# Patient Record
Sex: Female | Born: 1935 | Race: Black or African American | Hispanic: No | Smoking: Current every day smoker
Health system: Southern US, Community
[De-identification: ages and names within clinical notes are randomized; demographics above are authoritative.]

## PROBLEM LIST (undated history)

## (undated) DIAGNOSIS — N390 Urinary tract infection, site not specified: Secondary | ICD-10-CM

## (undated) DIAGNOSIS — E538 Deficiency of other specified B group vitamins: Secondary | ICD-10-CM

## (undated) DIAGNOSIS — F0150 Vascular dementia without behavioral disturbance: Secondary | ICD-10-CM

## (undated) DIAGNOSIS — D510 Vitamin B12 deficiency anemia due to intrinsic factor deficiency: Secondary | ICD-10-CM

## (undated) DIAGNOSIS — M169 Osteoarthritis of hip, unspecified: Secondary | ICD-10-CM

## (undated) DIAGNOSIS — M25569 Pain in unspecified knee: Secondary | ICD-10-CM

## (undated) DIAGNOSIS — F0280 Dementia in other diseases classified elsewhere without behavioral disturbance: Secondary | ICD-10-CM

## (undated) DIAGNOSIS — E559 Vitamin D deficiency, unspecified: Secondary | ICD-10-CM

## (undated) DIAGNOSIS — M899 Disorder of bone, unspecified: Secondary | ICD-10-CM

## (undated) DIAGNOSIS — G609 Hereditary and idiopathic neuropathy, unspecified: Secondary | ICD-10-CM

## (undated) DIAGNOSIS — E7800 Pure hypercholesterolemia, unspecified: Secondary | ICD-10-CM

## (undated) DIAGNOSIS — D72819 Decreased white blood cell count, unspecified: Secondary | ICD-10-CM

## (undated) DIAGNOSIS — E785 Hyperlipidemia, unspecified: Secondary | ICD-10-CM

## (undated) DIAGNOSIS — R42 Dizziness and giddiness: Secondary | ICD-10-CM

## (undated) DIAGNOSIS — G309 Alzheimer's disease, unspecified: Secondary | ICD-10-CM

## (undated) DIAGNOSIS — I6381 Other cerebral infarction due to occlusion or stenosis of small artery: Secondary | ICD-10-CM

## (undated) DIAGNOSIS — Z862 Personal history of diseases of the blood and blood-forming organs and certain disorders involving the immune mechanism: Secondary | ICD-10-CM

## (undated) DIAGNOSIS — M949 Disorder of cartilage, unspecified: Secondary | ICD-10-CM

## (undated) DIAGNOSIS — Z22322 Carrier or suspected carrier of Methicillin resistant Staphylococcus aureus: Secondary | ICD-10-CM

## (undated) HISTORY — DX: Dementia in other diseases classified elsewhere without behavioral disturbance: F02.80

## (undated) HISTORY — DX: Vitamin B12 deficiency anemia due to intrinsic factor deficiency: D51.0

## (undated) HISTORY — DX: Disorder of cartilage, unspecified: M94.9

## (undated) HISTORY — DX: Alzheimer's disease, unspecified: G30.9

## (undated) HISTORY — DX: Pain in unspecified knee: M25.569

## (undated) HISTORY — DX: Osteoarthritis of hip, unspecified: M16.9

## (undated) HISTORY — DX: Carrier or suspected carrier of methicillin resistant Staphylococcus aureus: Z22.322

## (undated) HISTORY — DX: Disorder of bone, unspecified: M89.9

## (undated) HISTORY — DX: Other cerebral infarction due to occlusion or stenosis of small artery: I63.81

## (undated) HISTORY — DX: Vascular dementia without behavioral disturbance: F01.50

## (undated) HISTORY — DX: Decreased white blood cell count, unspecified: D72.819

## (undated) HISTORY — DX: Urinary tract infection, site not specified: N39.0

## (undated) HISTORY — DX: Hereditary and idiopathic neuropathy, unspecified: G60.9

## (undated) HISTORY — PX: ABDOMINAL HYSTERECTOMY: SHX81

## (undated) HISTORY — DX: Hyperlipidemia, unspecified: E78.5

## (undated) HISTORY — DX: Dizziness and giddiness: R42

## (undated) HISTORY — DX: Personal history of diseases of the blood and blood-forming organs and certain disorders involving the immune mechanism: Z86.2

## (undated) HISTORY — DX: Vitamin D deficiency, unspecified: E55.9

## (undated) HISTORY — DX: Deficiency of other specified B group vitamins: E53.8

---

## 1998-11-28 ENCOUNTER — Encounter: Payer: Self-pay | Admitting: Family Medicine

## 1998-11-28 ENCOUNTER — Ambulatory Visit (HOSPITAL_COMMUNITY): Admission: RE | Admit: 1998-11-28 | Discharge: 1998-11-28 | Payer: Self-pay | Admitting: Family Medicine

## 2000-05-13 ENCOUNTER — Emergency Department (HOSPITAL_COMMUNITY): Admission: EM | Admit: 2000-05-13 | Discharge: 2000-05-13 | Payer: Self-pay | Admitting: Emergency Medicine

## 2000-05-23 ENCOUNTER — Encounter: Admission: RE | Admit: 2000-05-23 | Discharge: 2000-05-23 | Payer: Self-pay | Admitting: Family Medicine

## 2000-05-23 ENCOUNTER — Ambulatory Visit (HOSPITAL_COMMUNITY): Admission: RE | Admit: 2000-05-23 | Discharge: 2000-05-23 | Payer: Self-pay | Admitting: Family Medicine

## 2000-06-09 ENCOUNTER — Encounter: Admission: RE | Admit: 2000-06-09 | Discharge: 2000-06-09 | Payer: Self-pay | Admitting: Family Medicine

## 2000-06-12 ENCOUNTER — Encounter: Admission: RE | Admit: 2000-06-12 | Discharge: 2000-06-12 | Payer: Self-pay | Admitting: Family Medicine

## 2000-06-30 ENCOUNTER — Encounter: Admission: RE | Admit: 2000-06-30 | Discharge: 2000-06-30 | Payer: Self-pay | Admitting: Family Medicine

## 2000-09-09 ENCOUNTER — Emergency Department (HOSPITAL_COMMUNITY): Admission: EM | Admit: 2000-09-09 | Discharge: 2000-09-09 | Payer: Self-pay | Admitting: Emergency Medicine

## 2000-09-27 ENCOUNTER — Emergency Department (HOSPITAL_COMMUNITY): Admission: EM | Admit: 2000-09-27 | Discharge: 2000-09-27 | Payer: Self-pay | Admitting: Emergency Medicine

## 2000-10-06 ENCOUNTER — Encounter: Admission: RE | Admit: 2000-10-06 | Discharge: 2000-10-06 | Payer: Self-pay | Admitting: Family Medicine

## 2000-12-15 ENCOUNTER — Encounter: Admission: RE | Admit: 2000-12-15 | Discharge: 2000-12-15 | Payer: Self-pay | Admitting: Family Medicine

## 2000-12-22 ENCOUNTER — Encounter: Admission: RE | Admit: 2000-12-22 | Discharge: 2000-12-22 | Payer: Self-pay | Admitting: Family Medicine

## 2001-03-30 ENCOUNTER — Encounter: Admission: RE | Admit: 2001-03-30 | Discharge: 2001-03-30 | Payer: Self-pay | Admitting: Family Medicine

## 2001-07-30 ENCOUNTER — Encounter: Admission: RE | Admit: 2001-07-30 | Discharge: 2001-07-30 | Payer: Self-pay | Admitting: Family Medicine

## 2002-03-01 ENCOUNTER — Encounter: Admission: RE | Admit: 2002-03-01 | Discharge: 2002-03-01 | Payer: Self-pay | Admitting: Family Medicine

## 2002-03-04 ENCOUNTER — Encounter: Admission: RE | Admit: 2002-03-04 | Discharge: 2002-03-04 | Payer: Self-pay | Admitting: Family Medicine

## 2002-07-06 ENCOUNTER — Encounter: Admission: RE | Admit: 2002-07-06 | Discharge: 2002-07-06 | Payer: Self-pay | Admitting: Sports Medicine

## 2002-11-29 ENCOUNTER — Encounter: Admission: RE | Admit: 2002-11-29 | Discharge: 2002-11-29 | Payer: Self-pay | Admitting: Family Medicine

## 2002-12-06 ENCOUNTER — Encounter: Admission: RE | Admit: 2002-12-06 | Discharge: 2002-12-06 | Payer: Self-pay | Admitting: Family Medicine

## 2003-08-29 ENCOUNTER — Encounter (INDEPENDENT_AMBULATORY_CARE_PROVIDER_SITE_OTHER): Payer: Self-pay | Admitting: *Deleted

## 2003-08-29 LAB — CONVERTED CEMR LAB

## 2003-09-12 ENCOUNTER — Encounter: Admission: RE | Admit: 2003-09-12 | Discharge: 2003-09-12 | Payer: Self-pay | Admitting: Sports Medicine

## 2003-10-19 ENCOUNTER — Ambulatory Visit: Payer: Self-pay | Admitting: Family Medicine

## 2003-11-02 ENCOUNTER — Ambulatory Visit: Payer: Self-pay | Admitting: Family Medicine

## 2003-12-20 ENCOUNTER — Emergency Department (HOSPITAL_COMMUNITY): Admission: EM | Admit: 2003-12-20 | Discharge: 2003-12-20 | Payer: Self-pay | Admitting: Emergency Medicine

## 2004-02-21 ENCOUNTER — Ambulatory Visit: Payer: Self-pay | Admitting: Family Medicine

## 2004-03-20 ENCOUNTER — Ambulatory Visit: Payer: Self-pay | Admitting: Family Medicine

## 2004-03-26 ENCOUNTER — Encounter: Admission: RE | Admit: 2004-03-26 | Discharge: 2004-03-26 | Payer: Self-pay | Admitting: Sports Medicine

## 2004-04-20 ENCOUNTER — Ambulatory Visit: Payer: Self-pay | Admitting: Family Medicine

## 2004-11-24 ENCOUNTER — Encounter: Payer: Self-pay | Admitting: Emergency Medicine

## 2004-11-24 ENCOUNTER — Inpatient Hospital Stay (HOSPITAL_COMMUNITY): Admission: AD | Admit: 2004-11-24 | Discharge: 2004-11-27 | Payer: Self-pay | Admitting: Family Medicine

## 2004-11-24 ENCOUNTER — Ambulatory Visit: Payer: Self-pay | Admitting: Family Medicine

## 2004-12-06 ENCOUNTER — Ambulatory Visit: Payer: Self-pay | Admitting: Family Medicine

## 2005-03-15 ENCOUNTER — Ambulatory Visit: Payer: Self-pay | Admitting: Sports Medicine

## 2005-04-02 ENCOUNTER — Ambulatory Visit: Payer: Self-pay | Admitting: Family Medicine

## 2005-04-08 ENCOUNTER — Emergency Department (HOSPITAL_COMMUNITY): Admission: EM | Admit: 2005-04-08 | Discharge: 2005-04-08 | Payer: Self-pay | Admitting: Pediatrics

## 2005-04-09 ENCOUNTER — Ambulatory Visit: Payer: Self-pay | Admitting: Family Medicine

## 2005-04-10 ENCOUNTER — Encounter: Admission: RE | Admit: 2005-04-10 | Discharge: 2005-04-10 | Payer: Self-pay | Admitting: Family Medicine

## 2005-04-17 ENCOUNTER — Ambulatory Visit: Payer: Self-pay | Admitting: Sports Medicine

## 2006-02-05 ENCOUNTER — Encounter (INDEPENDENT_AMBULATORY_CARE_PROVIDER_SITE_OTHER): Payer: Self-pay | Admitting: *Deleted

## 2006-02-05 ENCOUNTER — Ambulatory Visit: Payer: Self-pay | Admitting: Family Medicine

## 2006-02-21 ENCOUNTER — Ambulatory Visit: Payer: Self-pay | Admitting: Family Medicine

## 2006-03-14 ENCOUNTER — Ambulatory Visit: Payer: Self-pay | Admitting: Family Medicine

## 2006-03-14 ENCOUNTER — Encounter: Payer: Self-pay | Admitting: Family Medicine

## 2006-03-14 LAB — CONVERTED CEMR LAB
ALT: 8 units/L (ref 0–35)
AST: 17 units/L (ref 0–37)
Albumin: 4.7 g/dL (ref 3.5–5.2)
Alkaline Phosphatase: 62 units/L (ref 39–117)
BUN: 13 mg/dL (ref 6–23)
CO2: 27 meq/L (ref 19–32)
Calcium: 9.5 mg/dL (ref 8.4–10.5)
Chloride: 104 meq/L (ref 96–112)
Cholesterol: 303 mg/dL — ABNORMAL HIGH (ref 0–200)
Creatinine, Ser: 0.91 mg/dL (ref 0.40–1.20)
Glucose, Bld: 80 mg/dL (ref 70–99)
HDL: 68 mg/dL (ref >39)
LDL Cholesterol: 184 mg/dL — ABNORMAL HIGH (ref 0–99)
Potassium: 4.6 meq/L (ref 3.5–5.3)
Sodium: 141 meq/L (ref 135–145)
Total Bilirubin: 0.5 mg/dL (ref 0.3–1.2)
Total CHOL/HDL Ratio: 4.5
Total Protein: 7.1 g/dL (ref 6.0–8.3)
Triglycerides: 256 mg/dL — ABNORMAL HIGH (ref <150)
VLDL: 51 mg/dL — ABNORMAL HIGH (ref 0–40)

## 2006-03-27 DIAGNOSIS — M810 Age-related osteoporosis without current pathological fracture: Secondary | ICD-10-CM

## 2006-03-27 DIAGNOSIS — M899 Disorder of bone, unspecified: Secondary | ICD-10-CM

## 2006-03-27 DIAGNOSIS — E785 Hyperlipidemia, unspecified: Secondary | ICD-10-CM | POA: Insufficient documentation

## 2006-03-27 DIAGNOSIS — G609 Hereditary and idiopathic neuropathy, unspecified: Secondary | ICD-10-CM | POA: Insufficient documentation

## 2006-03-27 DIAGNOSIS — M949 Disorder of cartilage, unspecified: Secondary | ICD-10-CM

## 2006-03-27 DIAGNOSIS — F17200 Nicotine dependence, unspecified, uncomplicated: Secondary | ICD-10-CM | POA: Insufficient documentation

## 2006-03-27 HISTORY — DX: Age-related osteoporosis without current pathological fracture: M81.0

## 2006-03-27 HISTORY — DX: Hereditary and idiopathic neuropathy, unspecified: G60.9

## 2006-03-27 HISTORY — DX: Hyperlipidemia, unspecified: E78.5

## 2006-03-27 HISTORY — DX: Disorder of bone, unspecified: M89.9

## 2006-03-28 ENCOUNTER — Encounter (INDEPENDENT_AMBULATORY_CARE_PROVIDER_SITE_OTHER): Payer: Self-pay | Admitting: *Deleted

## 2006-04-10 ENCOUNTER — Encounter: Payer: Self-pay | Admitting: Family Medicine

## 2006-04-16 ENCOUNTER — Telehealth: Payer: Self-pay | Admitting: Family Medicine

## 2006-09-18 ENCOUNTER — Telehealth (INDEPENDENT_AMBULATORY_CARE_PROVIDER_SITE_OTHER): Payer: Self-pay | Admitting: *Deleted

## 2006-09-25 ENCOUNTER — Ambulatory Visit: Payer: Self-pay | Admitting: Family Medicine

## 2007-03-11 ENCOUNTER — Ambulatory Visit: Payer: Self-pay | Admitting: Family Medicine

## 2007-05-27 ENCOUNTER — Ambulatory Visit: Payer: Self-pay | Admitting: Family Medicine

## 2007-05-27 LAB — CONVERTED CEMR LAB
Bilirubin Urine: NEGATIVE
Blood in Urine, dipstick: NEGATIVE
Glucose, Urine, Semiquant: NEGATIVE
Ketones, urine, test strip: NEGATIVE
Nitrite: POSITIVE
Protein, U semiquant: NEGATIVE
Specific Gravity, Urine: <1.005
Urobilinogen, UA: 0.2
WBC Urine, dipstick: NEGATIVE
pH: 5.5

## 2007-05-28 ENCOUNTER — Encounter: Payer: Self-pay | Admitting: Family Medicine

## 2007-08-25 ENCOUNTER — Telehealth: Payer: Self-pay | Admitting: *Deleted

## 2007-08-25 ENCOUNTER — Ambulatory Visit: Payer: Self-pay | Admitting: Family Medicine

## 2007-09-01 ENCOUNTER — Telehealth: Payer: Self-pay | Admitting: Family Medicine

## 2007-09-14 ENCOUNTER — Encounter: Payer: Self-pay | Admitting: Family Medicine

## 2007-09-14 ENCOUNTER — Ambulatory Visit: Payer: Self-pay | Admitting: Family Medicine

## 2007-09-14 LAB — CONVERTED CEMR LAB
Bilirubin Urine: NEGATIVE
Blood in Urine, dipstick: NEGATIVE
Glucose, Urine, Semiquant: NEGATIVE
Ketones, urine, test strip: NEGATIVE
Nitrite: POSITIVE
Protein, U semiquant: NEGATIVE
Specific Gravity, Urine: 1.025
Urobilinogen, UA: 0.2
pH: 5.5

## 2007-09-17 ENCOUNTER — Ambulatory Visit: Payer: Self-pay | Admitting: Family Medicine

## 2007-09-17 ENCOUNTER — Encounter: Payer: Self-pay | Admitting: Family Medicine

## 2007-09-18 ENCOUNTER — Encounter: Payer: Self-pay | Admitting: Family Medicine

## 2007-09-18 DIAGNOSIS — D539 Nutritional anemia, unspecified: Secondary | ICD-10-CM | POA: Insufficient documentation

## 2007-09-18 LAB — CONVERTED CEMR LAB
ALT: 9 units/L (ref 0–35)
AST: 20 units/L (ref 0–37)
Albumin: 4.4 g/dL (ref 3.5–5.2)
Alkaline Phosphatase: 52 units/L (ref 39–117)
BUN: 13 mg/dL (ref 6–23)
CO2: 24 meq/L (ref 19–32)
Calcium: 9.4 mg/dL (ref 8.4–10.5)
Chloride: 104 meq/L (ref 96–112)
Cholesterol: 260 mg/dL — ABNORMAL HIGH (ref 0–200)
Creatinine, Ser: 0.81 mg/dL (ref 0.40–1.20)
Glucose, Bld: 88 mg/dL (ref 70–99)
HCT: 40.8 % (ref 36.0–46.0)
HDL: 60 mg/dL (ref >39)
Hemoglobin: 13.1 g/dL (ref 12.0–15.0)
LDL Cholesterol: 166 mg/dL — ABNORMAL HIGH (ref 0–99)
MCHC: 32.1 g/dL (ref 30.0–36.0)
MCV: 103.6 fL — ABNORMAL HIGH (ref 78.0–100.0)
Platelets: 201 10*3/uL (ref 150–400)
Potassium: 4.8 meq/L (ref 3.5–5.3)
RBC: 3.94 M/uL (ref 3.87–5.11)
RDW: 13 % (ref 11.5–15.5)
Sodium: 139 meq/L (ref 135–145)
TSH: 1.437 microintl units/mL (ref 0.350–4.50)
Total Bilirubin: 0.6 mg/dL (ref 0.3–1.2)
Total CHOL/HDL Ratio: 4.3
Total Protein: 7 g/dL (ref 6.0–8.3)
Triglycerides: 170 mg/dL — ABNORMAL HIGH (ref <150)
VLDL: 34 mg/dL (ref 0–40)
WBC: 3.3 10*3/uL — ABNORMAL LOW (ref 4.0–10.5)

## 2007-09-21 ENCOUNTER — Telehealth: Payer: Self-pay | Admitting: Family Medicine

## 2007-09-21 ENCOUNTER — Encounter: Payer: Self-pay | Admitting: Family Medicine

## 2007-09-21 DIAGNOSIS — D510 Vitamin B12 deficiency anemia due to intrinsic factor deficiency: Secondary | ICD-10-CM

## 2007-09-21 HISTORY — DX: Vitamin B12 deficiency anemia due to intrinsic factor deficiency: D51.0

## 2007-09-21 LAB — CONVERTED CEMR LAB
Folate: >20 ng/mL
Vitamin B-12: 118 pg/mL — ABNORMAL LOW (ref 211–911)

## 2007-09-22 ENCOUNTER — Encounter: Payer: Self-pay | Admitting: Family Medicine

## 2007-09-23 ENCOUNTER — Ambulatory Visit: Payer: Self-pay | Admitting: Family Medicine

## 2007-09-23 ENCOUNTER — Encounter: Payer: Self-pay | Admitting: Family Medicine

## 2007-09-28 LAB — CONVERTED CEMR LAB: Vitamin B-12: 73 pg/mL — ABNORMAL LOW (ref 211–911)

## 2007-10-07 ENCOUNTER — Ambulatory Visit: Payer: Self-pay | Admitting: Family Medicine

## 2007-10-09 ENCOUNTER — Ambulatory Visit: Payer: Self-pay | Admitting: Family Medicine

## 2007-10-12 ENCOUNTER — Encounter: Payer: Self-pay | Admitting: Family Medicine

## 2007-10-12 ENCOUNTER — Ambulatory Visit: Payer: Self-pay | Admitting: Family Medicine

## 2007-11-03 ENCOUNTER — Ambulatory Visit: Payer: Self-pay | Admitting: Family Medicine

## 2007-12-04 ENCOUNTER — Ambulatory Visit: Payer: Self-pay | Admitting: Family Medicine

## 2008-01-04 ENCOUNTER — Ambulatory Visit: Payer: Self-pay | Admitting: Family Medicine

## 2008-03-22 ENCOUNTER — Ambulatory Visit: Payer: Self-pay | Admitting: Family Medicine

## 2008-04-28 ENCOUNTER — Ambulatory Visit: Payer: Self-pay | Admitting: Family Medicine

## 2008-05-30 ENCOUNTER — Ambulatory Visit: Payer: Self-pay | Admitting: Family Medicine

## 2008-06-15 ENCOUNTER — Telehealth: Payer: Self-pay | Admitting: Family Medicine

## 2008-07-01 ENCOUNTER — Ambulatory Visit: Payer: Self-pay | Admitting: Family Medicine

## 2008-08-02 ENCOUNTER — Ambulatory Visit: Payer: Self-pay | Admitting: Family Medicine

## 2008-08-31 ENCOUNTER — Ambulatory Visit: Payer: Self-pay | Admitting: Family Medicine

## 2008-10-05 ENCOUNTER — Ambulatory Visit: Payer: Self-pay | Admitting: Family Medicine

## 2008-10-26 ENCOUNTER — Ambulatory Visit: Payer: Self-pay | Admitting: Family Medicine

## 2008-11-01 ENCOUNTER — Ambulatory Visit: Payer: Self-pay | Admitting: Family Medicine

## 2008-11-16 ENCOUNTER — Ambulatory Visit: Payer: Self-pay | Admitting: Family Medicine

## 2008-12-01 ENCOUNTER — Ambulatory Visit: Payer: Self-pay | Admitting: Family Medicine

## 2008-12-06 ENCOUNTER — Encounter: Admission: RE | Admit: 2008-12-06 | Discharge: 2008-12-06 | Payer: Self-pay | Admitting: *Deleted

## 2008-12-14 ENCOUNTER — Encounter: Payer: Self-pay | Admitting: Family Medicine

## 2008-12-14 ENCOUNTER — Ambulatory Visit: Payer: Self-pay | Admitting: Family Medicine

## 2008-12-14 LAB — CONVERTED CEMR LAB
ALT: 10 units/L (ref 0–35)
AST: 17 units/L (ref 0–37)
Albumin: 4.3 g/dL (ref 3.5–5.2)
Alkaline Phosphatase: 52 units/L (ref 39–117)
BUN: 12 mg/dL (ref 6–23)
CO2: 23 meq/L (ref 19–32)
Calcium: 9.2 mg/dL (ref 8.4–10.5)
Chloride: 105 meq/L (ref 96–112)
Cholesterol: 215 mg/dL — ABNORMAL HIGH (ref 0–200)
Creatinine, Ser: 0.82 mg/dL (ref 0.40–1.20)
Glucose, Bld: 87 mg/dL (ref 70–99)
HCT: 42.7 % (ref 36.0–46.0)
HDL: 60 mg/dL (ref >39)
Hemoglobin: 13.6 g/dL (ref 12.0–15.0)
LDL Cholesterol: 121 mg/dL — ABNORMAL HIGH (ref 0–99)
MCHC: 31.9 g/dL (ref 30.0–36.0)
MCV: 88.4 fL (ref 78.0–100.0)
Platelets: 192 10*3/uL (ref 150–400)
Potassium: 4.5 meq/L (ref 3.5–5.3)
RBC: 4.83 M/uL (ref 3.87–5.11)
RDW: 12.6 % (ref 11.5–15.5)
Sodium: 141 meq/L (ref 135–145)
Total Bilirubin: 0.5 mg/dL (ref 0.3–1.2)
Total CHOL/HDL Ratio: 3.6
Total Protein: 6.7 g/dL (ref 6.0–8.3)
Triglycerides: 171 mg/dL — ABNORMAL HIGH (ref <150)
VLDL: 34 mg/dL (ref 0–40)
WBC: 3.2 10*3/uL — ABNORMAL LOW (ref 4.0–10.5)

## 2009-01-02 ENCOUNTER — Ambulatory Visit: Payer: Self-pay | Admitting: Family Medicine

## 2009-01-24 ENCOUNTER — Emergency Department (HOSPITAL_COMMUNITY): Admission: EM | Admit: 2009-01-24 | Discharge: 2009-01-24 | Payer: Self-pay | Admitting: Emergency Medicine

## 2009-02-02 ENCOUNTER — Ambulatory Visit: Payer: Self-pay | Admitting: Family Medicine

## 2009-02-02 DIAGNOSIS — M25569 Pain in unspecified knee: Secondary | ICD-10-CM | POA: Insufficient documentation

## 2009-02-02 HISTORY — DX: Pain in unspecified knee: M25.569

## 2009-03-13 ENCOUNTER — Ambulatory Visit: Payer: Self-pay

## 2009-03-22 ENCOUNTER — Ambulatory Visit: Payer: Self-pay | Admitting: Family Medicine

## 2009-03-22 DIAGNOSIS — M25519 Pain in unspecified shoulder: Secondary | ICD-10-CM | POA: Insufficient documentation

## 2009-04-05 ENCOUNTER — Ambulatory Visit: Payer: Self-pay | Admitting: Family Medicine

## 2009-10-24 ENCOUNTER — Ambulatory Visit: Payer: Self-pay | Admitting: Family Medicine

## 2009-12-06 ENCOUNTER — Encounter: Payer: Self-pay | Admitting: Family Medicine

## 2010-02-18 ENCOUNTER — Encounter: Payer: Self-pay | Admitting: Family Medicine

## 2010-03-01 NOTE — Progress Notes (Signed)
Summary: Request to speak with MD  Phone Note Call from Patient Call back at [phone]   Caller: shelby /daughter Reason for Call: Talk to Doctor Summary of Call: requesting to speak with MD, sts pt was scheduled today @ 10:30 but showed up early and pt suffers from short term memory loss, sts pt forgot to call daughter and let her know the time. Daughter was suppose to come with pt so she could discuss some things with MD and now wants MD to call her. Initial call taken by: Knox Royalty,  August 25, 2007 3:44 PM  Follow-up for Phone Call        Please notify pt's daughter that I'm out of the office until next week.  I will be glad to call her at that time.  She is also able to schedule appt to come back with her mom if she would rather talk face to face Follow-up by: Neena Rhymes  MD,  August 27, 2007 8:30 AM  Additional Follow-up for Phone Call Additional follow up Details #1::        CALLED AND LMAM TO SCHED. APPT WITH DR.TABORI Additional Follow-up by: Arlyss Repress CMA,,  August 27, 2007 5:01 PM

## 2010-03-01 NOTE — Assessment & Plan Note (Signed)
Summary: vit b12 inj/AC  Nurse Visit     Allergies: 1)  * Sulfa (Sulfonamides) Group     Medication Administration  Injection # 1:    Medication: Vit B12 1000 mcg    Diagnosis: ANEMIA, PERNICIOUS (ICD-281.0)    Route: IM    Site: L deltoid    Exp Date: 12/2009    Lot #: [id number]    Mfr: American Regent    Patient tolerated injection without complications    Given by: Jacki Cones RN (July 01, 2008 9:06 AM)  Orders Added: 1)  Vit B12 1000 mcg [J3420] 2)  Est Level 1- [facility] [[id number]]      Medication Administration  Injection # 1:    Medication: Vit B12 1000 mcg    Diagnosis: ANEMIA, PERNICIOUS (ICD-281.0)    Route: IM    Site: L deltoid    Exp Date: 12/2009    Lot #: [id number]    Mfr: American Regent    Patient tolerated injection without complications    Given by: Jacki Cones RN (July 01, 2008 9:06 AM)  Orders Added: 1)  Vit B12 1000 mcg [J3420] 2)  Est Level 1- [facility] [[id number]]

## 2010-03-01 NOTE — Assessment & Plan Note (Signed)
Summary: F/U DIZZINESS/PT OF DR. MAYANS/[facility]   Vital Signs:  Patient profile:   77 year old female Weight:      138.7 pounds Temp:     97.6 degrees F oral Pulse rate:   80 / minute BP sitting:   120 / 80  (right arm)  Vitals Entered By: Arlyss Repress CMA, (March 22, 2009 3:01 PM) CC: f/up dizziness. some better. c/o left shoulder pain radiating into left elbow x 1 week. Is Patient Diabetic? No Pain Assessment Patient in pain? yes     Location: left shoulder Intensity: 10 Onset of pain  x 1 week   Primary Care Provider:  Ardeen Garland  MD  CC:  f/up dizziness. some better. c/o left shoulder pain radiating into left elbow x 1 week.Marland Kitchen  History of Present Illness: cc: dizziness/vertigo  77 y/o F here for   -Vertigo symptoms x 2 wks.  Last time she sufferred from this was int the 1990s.  Taking Meclizine with total relief of dizziness.    -Left shoulder and arm pain x 2 wks.  Denies trauma.  Larey Seat on her bottoms in Dec 2010.  Shoulder did not hurt then.  Most painful in joint.  Dull pain.  No chest pain.  no shortness of breath.  No neck or jaw pain. Took Advil liquid gel which helped.    Habits & Providers  Alcohol-Tobacco-Diet     Tobacco Status: current     Tobacco Counseling: to quit use of tobacco products  Allergies: 1)  * Sulfa (Sulfonamides) Group  Past History:  Past Medical History: Last updated: 03/27/2006 cataract removal 6/03, hysterectomy 1971, started fish oil capusles 4-06  Past Surgical History: Last updated: 03/27/2006 lipid panel 2/08: TC=303, TG=256, HDL=68, LDL=184 - 03/17/2006, MRI-->nl 1/03 - 07/30/2001  Review of Systems       per hpi  Physical Exam  General:  Well-developed,well-nourished,in no acute distress; alert,appropriate and cooperative throughout examination Head:  normocephalic, atraumatic, no abnormalities observed, and no abnormalities palpated.   Neck:  No deformities, masses, or tenderness noted.   Shoulder/Elbow  Exam  Skin:    Intact, no scars, lesions, rashes, cafe au lait spots or bruising.    Inspection:    Inspection is normal.    Palpation:    tenderness L-AC:   Shoulder Exam:    Left:    Inspection:  Normal    Palpation:  Normal    Stability:  stable    Tenderness:  left AC joint    Swelling:  no    Erythema:  no    Full ROM of left shoulder/arm.  Tenderness from internal and external rotation.     Impression & Recommendations:  Problem # 1:  PAIN IN JOINT, SHOULDER REGION (ICD-719.41) Assessment New Left shoulder pain is most likely from bursitis vs frozen shoulder vs rotator cuff tear.  We discussed treatment options which include antiinflammatory, steroid injection, and surgery.  I do not feel that surgery is a good option and pt agreed.  Pt also agreed to try antiinflammatory.  Pt has not history of renal insuff so will try naproxen since it is longer acting than advil.  Will rtc in 2 wks if not better and we can try steroid injection. Pt amendable to this plan.  Her updated medication list for this problem includes:    Naproxen 250 Mg Tabs (Naproxen) .Marland Kitchen... 1 tab by mouth two times a day for pain  Orders: [facility]- Est Level  3 (91478)  Complete  Medication List: 1)  Hydrocortisone 2.5 % Oint (Hydrocortisone) .... Apply to affected area twice daily as needed for itching.  disp 1 large container 2)  Simvastatin 40 Mg Tabs (Simvastatin) .... One qhs 3)  Cobal-1000 1000 Mcg/ml Soln (Cyanocobalamin) .... Daily x5 then weekely x4, then monthly 4)  Doxycycline Hyclate 100 Mg Caps (Doxycycline hyclate) .Marland Kitchen.. 1 tab by mouth two times a day for 14  day. 5)  Naproxen 250 Mg Tabs (Naproxen) .Marland Kitchen.. 1 tab by mouth two times a day for pain  Patient Instructions: 1)  Please schedule a follow-up appointment in 2-3 weeks with Dr Janalyn Harder if your shoulder is still hurting.  2)  I think that your shoulder pain is due to frozen shoulder or rotator cuff strain.  I have prescribed Naproxen for you for this  pain.  You can take it twice a day. 3)  If after two weeks you still have pain we can discuss a steroid injection.  Prescriptions: NAPROXEN 250 MG TABS (NAPROXEN) 1 tab by mouth two times a day for pain  #60 x 2   Entered and Authorized by:   Angeline Slim MD   Signed by:   Angeline Slim MD on 03/22/2009   Method used:   Print then Give to Patient   RxID:   [id number] NAPROXEN 250 MG TABS (NAPROXEN) 1 tab by mouth two times a day for pain  #60 x 2   Entered and Authorized by:   Angeline Slim MD   Signed by:   Angeline Slim MD on 03/22/2009   Method used:   [facility]        [facility]  [address] [phone][facility] (retail)       [address]       [place], [place]  [id number]       Ph: [phone] or [phone]       Fax: [phone]   RxID:   [id number]

## 2010-03-01 NOTE — Progress Notes (Signed)
Summary: DISCUSSION RE MS Fritsche  Joann [name]'s daughter Joann [name] called to discuss some issues with you regarding [name]'s memory condition.  Joann [name] was  suppose to have her [name] call her when she arrive at last appt so that she could come and be in the session with her.  Daughter is very concerned about [name] developing early onset of Alzheimer's. Would like for you to call her to make arrangements to see both parties together with you to discuss her concerns.  Her number is [phone].  Home after 2:00 pm [facility]  September 01, 2007 9:21 AM  Spoke w/ Joann [name] (daughter).  She has concerns about [name]'s memory and feels like [name] isn't taking her concerns seriously.  Agree with daughter's concerns- pt seeming to have difficulty providing history and recent info where as before this was not a problem.  Notified her of geriatric appt on Thurs 8/20 at 9:00am.  Daughter appreciative and says she will be present

## 2010-03-01 NOTE — Assessment & Plan Note (Signed)
Summary: routine visit/eo   Vital Signs:  Patient Profile:   77 Years Old Female Weight:      138.2 pounds Temp:     98.3 degrees F Pulse rate:   78 / minute BP supine:   117 / 74  (right arm)  Pt. in pain?   no  Vitals Entered By: Rachael Fee RN (May 27, 2007 3:46 PM)                  Chief Complaint:  C/O OF FOUL DARK URINE X 1 WEEK and DENIES BURNING OR FREQUENCY.  History of Present Illness: 77 yo woman who c/o 1 week of dark urine which is foul smelling.  No dysuria, blood, frequency.  + urgency.  Thinks she has a 'uti'.  No suprapubic pain, CVA tenderness, fever or chills  Also c/o 'itchy spots' in the R axillary line.  No change in laundry detergent, soaps, no bug bites or rashes.  No burning or pain.    Current Allergies: * SULFA (SULFONAMIDES) GROUP   Social History:    Reviewed history from 03/27/2006 and no changes required:       Lives with a friend in [place], 2 children.  Smoked since 51 yo. Walks at least a mile every day. No etoh. Active in church.    Review of Systems      See HPI   Physical Exam  General:     Well-developed,well-nourished,in no acute distress; alert,appropriate and cooperative throughout examination Head:     Normocephalic and atraumatic without obvious abnormalities. No apparent alopecia or balding. Chest Wall:     1 sm scabbed excoriated area and 3 small hyperpigmented SKs Lungs:     Normal respiratory effort, chest expands symmetrically. Lungs slightly decreased breath sounds, no crackles or wheezes. Heart:     Normal rate and regular rhythm. S1 and S2 normal without gallop, murmur, click, rub or other extra sounds. Abdomen:     Bowel sounds positive,abdomen soft and non-tender without masses, organomegaly or hernias noted.  No CVA or suprapubic tenderness    Impression & Recommendations:  Problem # 1:  URINARY URGENCY (JXB-147.82) Assessment: New Pt's UA (+) for nitrite only.  Lots of bacteria on micro.  Will treat  as UTI w/ Keflex and send urine for cx to determine in abx need to be changed.  Advised increased fluid intake.  Pt in agreement w/ plan. Orders: Urinalysis-[facility] (00000) Urine Culture-[facility] ([id number]) [facility]- Est Level  3 (78469)   Problem # 2:  SEBORRHEIC KERATOSIS (ICD-702.19) Assessment: New Pt w/ multiple small SKs in R midaxillary line.  Not large enough to shave.  Suggested hydrocortisone cream to affected area to help w/ itching.  Pt expresses understanding and is in agreement w/ this plan. Orders: [facility]- Est Level  3 (99213)   Complete Medication List: 1)  Lidoderm 5 % Ptch (Lidocaine) .... Apply 1 patch to skin every four hours 2)  Ultram 50 Mg Tabs (Tramadol hcl) .Marland Kitchen.. 1 tab q6 as needed for pain 3)  Lidoderm 5 % Ptch (Lidocaine) .... Apply 1 patch for up to 12 hours daily, cut to appropriate size- dispense 1 box 4)  Doxycycline Hyclate 100 Mg Tabs (Doxycycline hyclate) .Marland Kitchen.. 1 by mouth two times a day for 10 days 5)  Prednisone 20 Mg Tabs (Prednisone) .... 2 by mouth daily for 10 days 6)  Keflex 250 Mg Caps (Cephalexin) .Marland Kitchen.. 1 tab by mouth three times a day x 7 days   Patient  Instructions: 1)  Follow up in 3 months 2)  Take your antibiotic 3 times a day for 7 days 3)  Drink plenty of fluids 4)  Apply Hydrocortisone cream to your itchy spots twice a day    Prescriptions: KEFLEX 250 MG  CAPS (CEPHALEXIN) 1 tab by mouth three times a day x 7 days  #21 x 0   Entered and Authorized by:   Neena Rhymes  MD   Signed by:   Neena Rhymes  MD on 05/27/2007   Method used:   Electronically sent to ...       [facility]  [address] [address] [phone]*       [address]       [place], [place]  [address]       Ph: [phone] or [phone]       Fax: [phone]   RxID:   [id number]  ] Laboratory Results   Urine Tests  Date/Time Received: May 27, 2007 4:19  PM  Date/Time Reported: May 27, 2007 4:47 PM   Routine Urinalysis     Color: yellow Appearance: Clear Glucose: negative   (Normal Range: Negative) Bilirubin: negative   (Normal Range: Negative) Ketone: negative   (Normal Range: Negative) Spec. Gravity: <1.005   (Normal Range: 1.003-1.035) Blood: negative   (Normal Range: Negative) pH: 5.5   (Normal Range: 5.0-8.0) Protein: negative   (Normal Range: Negative) Urobilinogen: 0.2   (Normal Range: 0-1) Nitrite: positive   (Normal Range: Negative) Leukocyte Esterace: negative   (Normal Range: Negative)  Urine Microscopic Bacteria/HPF: 3+ Epithelial/HPF: rare    Comments: cultured ..............test performed by......Marland KitchenBonnie A. [place], [place] (ASCP)      Appended Document: Orders Update    Clinical Lists Changes  Problems: Added new problem of UTI (ICD-599.0) Orders: Added new Test order of Miscellaneous Lab Charge-[facility] [phone]) - Signed

## 2010-03-01 NOTE — Assessment & Plan Note (Signed)
Summary: weight loss, feeling fatigued/ACM  Medications Added LIDODERM 5 % PTCH (LIDOCAINE) Apply 1 patch to skin every four hours ULTRAM 50 MG  TABS (TRAMADOL HCL) 1 tab Q6 as needed for pain LIDODERM 5 %  PTCH (LIDOCAINE) Apply 1 patch for up to 12 hours daily, cut to appropriate size- dispense 1 box      Allergies Added: * SULFA (SULFONAMIDES) GROUP  Vital Signs:  Patient Profile:   77 Years Old Female Weight:      134 pounds Temp:     97.9 degrees F Pulse rate:   75 / minute BP sitting:   115 / 76  (left arm)  Pt. in pain?   yes    Location:   right side    Intensity:   8    Type:       throbbing  Vitals Entered ByJacki Cones RN (September 25, 2006 8:41 AM)                  Chief Complaint:  losing weight and feeling dizzy.  History of Present Illness: Pt is 77 yo woman who returns to the office today after a recent worsening of her hypersthesia on L side.  Pt states pain is mostly cutaneous, occasionally deep, but doesn't effect BMs or cause N/V.  Pt has had relief w/ topical Lidoderm patches and cutaneous lidocaine injxns in the past.  Pt thinks Ultram also provided some relief but can't remember at this time.  Pt also knows she's been losing weight but wants to know how much.  She feels it may be due to the fact she walks so much- often multiple miles/day.  States her appetite is still very good, 'I eat like a horse'.  Current Allergies: * SULFA (SULFONAMIDES) GROUP   Family History:    Reviewed history from 03/27/2006 and no changes required:       brother died of lung CA 1978/06/18, Father died of MI,, mother died of kidney failure, sister died of stomach CA at 51, sister with breast ca  Social History:    Reviewed history from 03/27/2006 and no changes required:       Lives with a friend in [place], 2 children.  Smoked since 67 yo. Walks at least a mile every day. No etoh. Active in church.   Risk Factors:  Tobacco use:  current    Cigarettes:  Yes -- 1 pack per  week pack(s) per day   Review of Systems      See HPI   Physical Exam  General:     Well-developed,well-nourished,in no acute distress; alert,appropriate and cooperative throughout examination Lungs:     Normal respiratory effort, chest expands symmetrically. Lungs are clear to auscultation, no crackles or wheezes. Heart:     Normal rate and regular rhythm. S1 and S2 normal without gallop, murmur, click, rub or other extra sounds. Abdomen:     Soft, NT/ND, +BS, no hepatosplenomegaly.  No rashes or vesicles present on skin. Pulses:     +2 DP and radial Extremities:     No C/C/E    Impression & Recommendations:  Problem # 1:  ABDOMINAL PAIN (ICD-789.00) Assessment: Unchanged Pt w/ hx of parasthesias/hypersthesias of L flank/upper quadrant.  These pains have been relieved by Lidoderm patches and cutaneous lidocaine injxns in the past.  Pt describes her recent pain as similar to those past episodes.  No red flags on hx or exam.  Will treat conservatively w/ Lidoderm patches and Ultram  as needed.  Pt advised that if eating habits or bowel habits change she is to come back sooner.  Pt in agreement w/ this plan. Orders: [facility]- Est Level  3 (02725)   Problem # 2:  WEIGHT LOSS (ICD-783.21) Assessment: New Pt has lost 6 lbs in last 6 months.  Reassured her that this was not abnormal and that I am encouraged by her healthy appetite.  She denies melena or BRBPR but never followed up w/ Dr Bosie Clos for colonoscopy.  Pt's weight loss is WNL and I am not overly concerned at this point and I told her that we would follow this issue closely.  She does not have routine cancer screenings, such as mammos, pap smears, or colonoscopy and she continues to smoke 1ppd.  Pt in agreement w/ close follow up. Orders: [facility]- Est Level  3 ([id number])   Problem # 3:  TOBACCO DEPENDENCE (ICD-305.1) Assessment: Unchanged Pt has no interest in quitting at this time. Orders: [facility]- Est Level  3 (99213)   Complete  Medication List: 1)  Lidoderm 5 % Ptch (Lidocaine) .... Apply 1 patch to skin every four hours 2)  Ultram 50 Mg Tabs (Tramadol hcl) .Marland Kitchen.. 1 tab q6 as needed for pain 3)  Lidoderm 5 % Ptch (Lidocaine) .... Apply 1 patch for up to 12 hours daily, cut to appropriate size- dispense 1 box   Patient Instructions: 1)  Please schedule a follow-up appointment in 2 months. 2)  Use the Lidoderm patches and take the Ultram as needed for your side pain 3)  If your pain worsens or you are unable to eat please call the office 4)  Continue to stay active by walking 5)  Take your dizziness medication as needed 6)  Call with questions or concerns    Prescriptions: LIDODERM 5 %  PTCH (LIDOCAINE) Apply 1 patch for up to 12 hours daily, cut to appropriate size- dispense 1 box  #1 x 3   Entered and Authorized by:   Neena Rhymes  MD   Signed by:   Neena Rhymes  MD on 09/25/2006   Method used:   Electronically sent to ...       [facility] [phone]*       [address]       [place], [place]  [address]       Ph: [phone] or [phone]       Fax: [phone]   RxID:   [id number] ULTRAM 50 MG  TABS (TRAMADOL HCL) 1 tab Q6 as needed for pain  #90 x 1   Entered and Authorized by:   Neena Rhymes  MD   Signed by:   Neena Rhymes  MD on 09/25/2006   Method used:   Electronically sent to ...       [facility]  [address] [phone]*       [address]       [place], [place]  [address]       Ph: [phone] or [phone]       Fax: [phone]   RxID:   [id number]

## 2010-03-01 NOTE — Assessment & Plan Note (Signed)
Summary: b12/eo  Nurse Visit   CC: RTC for B12 injection.   Allergies: 1)  * Sulfa (Sulfonamides) Group  Medication Administration  Injection # 1:    Medication: Vit B12 1000 mcg    Diagnosis: ANEMIA, PERNICIOUS (ICD-281.0)    Route: IM    Site: R deltoid    Exp Date: 04/29/2010    Lot #: [id number]    Mfr: American Regent    Patient tolerated injection without complications    Given by: Modesta Messing LPN (August 31, 2008 9:30 AM)  Orders Added: 1)  Vit B12 1000 mcg [J3420]   Medication Administration  Injection # 1:    Medication: Vit B12 1000 mcg    Diagnosis: ANEMIA, PERNICIOUS (ICD-281.0)    Route: IM    Site: R deltoid    Exp Date: 04/29/2010    Lot #: [id number]    Mfr: American Regent    Patient tolerated injection without complications    Given by: Modesta Messing LPN (August 31, 2008 9:30 AM)  Orders Added: 1)  Vit B12 1000 mcg [J3420]

## 2010-03-01 NOTE — Assessment & Plan Note (Signed)
Summary: B12 ing,tcb  Nurse Visit      Allergies: 1)  * Sulfa (Sulfonamides) Group     Medication Administration  Injection # 1:    Medication: Vit B12 1000 mcg    Diagnosis: ANEMIA, PERNICIOUS (ICD-281.0)    Route: IM    Site: R deltoid    Exp Date: 12/2009    Lot #: [id number]    Mfr: American Regent    Patient tolerated injection without complications    Given by: Theresia Lo RN (May 30, 2008 9:36 AM)  Orders Added: 1)  Vit B12 1000 mcg [J3420] 2)  Est Level 1- [facility] [[id number]]      Medication Administration  Injection # 1:    Medication: Vit B12 1000 mcg    Diagnosis: ANEMIA, PERNICIOUS (ICD-281.0)    Route: IM    Site: R deltoid    Exp Date: 12/2009    Lot #: [id number]    Mfr: American Regent    Patient tolerated injection without complications    Given by: Theresia Lo RN (May 30, 2008 9:36 AM)  Orders Added: 1)  Vit B12 1000 mcg [J3420] 2)  Est Level 1- [facility] [11914]

## 2010-03-01 NOTE — Assessment & Plan Note (Signed)
Summary: B12 SHOT/KH  Nurse Visit   Allergies: 1)  * Sulfa (Sulfonamides) Group  Medication Administration  Injection # 1:    Medication: Vit B12 1000 mcg    Diagnosis: ANEMIA, PERNICIOUS (ICD-281.0)    Route: IM    Site: R deltoid    Exp Date: 06/2010    Lot #: [id number]    Mfr: American Regent    Patient tolerated injection without complications    Given by: Theresia Lo RN (December 01, 2008 9:37 AM)  Orders Added: 1)  Vit B12 1000 mcg [J3420] 2)  Admin of Injection (IM/SQ) [91478]   Medication Administration  Injection # 1:    Medication: Vit B12 1000 mcg    Diagnosis: ANEMIA, PERNICIOUS (ICD-281.0)    Route: IM    Site: R deltoid    Exp Date: 06/2010    Lot #: [id number]    Mfr: American Regent    Patient tolerated injection without complications    Given by: Theresia Lo RN (December 01, 2008 9:37 AM)  Orders Added: 1)  Vit B12 1000 mcg [J3420] 2)  Admin of Injection (IM/SQ) [[id number]]

## 2010-03-01 NOTE — Progress Notes (Signed)
Summary: Triage  Phone Note Call from Patient Call back at Home Phone [phone]   Caller: Patient Summary of Call: pt states that she is to come back for B12 shot and was wondering when that was.  Could not tell by notes. Please advise. Initial call taken by: Clydell Hakim,  Jun 15, 2008 4:33 PM  Follow-up for Phone Call        med list has a schedule but it does not match how often she has been coming. message to pcp to find out how often she wants her to have this Follow-up by: Golden Circle RN,  Jun 15, 2008 4:34 PM  Additional Follow-up for Phone Call Additional follow up Details #1::        she should get it monthly. Additional Follow-up by: Lamar Laundry, MD May 20th,201011:50      Appended Document: Triage pt informed. next due 06/30/08

## 2010-03-01 NOTE — Assessment & Plan Note (Signed)
Summary: FU/EO   Vital Signs:  Patient Profile:   77 Years Old Female Height:     66.75 inches Weight:      134.25 pounds Temp:     97.4 degrees F Pulse rate:   67 / minute BP sitting:   114 / 68  Pt. in pain?   no  Vitals Entered By: Dennison Nancy RN (November 03, 2007 11:12 AM)              Is Patient Diabetic? No     Chief Complaint:  Follow up visit.  History of Present Illness: Still complaining of numb fingers, not able to write or do much.  Does not remember how many B12 shots she received.  She did remember that she has injctions at last visit and she does not think that it improved.    Current Allergies: * SULFA (SULFONAMIDES) GROUP    Risk Factors:     Counseled to quit/cut down tobacco use:  yes    Physical Exam  Msk:     Normal strenght of hands, no muscle waisting.    Impression & Recommendations:  Problem # 1:  ANEMIA, PERNICIOUS (ICD-281.0) Return next month Her updated medication list for this problem includes:    Cobal-1000 1000 Mcg/ml Soln (Cyanocobalamin) .Marland Kitchen... Daily x5 then weekely x4, then monthly  Orders: Vit B12 1000 mcg (J3420) [facility]- Est Level  2 (32440)   Complete Medication List: 1)  Hydrocortisone 2.5 % Oint (Hydrocortisone) .... Apply to affected area twice daily as needed for itching.  disp 1 large container 2)  Simvastatin 40 Mg Tabs (Simvastatin) .... One qhs 3)  Cobal-1000 1000 Mcg/ml Soln (Cyanocobalamin) .... Daily x5 then weekely x4, then monthly   Patient Instructions: 1)  One month for B12 shot 2)  Make apt for mammogram on the same day,as it is accross the street.   ]    Medication Administration  Injection # 1:    Medication: Vit B12 1000 mcg    Diagnosis: ANEMIA, PERNICIOUS (ICD-281.0)    Route: IM    Site: R deltoid    Exp Date: 07/28/2009    Lot #: [id number]    Mfr: American Regent    Comments: Instructed to RTC in one month for next injection    Patient tolerated injection without complications  Given by: Dennison Nancy RN (November 03, 2007 11:39 AM)  Orders Added: 1)  Vit B12 1000 mcg [J3420] 2)  [facility]- Est Level  2 [25366]

## 2010-03-01 NOTE — Assessment & Plan Note (Signed)
Summary: F/U VISIT/[facility]   Vital Signs:  Patient profile:   77 year old female Height:      68 inches Weight:      139.38 pounds BMI:     21.27 Temp:     97.5 degrees F oral Pulse rate:   79 / minute BP sitting:   113 / 69  (left arm) CC: check-up, B12   History of Present Illness: Joann [name] comes in today for a check-up.  She is a fairly healthy 77 year female treated with B12 shots for pernicious anemia and zocor for high cholesterol.  Otherwise she is in good health.  She has no complaints today. 1) B12 - MIssed a few shots over the last few months but most recent shot was about 6 weeks ago.  Has been feeling better since starting B12 shots.   2) High Cholesterol - when asked how she was taking her simvastating, patient states she does not take such medicine.  Further discussion revealed she did not know she was to be taking a pill for high cholesterol.  Last checked 6 months ago and LDL was > 180.    Habits & Providers     Tobacco Status: current  Allergies: 1)  * Sulfa (Sulfonamides) Group  Physical Exam  General:  thin, alert, pleasant Lungs:  Normal respiratory effort, chest expands symmetrically. Lungs are clear to auscultation, no crackles or wheezes. Heart:  Normal rate and regular rhythm. S1 and S2 normal without gallop, murmur, click, rub or other extra sounds.   Impression & Recommendations:  Problem # 1:  ANEMIA, PERNICIOUS (ICD-281.0) Assessment Unchanged Continue B12 shots.  May receive one today.  Her updated medication list for this problem includes:    Cobal-1000 1000 Mcg/ml Soln (Cyanocobalamin) .Marland Kitchen... Daily x5 then weekely x4, then monthly  Orders: Vit B12 1000 mcg (J3420) [facility]- Est Level  3 (60454)  Problem # 2:  HYPERLIPIDEMIA (ICD-272.4)  Will restart simvastatin.  Gave instruction, written and verbal, to patient abou thow to take the simvastatin and to call if she has any reactions.  Her updated medication list for this problem includes:  Simvastatin 40 Mg Tabs (Simvastatin) ..... One qhs  Orders: [facility]- Est Level  3 (09811)  Complete Medication List: 1)  Hydrocortisone 2.5 % Oint (Hydrocortisone) .... Apply to affected area twice daily as needed for itching.  disp 1 large container 2)  Simvastatin 40 Mg Tabs (Simvastatin) .... One qhs 3)  Cobal-1000 1000 Mcg/ml Soln (Cyanocobalamin) .... Daily x5 then weekely x4, then monthly  Patient Instructions: 1)  Your cholesterol is too high.  I want you to start a medicine called SIMVASTATIN.  You will take one pill every night before you go to bed.  If you have any trouble from it, you can stop it but be sure to call and let me know. 2)  Please schedule a full physical at the end of July. 3)  It was very nice to meet you today. Prescriptions: SIMVASTATIN 40 MG TABS (SIMVASTATIN) one qhs  #30 x 2   Entered and Authorized by:   Ardeen Garland  MD   Signed by:   Ardeen Garland  MD on 04/28/2008   Method used:   Print then Give to Patient   RxID:   [id number]    Medication Administration  Injection # 1:    Medication: Vit B12 1000 mcg    Diagnosis: ANEMIA, PERNICIOUS (ICD-281.0)    Route: IM    Site: R deltoid  Exp Date: 12/28/2009    Lot #: [id number]    Mfr: [employer] Regent    Patient tolerated injection without complications    Given by: Modesta Messing LPN (April 28, 1608 2:34 PM)  Orders Added: 1)  Vit B12 1000 mcg [J3420] 2)  [facility]- Est Level  3 [96045]

## 2010-03-01 NOTE — Assessment & Plan Note (Signed)
Summary: b12/wp  Nurse Visit    Prior Medications: HYDROCORTISONE 2.5 %  OINT (HYDROCORTISONE) apply to affected area twice daily as needed for itching.  Disp 1 large container SIMVASTATIN 40 MG TABS (SIMVASTATIN) one qhs COBAL-1000 1000 MCG/ML SOLN (CYANOCOBALAMIN) daily X5 then weekely X4, then monthly Current Allergies: * SULFA (SULFONAMIDES) GROUP    Medication Administration  Injection # 1:    Medication: Vit B12 1000 mcg    Diagnosis: ANEMIA, PERNICIOUS (ICD-281.0)    Route: IM    Site: L deltoid    Exp Date: 09/28/2009    Lot #: [id number]    Mfr: American Regent    Patient tolerated injection without complications    Given by: Dedra Skeens CMA, (January 04, 2008 10:46 AM)  Orders Added: 1)  Admin of Therapeutic Inj  intramuscular or subcutaneous [96372] 2)  Vit B12 1000 mcg [email or web address]    ]  Medication Administration  Injection # 1:    Medication: Vit B12 1000 mcg    Diagnosis: ANEMIA, PERNICIOUS (ICD-281.0)    Route: IM    Site: L deltoid    Exp Date: 09/28/2009    Lot #: [id number]    Mfr: [employer] Regent    Patient tolerated injection without complications    Given by: Dedra Skeens CMA, (January 04, 2008 10:46 AM)  Orders Added: 1)  Admin of Therapeutic Inj  intramuscular or subcutaneous [96372] 2)  Vit B12 1000 mcg [J3420]   Complete Medication List: 1)  Hydrocortisone 2.5 % Oint (Hydrocortisone) .... Apply to affected area twice daily as needed for itching.  disp 1 large container 2)  Simvastatin 40 Mg Tabs (Simvastatin) .... One qhs 3)  Cobal-1000 1000 Mcg/ml Soln (Cyanocobalamin) .... Daily x5 then weekely x4, then monthly

## 2010-03-01 NOTE — Assessment & Plan Note (Signed)
Summary: GERI ASSESSMENT/[facility]   Vital Signs:  Patient Profile:   77 Years Old Female Height:     66.75 inches Weight:      134.2 pounds Temp:     98.4 degrees F Pulse rate:   84 / minute BP sitting:   113 / 70  (left arm)  Pt. in pain?   no  Vitals Entered By: Theresia Lo RN (September 17, 2007 8:39 AM)              Is Patient Diabetic? No     History of Present Illness: Here for memory evaluation, referred per D. Tabori.  "I haven't felt good in a good while", mostly bothered by right hand numbness.  Right hand feels numb on and off, sometimes she is not able to do fine tasks, putting on earings today was difficult.  Left hand is fine.  Does not fall, loves to walkHas intermittent vertigo, uses a OTC motion sickness product when it acts up.  Being treated for UTI on Keflex, symptoms are resolving.   Lives with female friend at [facility], one daughter in [place], and son in [place].  Daughter present during the visit.  Knows that she needs to quit smoking has smoked for 55 years.  Had full CPE less than one month ago.  Describes her health as excellent, walks everywhere she can and uses the bus if needed.  Pays own bills, shops and cools, completely self sufficient in all ADLs.     Current Allergies: * SULFA (SULFONAMIDES) GROUP   Family History:    brother died of lung CA at42    Father died of MI    mother died of kidney failure    sister died of stomach CA at 58    sister with breast ca  Social History:    Lives with a friend in [place]     2 children.      Smoked since 72 yo. Walks at least a mile every day.     No etoh.     Active in church.    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   Risk Factors:     Counseled to quit/cut down tobacco use:  yes   Review of Systems  The patient denies anorexia, weight loss, vision loss, decreased hearing, chest pain, syncope, dyspnea on exertion, peripheral edema, headaches, abdominal  pain, melena, and hematochezia.    General      Denies loss of appetite, sleep disorder, and weakness.  Resp      Denies cough, shortness of breath, and wheezing.  GI      Denies bloody stools.  GU      Denies dysuria and incontinence.  MS      Complains of joint pain and stiffness.  Derm      Complains of dryness and itching.      Denies changes in nail beds, hair loss, and rash.  Neuro      Complains of numbness, sensation of room spinning, and tingling.      Denies difficulty with concentration, disturbances in coordination, falling down, headaches, inability to speak, memory loss, poor balance, tremors, visual disturbances, and weakness.  Psych      Denies anxiety, depression, easily angered, and irritability.   Physical Exam  General:     Alert, well groomed, thin black female.  Complete CPE 3 weeks ago. Msk:     Full use of UE with full ROM,  and 5/5 strenth,  no muscle waisting or faciculations,  Neurologic:     alert & oriented X3, strength normal in all extremities, gait normal, finger-to-nose normal, and Romberg negative.      Impression & Recommendations:  Problem # 1:  MEMORY LOSS (ICD-780.93) No signs of dementia, will remove this problem from the active list.  Did have some type of perceptual deficit, found on drawing the pentagons on the MMSE.  Otherwise she scored 28/30 ( remembered 2/3 objects in 5 minutes). Orders: Comp Met-[facility] [id number]) Lipid-[facility] (09811-91478) CBC-[facility] (29562) TSH-[facility] [phone]) [facility]- Est  Level 4 (96295)   Problem # 2:  WRIST PAIN, RIGHT (ICD-719.43) Appears to be sensory in nature.  WIll check TSH and CBC. Suspect peripheral neuropathy. Orders: [facility]- Est  Level 4 (28413)   Problem # 3:  PRURITUS (ICD-698.9) Did not complain of such today. Orders: Comp Met-[facility] [id number]) Lipid-[facility] (36644-03474) CBC-[facility] (25956) TSH-[facility] [phone]) [facility]- Est  Level 4 (51884)   Problem # 4:  DYSURIA (ICD-788.1) Assessment:  Improved Completing course of antibiotics, instructed to talk full course. Her updated medication list for this problem includes:    Keflex 250 Mg Caps (Cephalexin) .Marland Kitchen... 1 tab by mouth three times a day x 7 days   Problem # 5:  TOBACCO DEPENDENCE (ICD-305.1) Encouraged to quit. Orders: [facility]- Est  Level 4 (99214)   Problem # 6:  HYPERCHOLESTEROLEMIA (ICD-272.0) Off statin for unknown reason.  Will check Lipids and restart statin.  I will call her and daughter with results and recommendations.  Complete Medication List: 1)  Keflex 250 Mg Caps (Cephalexin) .Marland Kitchen.. 1 tab by mouth three times a day x 7 days 2)  Hydrocortisone 2.5 % Oint (Hydrocortisone) .... Apply to affected area twice daily as needed for itching.  disp 1 large container   Patient Instructions: 1)  Calcium with VIt D, twice per day 2)  Multivitamin 3)  Tobacco is very bad for your health and your loved ones! You Should stop smoking!. 4)  Stop Smoking Tips: Choose a Quit date. Cut down before the Quit date. decide what you will do as a substitute when you feel the urge to smoke(gum,toothpick,exercise). 5)  Shingles Shot-call insurance supplement, find out if they will pay. 6)  Call for mammogram 7)  Stool cards to be done and returned to [facility] 8)  Apt in October with new MD 9)  Send copy of labs to daughter 42)  Shelva Hetzer 11)  [address] [place])  [place] [id number]   ]  Vital Signs:  Patient Profile:   77 Years Old Female Height:     66.75 inches Weight:      134.2 pounds Temp:     98.4 degrees F Pulse rate:   84 / minute BP sitting:   113 / 70                Last PAP:  Done. (08/29/2003 12:00:00 AM) PAP Next Due:  Not Indicated     Geriatric Assessment:  Activities of Daily Living:    Bathing-independent    Dressing-independent    Eating-independent    Toileting-independent    Transferring-independent    Continence-independent  Instrumental Activities of Daily Living:     Transportation-independent    Meal/Food Preparation-independent    Shopping Errands-independent    Housekeeping/Chores-independent    Money Management/Finances-independent    Medication Management-independent    Ability to Use Telephone-independent    Laundry-independent  Mental Status Exam: (value/max value)    Orientation  to Time: 5/5    Orientation to Place: 5/5    Registration: 3/3    Attention/Calculation: 5/5    Recall: 3/3    Language-name 2 objects: 2/2    Language-repeat: 1/1    Language-follow 3-step command: 3/3    Language-read and follow direction: 1/1    Write a sentence: 1/1    Copy design: 1/1 MSE Total score: 30/30  Geriatric Depression Score: (value/max value)    Short-15: 15/15  Balance: (value/max value)    Sitting balance: 1/1    Arises: 2/2    Attempts to arise: 2/2    Immediate standing balance: 2/2    Standing balance: 1/1    Nudged: 2/2    Eyes closed: 1/1    360 degree turn: 1/1    Sitting down: 2/2 Balance Total Score: 14/14  Gait: (value/max value)    Initiation of gait: 1/1    Step length-left: 1/1    Step height-left: 1/1    Step length-right: 1/1    Step height-right: 1/1    Step symmetry: 1/1    Step continuity: 1/1    Path: 2/2    Trunk: 2/2    Walking stance: 1/1 Gait Total Score: 12/12  Balance + Gait Total Score: 26/26   Appended Document: GERI ASSESSMENT/[facility] Wide MCV detected on CBC, B12 added and found to be low.  Dx Pernicious Anemia (H and H may be close to normal as pt is a long term smoker).  Begin B12 shots monthly.  Appended Document: GERI ASSESSMENT/[facility] sent

## 2010-03-01 NOTE — Assessment & Plan Note (Signed)
Summary: b12wp  Nurse Visit    Prior Medications: KEFLEX 250 MG  CAPS (CEPHALEXIN) 1 tab by mouth three times a day x 7 days HYDROCORTISONE 2.5 %  OINT (HYDROCORTISONE) apply to affected area twice daily as needed for itching.  Disp 1 large container SIMVASTATIN 40 MG TABS (SIMVASTATIN) one qhs Current Allergies: * SULFA (SULFONAMIDES) GROUP    Medication Administration  Injection # 1:    Medication: Vit B12 1000 mcg    Diagnosis: MACROCYTIC ANEMIA (ICD-281.9)    Route: IM    Site: R deltoid    Exp Date: 06/2009    Lot #: [id number]    Mfr: [employer] Regent    Comments: Pt states she cannot come in tomorrow for next b12 injection, but she can come Friday.  Advised pt to come in Friday, and we will complete her daily b12 injections next week, and then start them weekly.    Patient tolerated injection without complications    Given by: AMY MARTIN RN (October 07, 2007 11:40 AM)  Orders Added: 1)  Vit B12 1000 mcg [J3420] 2)  Est Level 1- [facility] [04540]    ]  Medication Administration  Injection # 1:    Medication: Vit B12 1000 mcg    Diagnosis: MACROCYTIC ANEMIA (ICD-281.9)    Route: IM    Site: R deltoid    Exp Date: 06/2009    Lot #: [id number]    Mfr: [employer] Regent    Comments: Pt states she cannot come in tomorrow for next b12 injection, but she can come Friday.  Advised pt to come in Friday, and we will complete her daily b12 injections next week, and then start them weekly.    Patient tolerated injection without complications    Given by: AMY MARTIN RN (October 07, 2007 11:40 AM)  Orders Added: 1)  Vit B12 1000 mcg [J3420] 2)  Est Level 1- [facility] [98119]

## 2010-03-01 NOTE — Assessment & Plan Note (Signed)
Summary: b12 shot/kh  Nurse Visit   Allergies: 1)  * Sulfa (Sulfonamides) Group  Medication Administration  Injection # 1:    Medication: Vit B12 1000 mcg    Diagnosis: ANEMIA, PERNICIOUS (ICD-281.0)    Route: IM    Site: L deltoid    Exp Date: 10/2010    Lot #: [id number]    Mfr: American Regent    Patient tolerated injection without complications    Given by: Theresia Lo RN (March 13, 2009 11:00 AM)  Orders Added: 1)  Vit B12 1000 mcg [J3420] 2)  Admin of Injection (IM/SQ) [16109]   Medication Administration  Injection # 1:    Medication: Vit B12 1000 mcg    Diagnosis: ANEMIA, PERNICIOUS (ICD-281.0)    Route: IM    Site: L deltoid    Exp Date: 10/2010    Lot #: [id number]    Mfr: [employer] Regent    Patient tolerated injection without complications    Given by: Theresia Lo RN (March 13, 2009 11:00 AM)  Orders Added: 1)  Vit B12 1000 mcg [J3420] 2)  Admin of Injection (IM/SQ) [60454]

## 2010-03-01 NOTE — Progress Notes (Signed)
Summary: call pt  Phone Note Call from Patient Call back at Home Phone [phone]   Caller: Patient Summary of Call: pt would like to speak to dr tabori-please call pt Initial call taken by: Dedra Skeens CMA,,  September 21, 2007 12:16 PM  Follow-up for Phone Call        Pt was confused by message indicating she would need to start a 'vitamin shot'.  Explained to her that she needs to start B12 injections and that all she needs is a nurse visit in early september to do this.  Also explained she'd be getting a letter by mail explaining this.  Pt appreciative of the info. Follow-up by: Neena Rhymes  MD,  September 21, 2007 1:04 PM

## 2010-03-01 NOTE — Miscellaneous (Signed)
Summary: Changing Prob List   Clinical Lists Changes  Problems: Removed problem of SEBACEOUS CYST (ICD-706.2) Removed problem of PHYSICAL EXAMINATION (ICD-V70.0) Removed problem of SEBORRHEIC KERATOSIS (ICD-702.19) Observations: Added new observation of PAST SURG HX: lipid panel 2/08: TC=303, TG=256, HDL=68, LDL=184 - 03/17/2006,  MRI-->nl 1/03 - 07/30/2001 (12/06/2009 22:06) Added new observation of PAST MED HX: cataract removal 6/03,  hysterectomy 1971,  started fish oil capusles 4-06 Seborrheic keratosis  (12/06/2009 22:06) Added new observation of PRIMARY MD: Cat Ta MD (12/06/2009 22:06)       Past History:  Past Medical History: cataract removal 6/03,  hysterectomy 1971,  started fish oil capusles 4-06 Seborrheic keratosis  Past Surgical History: lipid panel 2/08: TC=303, TG=256, HDL=68, LDL=184 - 03/17/2006,  MRI-->nl 1/03 - 07/30/2001

## 2010-03-01 NOTE — Assessment & Plan Note (Signed)
Summary: B12 SHOT/KH  Nurse Visit    Allergies: 1)  * Sulfa (Sulfonamides) Group  Medication Administration  Injection # 1:    Medication: Vit B12 1000 mcg    Diagnosis: ANEMIA, PERNICIOUS (ICD-281.0)    Route: IM    Site: R deltoid    Exp Date: 05/2010    Lot #: [id number]    Mfr: American Regent    Patient tolerated injection without complications    Given by: Theresia Lo RN (October 05, 2008 9:39 AM)  Orders Added: 1)  Admin of Injection (IM/SQ) [04540]   Medication Administration  Injection # 1:    Medication: Vit B12 1000 mcg    Diagnosis: ANEMIA, PERNICIOUS (ICD-281.0)    Route: IM    Site: R deltoid    Exp Date: 05/2010    Lot #: [id number]    Mfr: American Regent    Patient tolerated injection without complications    Given by: Theresia Lo RN (October 05, 2008 9:39 AM)  Orders Added: 1)  Admin of Injection (IM/SQ) [98119]

## 2010-03-01 NOTE — Assessment & Plan Note (Signed)
Summary: 2 wk f/u,df   Vital Signs:  Patient profile:   77 year old female Height:      67.0 inches Weight:      141 pounds BMI:     22.16 Temp:     97.8 degrees F oral Pulse rate:   85 / minute BP sitting:   108 / 67  (left arm) Cuff size:   regular  Vitals Entered By: Tessie Fass CMA (April 05, 2009 1:49 PM) CC: F/U left shoulder and elbow pain Is Patient Diabetic? No Pain Assessment Patient in pain? no        Primary Care Provider:  Ardeen Garland  MD  CC:  F/U left shoulder and elbow pain.  History of Present Illness: 77 y/o F here for f/u of Left Shoulder pain: better since last visit.  taking naproxen 250mg  two times a day. Taking med without discomfort.  Shoulder pain does not limit her activity.   Habits & Providers  Alcohol-Tobacco-Diet     Tobacco Status: current  Current Medications (verified): 1)  Hydrocortisone 2.5 %  Oint (Hydrocortisone) .... Apply To Affected Area Twice Daily As Needed For Itching.  Disp 1 Large Container 2)  Simvastatin 40 Mg Tabs (Simvastatin) .... One Qhs 3)  Cobal-1000 1000 Mcg/ml Soln (Cyanocobalamin) .... Daily X5 Then Weekely X4, Then Monthly 4)  Naproxen 250 Mg Tabs (Naproxen) .Marland Kitchen.. 1 Tab By Mouth Two Times A Day For Pain  Allergies (verified): 1)  * Sulfa (Sulfonamides) Group  Social History: Lives with a friend in [place]  2 children.   Smoked since 71 yo. Smokes <1 pack per week.   Walks at least a mile every day.  No etoh.  Active in church. 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  Review of Systems MS:  Complains of joint pain; denies loss of strength, low back pain, and stiffness.  Physical Exam  General:  Well-developed,well-nourished,in no acute distress; alert,appropriate and cooperative throughout examination. vitals reviewed.    Shoulder/Elbow Exam  Skin:    Intact, no scars, lesions, rashes, cafe au lait spots or bruising.    Inspection:    Inspection is normal.      Palpation:    Non-tender to palpation bilaterally.    Vascular:    Radial, ulnar, brachial, and axillary pulses 2+ and symmetric; capillary refill less than 2 seconds; no evidence of ischemia, clubbing, or cyanosis.    Shoulder Exam:    Left:    Inspection:  Normal    Palpation:  Normal    Stability:  stable    Tenderness:  no    Swelling:  no    Erythema:  no    Shoulder: Inspection reveals no abnormalities, atrophy or asymmetry. Palpation is normal with no tenderness over AC joint or bicipital groove. ROM is full in all planes. Rotator cuff strength normal throughout. No signs of impingement with negative Neer and Hawkin's tests, empty can. Speeds and Yergason's tests normal. No labral pathology noted with negative Obrien's, negative clunk and good stability. Normal scapular function observed. No painful arc and no drop arm sign. No apprehension sign      Impression & Recommendations:  Problem # 1:  PAIN IN JOINT, SHOULDER REGION (ICD-719.41) Assessment Improved  Shoulder pain resolved.  Cont Naproxen as prescribed.  Her updated medication list for this problem includes:    Naproxen 250 Mg Tabs (Naproxen) .Marland Kitchen... 1 tab by mouth two times a day for pain  Orders:  [facility]- Est Level  3 (16109)  Problem # 2:  TOBACCO DEPENDENCE (ICD-305.1) NOt interested in quitting.  We discussed smoking cessation.   Complete Medication List: 1)  Hydrocortisone 2.5 % Oint (Hydrocortisone) .... Apply to affected area twice daily as needed for itching.  disp 1 large container 2)  Simvastatin 40 Mg Tabs (Simvastatin) .... One qhs 3)  Cobal-1000 1000 Mcg/ml Soln (Cyanocobalamin) .... Daily x5 then weekely x4, then monthly 4)  Naproxen 250 Mg Tabs (Naproxen) .Marland Kitchen.. 1 tab by mouth two times a day for pain  Patient Instructions: 1)  Please schedule a follow-up appointment as needed with Dr Georgiana Shore or Dr Janalyn Harder.  2)  Continue taking Naproxen two times a day for shoulder pain. 3)  Tobacco is very  bad for your health and your loved ones ! You should stop smoking !  4)  Stop smoking tips: Choose a quit date. Cut down before the quit date. Decide what you will do as a substitute when you feel the urge to smoke(gum, toothpick, exercise).    Prevention & Chronic Care Immunizations   Influenza vaccine: Not documented   Influenza vaccine deferral: Deferred  (04/05/2009)    Tetanus booster: 11/29/2002: Done.   Tetanus booster due: 11/28/2012    Pneumococcal vaccine: Done.  (11/29/2002)   Pneumococcal vaccine due: None    H. zoster vaccine: Not documented  Colorectal Screening   Hemoccult: Done.  (01/29/2004)   Hemoccult due: 01/28/2005    Colonoscopy: Not documented  Other Screening   Pap smear: Done.  (08/29/2003)   Pap smear due: Not Indicated    Mammogram: ASSESSMENT: Negative - BI-RADS 1^MM DIGITAL SCREENING  (12/06/2008)   Mammogram due: 02/28/2005    DXA bone density scan: Done.  (02/29/2004)   DXA scan due: None    Smoking status: current  (04/05/2009)   Smoking cessation counseling: YES  (04/05/2009)  Lipids   Total Cholesterol: 215  (12/14/2008)   LDL: 121  (12/14/2008)   LDL Direct: Not documented   HDL: 60  (12/14/2008)   Triglycerides: 171  (12/14/2008)    SGOT (AST): 17  (12/14/2008)   SGPT (ALT): 10  (12/14/2008)   Alkaline phosphatase: 52  (12/14/2008)   Total bilirubin: 0.5  (12/14/2008)    Lipid flowsheet reviewed?: Yes   Progress toward LDL goal: At goal  Self-Management Support :   Personal Goals (by the next clinic visit) :      Personal LDL goal: 130  (04/05/2009)    Lipid self-management support: Written self-care plan  (04/05/2009)   Lipid self-care plan printed.

## 2010-03-01 NOTE — Assessment & Plan Note (Signed)
Summary: shoulder pain/eo   Vital Signs:  Patient profile:   77 year old female Height:      67.0 inches Weight:      140.5 pounds BMI:     22.08 Temp:     98.6 degrees F oral Pulse rate:   79 / minute BP sitting:   114 / 72  (left arm) Cuff size:   regular  Vitals Entered By: Garen Grams LPN (October 24, 2009 2:54 PM) CC: pain in both arms x 1 month Is Patient Diabetic? No Pain Assessment Patient in pain? yes     Location: upper arms   Primary Care Tlaloc Taddei:  Cat Ta MD  CC:  pain in both arms x 1 month.  History of Present Illness: 77 y/o F here for b/l shouler pain x 1 month.  No injuries.  She can recall carrying a heavy table to the trash bin and shoulder hurting soon after that.  She has not tried any med for the pain.  Sometimes pain keeps her up at night.  She can still do normal daily cleaning routine, llike moping and washing laundary.  She has not done heavy lifting in long time.  Pain is achy and intermittent.    Habits & Providers  Alcohol-Tobacco-Diet     Tobacco Status: current     Tobacco Counseling: to quit use of tobacco products     Cigarette Packs/Day: 1 pack per 2 weeks  Current Medications (verified): 1)  Hydrocortisone 2.5 %  Oint (Hydrocortisone) .... Apply To Affected Area Twice Daily As Needed For Itching.  Disp 1 Large Container 2)  Simvastatin 40 Mg Tabs (Simvastatin) .... One Qhs 3)  Cobal-1000 1000 Mcg/ml Soln (Cyanocobalamin) .... Daily X5 Then Weekely X4, Then Monthly  Allergies (verified): 1)  * Sulfa (Sulfonamides) Group  Past History:  Past Medical History: Last updated: 03/27/2006 cataract removal 6/03, hysterectomy 1971, started fish oil capusles 4-06  Past Surgical History: Last updated: 03/27/2006 lipid panel 2/08: TC=303, TG=256, HDL=68, LDL=184 - 03/17/2006, MRI-->nl 1/03 - 07/30/2001  Family History: Last updated: October 13, 2007 brother died of lung CA 07/05/78 Father died of MI mother died of kidney failure sister died of  stomach CA at 32 sister with breast ca  Social History: Last updated: 04/05/2009 Lives with a friend in [place]  2 children.   Smoked since 80 yo. Smokes <1 pack per week.   Walks at least a mile every day.  No etoh.  Active in church. 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  Risk Factors: Smoking Status: current (10/24/2009) Packs/Day: 1 pack per 2 weeks (10/24/2009)  Review of Systems General:  Denies chills, fever, and weight loss. MS:  Complains of joint pain and loss of strength; denies joint redness, joint swelling, muscle weakness, and stiffness.  Physical Exam  General:  Well-developed,well-nourished,in no acute distress; alert,appropriate and cooperative throughout examination. vitals reviewed.  Msk:  Shoulder: Inspection reveals no abnormalities, atrophy or asymmetry. Palpation is normal with no tenderness over AC joint or bicipital groove. ROM is full in all planes. Rotator cuff strength normal throughout. No signs of impingement with negative Neer and Hawkin's tests, empty can. Speeds and Yergason's tests normal. No labral pathology noted with negative Obrien's, negative clunk and good stability. Normal scapular function observed. No painful arc and no drop arm sign. No apprehension sign      Impression & Recommendations:  Problem # 1:  PAIN IN JOINT, SHOULDER REGION (ICD-719.41) Assessment New  Shoulder  pain may be muscle strain from lifting and carrying heavy table.  Exam benign except for mild weakness, which is normal with age.  Pt does not want to take tylenol, so will take ibuprofen 400mg  three times a day.  She is to call me in 2 wks to let me know how she is doing.  The following medications were removed from the medication list:    Naproxen 250 Mg Tabs (Naproxen) .Marland Kitchen... 1 tab by mouth two times a day for pain  Orders: [facility]- Est Level  3 (45409)  Complete Medication List: 1)  Hydrocortisone 2.5 % Oint  (Hydrocortisone) .... Apply to affected area twice daily as needed for itching.  disp 1 large container 2)  Simvastatin 40 Mg Tabs (Simvastatin) .... One qhs 3)  Cobal-1000 1000 Mcg/ml Soln (Cyanocobalamin) .... Daily x5 then weekely x4, then monthly  Patient Instructions: 1)  Please call me in 2 weeks if shoulder not better.  2)  For pain: take Advil 2 tabs three times a day as needed.   3)  If everything is ok, I will see you in November.    Prevention & Chronic Care Immunizations   Influenza vaccine: Not documented   Influenza vaccine deferral: Deferred  (04/05/2009)    Tetanus booster: 11/29/2002: Done.   Tetanus booster due: 11/28/2012    Pneumococcal vaccine: Done.  (11/29/2002)   Pneumococcal vaccine due: None    H. zoster vaccine: Not documented  Colorectal Screening   Hemoccult: Done.  (01/29/2004)   Hemoccult action/deferral: Refused  (10/24/2009)   Hemoccult due: 01/28/2005    Colonoscopy: Not documented   Colonoscopy action/deferral: Refused  (10/24/2009)  Other Screening   Pap smear: Done.  (08/29/2003)   Pap smear due: Not Indicated    Mammogram: ASSESSMENT: Negative - BI-RADS 1^MM DIGITAL SCREENING  (12/06/2008)   Mammogram due: 02/28/2005    DXA bone density scan: Done.  (02/29/2004)   DXA scan due: None    Smoking status: current  (10/24/2009)   Smoking cessation counseling: YES  (04/05/2009)  Lipids   Total Cholesterol: 215  (12/14/2008)   LDL: 121  (12/14/2008)   LDL Direct: Not documented   HDL: 60  (12/14/2008)   Triglycerides: 171  (12/14/2008)    SGOT (AST): 17  (12/14/2008)   SGPT (ALT): 10  (12/14/2008)   Alkaline phosphatase: 52  (12/14/2008)   Total bilirubin: 0.5  (12/14/2008)  Self-Management Support :   Personal Goals (by the next clinic visit) :      Personal LDL goal: 130  (04/05/2009)    Lipid self-management support: Written self-care plan  (04/05/2009)

## 2010-03-01 NOTE — Progress Notes (Signed)
Summary: GI note  ---- Converted from flag ---- ---- 04/15/2006 9:40 AM, Renato Battles SLADE CMA, wrote:   ---- 04/15/2006 9:33 AM, Levada Schilling wrote: 04/15/06 @ 9:25  MELISSA @ DR. SCHOOLER [phone]  PT WAS REFERRED FOR SCREENING COLONOSCOPY.  PT WAS SEEN ON 3/13 AND WAS SCHEDULED FOR COLONOSCOPY THIS THURSDAY.  PT HAS CANCELLED.  SAYS SHE IS TOO SCARED AND NERVOUS.  WILL CALL BACK WHEN SHE HAS THE NERVE TO DO PROCEDURE.  CALL IF ANY QUESTIONS. ------------------------------

## 2010-03-01 NOTE — Assessment & Plan Note (Signed)
Summary: carpal tunnell per Dr. Jefferey Pica   Vital Signs:  Patient Profile:   77 Years Old Female Height:     66.75 inches Weight:      133 pounds Pulse rate:   76 / minute BP sitting:   111 / 74  Vitals Entered By: Lillia Pauls CMA (October 12, 2007 2:03 PM)                 Chief Complaint:  B HAND PAIN X 3 WKS.  History of Present Illness: 3 weeks of B hand numbness--first noticed when she wa hanging up clothes. No better--all parts of hand--functional but numb. Somewhat painful prickling.  Recently dx carpal tunnel syndrome and also recently dx B 12 deficiency. Has not gotten all of her B 12 shots.  No foot numbness--no gait poblems, felt otherwise ok.     Current Allergies: * SULFA (SULFONAMIDES) GROUP      Physical Exam  General:     well-developed and well-nourished.  well-developed and well-nourished.   Neck:     FROM, neg spurlings test. No neck pain w AROM, no tenderness to palpation. No thyromegally Additional Exam:     B hands have loss of 2 point discrimantion from wrist distally. Normal grip strength, normal iinterosseus mstrength, no muscle wasting. Normal vascular exam. neg tinel and phalen B.    Impression & Recommendations:  Problem # 1:  NEUROPATHY, PERIPHERAL (ICD-356.9) suspect this is from B 12 deficiency--recent lab work supports that. there may be a component of carpal tunnel as well. we discussed and agreed to: give another B 12 shot 2) neurology referral 3) right carpal tunnel inj today Orders: [facility]- Est Level  3 ([id number])   Complete Medication List: 1)  Keflex 250 Mg Caps (Cephalexin) .Marland Kitchen.. 1 tab by mouth three times a day x 7 days 2)  Hydrocortisone 2.5 % Oint (Hydrocortisone) .... Apply to affected area twice daily as needed for itching.  disp 1 large container 3)  Simvastatin 40 Mg Tabs (Simvastatin) .... One qhs 4)  Cobal-1000 1000 Mcg/ml Soln (Cyanocobalamin) .... Daily x5 then weekely x4, then monthly  Other Orders: Injection,  intermediate joint - [facility] (20605) Vit B12 1000 mcg (I6962)   Patient Instructions: 1)  We think most of your hand numbness is from a vitamin B 12 deficiency. We completed a third shot of B 12 today. You should now come MONTHLY for a B 12 injection. That is a nurse visit and does not need an appointment. 2)  PLEASE COME ONCE A MONTH FOR A B12 shot! 3)  We will also schedule you with a neurologist for an opinion regarding this neuropathy (hand numbness)--let's make sure nothing else is going on too. Today we injected your right wrist in  the caropal tunnel--if the right wrist gets a lot better than the left please let us know. 4)  Please see Ms Humberto Seals in 3 weeks   ]  Medication Administration  Injection # 1:    Medication: Vit B12 1000 mcg    Diagnosis: ANEMIA, PERNICIOUS (ICD-281.0)    Route: IM    Site: R deltoid    Exp Date: 06/28/2009    Lot #: [id number]    Mfr: [employer] Regent    Patient tolerated injection without complications    Given by: Lillia Pauls CMA (October 12, 2007 3:08 PM)  Orders Added: 1)  [facility]- Est Level  3 [95284] 2)  Injection, intermediate joint - [facility] [20605] 3)  Vit B12 1000 mcg [J3420]

## 2010-03-01 NOTE — Assessment & Plan Note (Signed)
Summary: b12 shot/kh  Nurse Visit   Allergies: 1)  * Sulfa (Sulfonamides) Group  Medication Administration  Injection # 1:    Medication: Vit B12 1000 mcg    Diagnosis: ANEMIA, PERNICIOUS (ICD-281.0)    Route: IM    Site: L deltoid    Exp Date: 05/2010    Lot #: [id number]    Mfr: [employer]ent    Patient tolerated injection without complications    Given by: Theresia Lo RN (November 01, 2008 9:29 AM)  Orders Added: 1)  Vit B12 1000 mcg [J3420] 2)  Admin of Injection (IM/SQ) [53664]   Medication Administration  Injection # 1:    Medication: Vit B12 1000 mcg    Diagnosis: ANEMIA, PERNICIOUS (ICD-281.0)    Route: IM    Site: L deltoid    Exp Date: 05/2010    Lot #: [id number]    Mfr: [employer]ent    Patient tolerated injection without complications    Given by: Theresia Lo RN (November 01, 2008 9:29 AM)  Orders Added: 1)  Vit B12 1000 mcg [J3420] 2)  Admin of Injection (IM/SQ) [74259]

## 2010-03-01 NOTE — Assessment & Plan Note (Signed)
Summary: b12wp  Nurse Visit    Prior Medications: KEFLEX 250 MG  CAPS (CEPHALEXIN) 1 tab by mouth three times a day x 7 days HYDROCORTISONE 2.5 %  OINT (HYDROCORTISONE) apply to affected area twice daily as needed for itching.  Disp 1 large container SIMVASTATIN 40 MG TABS (SIMVASTATIN) one qhs Current Allergies: * SULFA (SULFONAMIDES) GROUP    Medication Administration  Injection # 1:    Medication: Vit B12 1000 mcg    Diagnosis: MACROCYTIC ANEMIA (ICD-281.9)    Route: IM    Site: L deltoid    Exp Date: 06/28/2009    Lot #: [id number]    Mfr: [employer]ent    Patient tolerated injection without complications    Given by: Lillia Pauls CMA (October 09, 2007 11:05 AM)  Orders Added: 1)  Vit B12 1000 mcg [J3420] 2)  Est Level 1- [facility] [16109]    ]   Medication Administration  Injection # 1:    Medication: Vit B12 1000 mcg    Diagnosis: MACROCYTIC ANEMIA (ICD-281.9)    Route: IM    Site: L deltoid    Exp Date: 06/28/2009    Lot #: [id number]    Mfr: [employer] Regent    Patient tolerated injection without complications    Given by: Lillia Pauls CMA (October 09, 2007 11:05 AM)  Orders Added: 1)  Vit B12 1000 mcg [J3420] 2)  Est Level 1- [facility] [60454]   Patient Instructions: 1)  RTC IN 3 DAYS FOR NEXT INJECTION   Complete Medication List: 1)  Keflex 250 Mg Caps (Cephalexin) .Marland Kitchen.. 1 tab by mouth three times a day x 7 days 2)  Hydrocortisone 2.5 % Oint (Hydrocortisone) .... Apply to affected area twice daily as needed for itching.  disp 1 large container 3)  Simvastatin 40 Mg Tabs (Simvastatin) .... One qhs

## 2010-03-01 NOTE — Consult Note (Signed)
Summary: [facility]   Imported By: Denny Peon LEVAN 04/15/2006 12:09:51  _____________________________________________________________________  External Attachment:    Type:   Image     Comment:   External Document

## 2010-03-01 NOTE — Miscellaneous (Signed)
Summary: Orders Update  Clinical Lists Changes  Orders: Added new Test order of Miscellaneous Lab Charge-[facility] [phone]) - Signed Added new Test order of B12-[facility] [phone]) - Signed Added new Test order of B12-[facility] ([id number]) - Signed Added new Test order of Miscellaneous Lab Charge-[facility] [phone]) - Signed  Will confirm diagnosis of B12 def, by repeating and getting methylmalonic acid level that should be elevated in B12 def. prior to committing long term B12 therapy.

## 2010-03-01 NOTE — Assessment & Plan Note (Signed)
Summary: physical wp   Vital Signs:  Patient Profile:   77 Years Old Female Weight:      135.1 pounds Temp:     97.1 degrees F Pulse rate:   57 / minute BP sitting:   105 / 71  (left arm)  Pt. in pain?   no  Vitals Entered By: Alphia Kava (August 25, 2007 9:29 AM)              Is Patient Diabetic? No     Chief Complaint:  cpe.  History of Present Illness: 77 yo woman who RTC today for CPE.  Pt w/out complaint other than her persistant neuropathic pain on her L flank.  States this improves w/ the Lidoderm patches.  Occasionally has bouts of vertigo but takes an OTC medication that improves her sxs.  Previously on Meclizine- likes the OTC med better b/c 'it's less expensive and works just as well'.  Pt states she 'eats all of [place]in[place] Otto', has 'an enormous appetite'.  Still walks all over town (multiple miles/day)- refuses to entertain the idea of a cane to support herself when her vertigo kicks in.  As for health maintainence- will schedule a mammogram but refuses colonoscopy.  Went to GI consultation but once they explained the procedure to her, she refused.  Had hysterectomy in early 1970s- doesn't need pap.    Current Allergies: * SULFA (SULFONAMIDES) GROUP   Social History:    Reviewed history from 03/27/2006 and no changes required:       Lives with a friend in [place], 2 children.  Smoked since 78 yo. Walks at least a mile every day. No etoh. Active in church.   Risk Factors:     Counseled to quit/cut down tobacco use:  yes   Review of Systems      See HPI   Physical Exam  General:     Well-developed,well-nourished,in no acute distress; alert,appropriate and cooperative throughout examination Head:     Normocephalic and atraumatic without obvious abnormalities. No apparent alopecia or balding. Eyes:     No corneal or conjunctival inflammation noted. EOMI. Perrla.  Ears:     External ear exam shows no significant lesions or deformities.  Otoscopic examination  reveals clear canals, tympanic membranes are intact bilaterally without bulging, retraction, inflammation or discharge. Hearing is grossly normal bilaterally. Nose:     External nasal examination shows no deformity or inflammation. Nasal mucosa are pink and moist without lesions or exudates. Mouth:     Oral mucosa and oropharynx without lesions or exudates.  Teeth in good repair. Neck:     No deformities, masses, or tenderness noted. Breasts:     No mass, nodules, thickening, tenderness, bulging, retraction, inflamation, nipple discharge or skin changes noted.   Lungs:     Normal respiratory effort, chest expands symmetrically. Lungs slightly decreased breath sounds, no crackles or wheezes. Heart:     Normal rate and regular rhythm. S1 and S2 normal without gallop, murmur, click, rub or other extra sounds. Abdomen:     Bowel sounds positive,abdomen soft and non-tender without masses, organomegaly or hernias noted.  No CVA or suprapubic tenderness Msk:     No deformity or scoliosis noted of thoracic or lumbar spine.   Pulses:     +2 DP and radial Extremities:     No C/C/E Neurologic:     No cranial nerve deficits noted. Station and gait are normal. Plantar reflexes are down-going bilaterally. DTRs are symmetrical throughout. Sensory, motor and  coordinative functions appear intact. Skin:     Intact without suspicious lesions or rashes Cervical Nodes:     No lymphadenopathy noted Axillary Nodes:     No palpable lymphadenopathy Psych:     normally interactive and good eye contact.  mild memory deficits    Impression & Recommendations:  Problem # 1:  HYPERLIPIDEMIA (ICD-272.4) Assessment: Unchanged Pt w/ hx of hyperlipidemia, needs FLP.  Pt has been resistant to meds in past.  Will check labs and discuss. Future Orders: Comp Met-[facility] [phone]) ... 08/24/2008 Lipid-[facility] ([id number]) ... 08/24/2008   Problem # 2:  WELL ADULT EXAM (ICD-V70.0) Assessment: Unchanged Pt's PE  WNL.  Question early dementia but pt not bothered by mild memory decifits and states 'I get on just fine'.  Applauded pt's continued efforts to stay active.  Pt to schedule mammo, refuses colon cancer screening. Orders: Mammogram (Mammogram) [facility] - Est  65+ [phone])   Problem # 3:  VERTIGO NOS OR DIZZINESS (ICD-780.4) Assessment: Improved Pt to take OTC medication that treats her sxs.  Complete Medication List: 1)  Lidoderm 5 % Ptch (Lidocaine) .... Apply 1 patch to skin every four hours 2)  Ultram 50 Mg Tabs (Tramadol hcl) .Marland Kitchen.. 1 tab q6 as needed for pain 3)  Lidoderm 5 % Ptch (Lidocaine) .... Apply 1 patch for up to 12 hours daily, cut to appropriate size- dispense 1 box 4)  Doxycycline Hyclate 100 Mg Tabs (Doxycycline hyclate) .Marland Kitchen.. 1 by mouth two times a day for 10 days 5)  Prednisone 20 Mg Tabs (Prednisone) .... 2 by mouth daily for 10 days 6)  Keflex 250 Mg Caps (Cephalexin) .Marland Kitchen.. 1 tab by mouth three times a day x 7 days   Patient Instructions: 1)  Follow up in 3 months or as needed 2)  Use the Lidoderm patch on your left side as needed for pain 3)  Take the over the counter medication for your dizziness as needed 4)  Schedule your mammogram- this is important! 5)  Come in any morning (we open at 8:30) before you eat breakfast to have your labs drawn 6)  Take care of yourself!  I'll miss you!   ]

## 2010-03-01 NOTE — Assessment & Plan Note (Signed)
Summary: Knee pain, B12 SHOT/KH  Nurse Visit   Vital Signs:  Patient profile:   77 year old female Height:      67.0 inches Weight:      137 pounds BMI:     21.53 Temp:     97.8 degrees F Pulse rate:   73 / minute BP sitting:   116 / 66  (left arm)  Vitals Entered By: Theresia Lo RN (February 02, 2009 8:58 AM)  Primary Care Provider:  Ardeen Garland  MD  CC:  left knee injury and fell before New Years .  History of Present Illness: Pt fell on 01-23-09 and went to the ED. There she complained of some knee pain even though she had fallen back wards. She had been having knee pain for years. She says that the fall did not make it worse. The X-ray report does not show any acute changes. It did show OA which she already knew she had. Pt occasionally takes Advil liquid gels for her pain but says she has only taken it once since she fell. She is using an ACE bandage on her knee. She was told to f/u with her PCP's office after her fall so she is here today to let us know what is going on.    Physical Exam  General:  Well-developed,well-nourished,in no acute distress; alert,appropriate and cooperative throughout examination Msk:  knees and ankles stable. Minimal swelling in left ankle.  Neurologic:  normal gait, hyporeflexes in knees bilaterally.   CC: left knee injury, fell before New Years  Is Patient Diabetic? No Pain Assessment Patient in pain? yes     Location: left knee Intensity: 8 Type: sharp   Habits & Providers  Alcohol-Tobacco-Diet     Tobacco Status: current     Cigarette Packs/Day: 1 pack per 2 weeks   Impression & Recommendations:  Problem # 1:  KNEE PAIN, LEFT, CHRONIC (ICD-719.46) Assessment Unchanged Pt comes in after ED visiton 01-24-09 for fall. She did not injure her knee in the fall but has chronic knee pain so her knee was x-rayed in the ED. It showed OA. She continues to be mobile. She will use Advil when necessary.   Orders: [facility]- Est Level  3  (16109)  Complete Medication List: 1)  Hydrocortisone 2.5 % Oint (Hydrocortisone) .... Apply to affected area twice daily as needed for itching.  disp 1 large container 2)  Simvastatin 40 Mg Tabs (Simvastatin) .... One qhs 3)  Cobal-1000 1000 Mcg/ml Soln (Cyanocobalamin) .... Daily x5 then weekely x4, then monthly 4)  Doxycycline Hyclate 100 Mg Caps (Doxycycline hyclate) .Marland Kitchen.. 1 tab by mouth two times a day for 14  day.  Other Orders: Vit B12 1000 mcg (J3420)   Current Medications (verified): 1)  Hydrocortisone 2.5 %  Oint (Hydrocortisone) .... Apply To Affected Area Twice Daily As Needed For Itching.  Disp 1 Large Container 2)  Simvastatin 40 Mg Tabs (Simvastatin) .... One Qhs 3)  Cobal-1000 1000 Mcg/ml Soln (Cyanocobalamin) .... Daily X5 Then Weekely X4, Then Monthly 4)  Doxycycline Hyclate 100 Mg Caps (Doxycycline Hyclate) .Marland Kitchen.. 1 Tab By Mouth Two Times A Day For 14  Day.  Allergies: 1)  * Sulfa (Sulfonamides) Group  Medication Administration  Injection # 1:    Medication: Vit B12 1000 mcg    Diagnosis: ANEMIA, PERNICIOUS (ICD-281.0)    Route: IM    Site: R deltoid    Exp Date: 09/2010    Lot #: [id number]  Mfr: [employer]    Patient tolerated injection without complications    Given by: Theresia Lo RN (February 02, 2009 9:02 AM)  Orders Added: 1)  Vit B12 1000 mcg [J3420] 2)  [facility]- Est Level  3 [16109]  Review of Systems        vitals reviewed and pertinent negatives and positives seen in HPI     Medication Administration  Injection # 1:    Medication: Vit B12 1000 mcg    Diagnosis: ANEMIA, PERNICIOUS (ICD-281.0)    Route: IM    Site: R deltoid    Exp Date: 09/2010    Lot #: [id number]    Mfr: [employer] Regent    Patient tolerated injection without complications    Given by: Theresia Lo RN (February 02, 2009 9:02 AM)  Orders Added: 1)  Vit B12 1000 mcg [J3420] 2)  [facility]- Est Level  3 [40981]

## 2010-03-01 NOTE — Assessment & Plan Note (Signed)
Summary: f/u,df   Vital Signs:  Patient profile:   77 year old female Height:      68 inches Weight:      134.25 pounds BMI:     20.49 Temp:     97.7 degrees F oral Pulse rate:   73 / minute Pulse rhythm:   regular BP sitting:   106 / 64  (right arm)  Vitals Entered By: Joann Messing LPN (October 26, 2008 8:44 AM) CC: Pain in left shoulder Is Patient Diabetic? No Pain Assessment Patient in pain? yes     Location: Left shoulder Intensity: 8 Type: aching Onset of pain  one month   Primary Care Provider:  Ardeen Garland  MD  CC:  Pain in left shoulder.  History of Present Illness: Joann [name] comes in today for left shoulder pain x 48month.   1) Shoulder pain: started about a month ago.  Remembers doing some chores around the house, lifting things on to shelves, lots of laundry and shoulder started to hurt then.  Has continued and increased some.  Hurts mostly with raising her arm over about 75-90 degrees.  No radiation.  Pain feels sharp and sometimes sore, "inside" and "through" the shoulder.  No weakness in her hand.  No numbness.  Takes some occassional advil which helps some but not a lot.   2) Of note - tried to restart simvastatin at last visit about 6 months ago but she never went to the pharmacy to get it.    Habits & Providers  Alcohol-Tobacco-Diet     Tobacco Status: current     Cigarette Packs/Day: 0.5  Allergies: 1)  * Sulfa (Sulfonamides) Group  Social History: Packs/Day:  0.5  Physical Exam  General:  thin, alert, well-developed elderly female in NAD vitals reviewe. Msk:  Full strength with IR and ER of left shoulder. Full ROM of left shoulder but with pain when raising her arm above level of the shoulder. Positive empty can test. Slight tenderness to palpation in subacromial area. Pulses:  2+ radial pulses Extremities:  sensation and blood flow intact in left hand Additional Exam:  Procedure note:  Left shoudler injection informed consent  obtained. Skin sterilized with betadyne and alcohol.   Spray anesthetic used. 3cc 2%lidocaine with epi with 1 cc kenalog injected into left shoulder from posterior approach. No complication. Patient tolerated procedure well.    Impression & Recommendations:  Problem # 1:  SHOULDER IMPINGEMENT SYNDROME, LEFT (ICD-726.2) Assessment New  History and exam most consistent with shoulder impingment.  Causing her discomfort but not severely limiting daily activities or range of motion.  Gave shoulder rehab exercises to prevent frozen shoulder.  INjected lidocaine/kenalog today.  Follow-up if not feeling improved next week.   Orders: [facility]- Est Level  3 (30865)  Problem # 2:  HYPERLIPIDEMIA (ICD-272.4) Assessment: Comment Only Gave her another Rx for simvastatin and advised starting today.  Her updated medication list for this problem includes:    Simvastatin 40 Mg Tabs (Simvastatin) ..... One qhs  Complete Medication List: 1)  Hydrocortisone 2.5 % Oint (Hydrocortisone) .... Apply to affected area twice daily as needed for itching.  disp 1 large container 2)  Simvastatin 40 Mg Tabs (Simvastatin) .... One qhs 3)  Cobal-1000 1000 Mcg/ml Soln (Cyanocobalamin) .... Daily x5 then weekely x4, then monthly  Patient Instructions: 1)  I put a steroid and pain medicine injection into your left shoulder today to help you pain.  Sometimes it can hurt a little more  for the first 48 hour, but by 48-72 hours your pain should improve significantly.  If it bothers you tonight, put some ice on it. 2)  To ensure you  maintain good range of motion in that arm, please do the exercises I have given you 2-3 times a day.  It's a good idea to ice your arm for 15 minutes after the exercises. 3)  You can also take some tylenol or motrin if it bothers you. 4)  If your pain is no better by next week, please come back in.  5)  Please begin taking your new cholesterol medication!!  It is important.  Come back in 6 months so  we can recheck your cholesterol.  Prescriptions: SIMVASTATIN 40 MG TABS (SIMVASTATIN) one qhs  #30 x 6   Entered and Authorized by:   Ardeen Garland  MD   Signed by:   Ardeen Garland  MD on 10/26/2008   Method used:   Print then Give to Patient   RxID:   [id number] SIMVASTATIN 40 MG TABS (SIMVASTATIN) one qhs  #30 x 6   Entered and Authorized by:   Ardeen Garland  MD   Signed by:   Ardeen Garland  MD on 10/26/2008   Method used:   Print then Give to Patient   RxID:   [id number]

## 2010-03-01 NOTE — Letter (Signed)
Summary: Generic Letter  [facility]  [address]   [place], [place] [address]   Phone: [phone]  Fax: [phone]    09/21/2007  Joann [name] [address] [place], [place]  [address]  Dear [name],   Results of the tests that we did on 8/20, indicated that your vitamin B12 levels are low.  This could explain some of your symptoms of numbness in your hands, and some forgetfullness.  We will need to begin B12 shots, they will be done monthly.    Please set up an appointment for a nurse visit in September and they will give you your first shot.  In October retun to visit [facility] for follow up and your second B12 shot.  We will continue the monthly shots for a while.        Sincerely,   Luretha Murphy NP [facility]

## 2010-03-01 NOTE — Miscellaneous (Signed)
  Clinical Lists Changes  Problems: Added new problem of ANEMIA, PERNICIOUS (ICD-281.0)

## 2010-03-01 NOTE — Assessment & Plan Note (Signed)
Summary: B12/EO  Nurse Visit   Allergies: 1)  * Sulfa (Sulfonamides) Group  Medication Administration  Injection # 1:    Medication: Vit B12 1000 mcg    Diagnosis: ANEMIA, PERNICIOUS (ICD-281.0)    Route: IM    Site: L deltoid    Exp Date: 08/2010    Lot #: [id number]    Mfr: [employer] Regent    Patient tolerated injection without complications    Given by: Theresia Lo RN (January 02, 2009 8:44 AM)  Orders Added: 1)  Vit B12 1000 mcg [J3420] 2)  Admin of Injection (IM/SQ) [40347]   Medication Administration  Injection # 1:    Medication: Vit B12 1000 mcg    Diagnosis: ANEMIA, PERNICIOUS (ICD-281.0)    Route: IM    Site: L deltoid    Exp Date: 08/2010    Lot #: [id number]    Mfr: [employer]ent    Patient tolerated injection without complications    Given by: Theresia Lo RN (January 02, 2009 8:44 AM)  Orders Added: 1)  Vit B12 1000 mcg [J3420] 2)  Admin of Injection (IM/SQ) [56387]

## 2010-03-01 NOTE — Progress Notes (Signed)
Summary: WI request   Phone Note Call from Patient Call back at Home Phone [phone]   Reason for Call: Talk to Nurse Summary of Call: Pt states experiencing weakness and would like to speak with an rn about this.  She wants to know if she needs to be seen or what she can do to have more energy. Initial call taken by: Haydee Salter,  September 18, 2006 11:23 AM  Follow-up for Phone Call        Spoke with pt -she states she has been feeling fatigued for approx one month.  States she has also noticed that she has lost quite a bit of weight, not sure how much.  Her appetite is good - per pt "eats like a horse all day long".  Scheduled pt to come in 09/25/06 with Dr. Beverely Low. Follow-up by: AMY MARTIN RN,  September 18, 2006 11:51 AM

## 2010-03-01 NOTE — Assessment & Plan Note (Signed)
Summary: problems w/hand/eo   Vital Signs:  Patient Profile:   77 Years Old Female Weight:      133 pounds Pulse rate:   74 / minute BP sitting:   115 / 70  Vitals Entered By: Lillia Pauls CMA (September 14, 2007 8:40 AM)                 Chief Complaint:  R HAND AND SHOULDER PAIN; DYSURIA.  History of Present Illness: 77 yo woman w/  1) R hand numbness, tingling and pain- unable to tell me how long sxs have been present but states lately she notices that her writing will look worse as she continues to write due to hand fatigue and weakness.  Some pain in R shoulder at night but this improves with motion.  Advil provides some relief  2) Itching- pt states she is itching all over but it is worse on back along bra line and along R hip.  bathes numerous times daily.  has dry skin.  not applying lotion.  no pets, no one has similar sxs  3) Dysuria- intermittant x1 month.  denies hematuria, frequency.  no suprapubic pain, flank pain, or fevers.  Of note- pt is having a much harder time providing a hx at her last 2 visits.  Daughter has voiced concern over pt's memory deficits.  Pt is very dismissive of these concerns.    Current Allergies: * SULFA (SULFONAMIDES) GROUP   Social History:    Reviewed history from 03/27/2006 and no changes required:       Lives with a friend in [place], 2 children.  Smoked since 59 yo. Walks at least a mile every day. No etoh. Active in church.   Risk Factors:     Counseled to quit/cut down tobacco use:  yes   Review of Systems      See HPI   Physical Exam  General:     Well-developed,well-nourished,in no acute distress; alert,appropriate and cooperative throughout examination, difficult time providing hx Abdomen:     Bowel sounds positive,abdomen soft and non-tender without masses, organomegaly or hernias noted.  No CVA or suprapubic tenderness Msk:     FROM of bilateral shoulders, strength 4/5. Pulses:     +2 radial pulses Extremities:   + phalen's and tinnel's on R Skin:     Intact without suspicious lesions or rashes, areas of hyperpigmentation in groin creases bilaterally Psych:     memory impairment.      Impression & Recommendations:  Problem # 1:  DYSURIA (ICD-788.1) Assessment: Unchanged Pt w/ UA strongly suggestive of UTI.  Given pt's age will cx urine but will treat w/ Keflex.  If abx change is needed will notify pt. The following medications were removed from the medication list:    Doxycycline Hyclate 100 Mg Tabs (Doxycycline hyclate) .Marland Kitchen... 1 by mouth two times a day for 10 days  Her updated medication list for this problem includes:    Keflex 250 Mg Caps (Cephalexin) .Marland Kitchen... 1 tab by mouth three times a day x 7 days  Orders: Urinalysis-[facility] (00000) Urine Culture-[facility] (64403-47425) [facility]- Est  Level 4 (95638) Miscellaneous Lab Charge-[facility] (75643)   Problem # 2:  PRURITUS (ICD-698.9) Assessment: New Pt w/ itching of her skin but no lesions apparent.  Pt bathes multiple times a day which dries out skin- advised moisturizing and bathing less frequently.  Gave pt hydrocortisone cream to apple to affected areas. Orders: [facility]- Est  Level 4 (32951)   Problem #  3:  WRIST PAIN, RIGHT (ZOX-096.04) Assessment: New Pt w/ carpal tunnel sxs on PE.  Script given for cock up wrist splint.  Pt states she won't be able to get splint until SSI check comes.  Pt to take NSAIDs for pain relief and wear splint as able.  Pt to f/u in sports med clinic. Orders: [facility]- Est  Level 4 (54098)   Problem # 4:  MEMORY LOSS (ICD-780.93) Assessment: New Pt having more noticeable difficulty w/ memory.  Last 2 visits pt has had trouble providing hx.  Given pt's outgoing and pleasant attitude pt has likely been masking her sxs for awhile.  Daughter has expressed concern.  Will schedule pt in [facility] (daughter notified of appt) to have complete geriatric evaluation and assessment. Orders: [facility]- Est  Level 4 (99214)   Complete  Medication List: 1)  Ultram 50 Mg Tabs (Tramadol hcl) .Marland Kitchen.. 1 tab q6 as needed for pain 2)  Lidoderm 5 % Ptch (Lidocaine) .... Apply 1 patch for up to 12 hours daily, cut to appropriate size- dispense 1 box 3)  Keflex 250 Mg Caps (Cephalexin) .Marland Kitchen.. 1 tab by mouth three times a day x 7 days 4)  Hydrocortisone 2.5 % Oint (Hydrocortisone) .... Apply to affected area twice daily as needed for itching.  disp 1 large container   Patient Instructions: 1)  Please schedule in Sports Med clinic in 4 weeks 2)  Please schedule in [facility] ASAP for complete evaluation 3)  Use the wrist splint as much as possible to help with your pain and numbness 4)  Take Tylenol and Ibuprofen every 6 hours as needed for pain 5)  Apply moisturizing cream to entire body- especially after bathing 6)  You have a urinary tract infection- take the Keflex three times a day for 7 days as directed   Prescriptions: HYDROCORTISONE 2.5 %  OINT (HYDROCORTISONE) apply to affected area twice daily as needed for itching.  Disp 1 large container  #1 x 3   Entered and Authorized by:   Neena Rhymes  MD   Signed by:   Neena Rhymes  MD on 09/14/2007   Method used:   Electronically sent to ...       [facility]  [address] [phone]*       [address]       [place], [place]  [address]       Ph: [phone] or [phone]       Fax: [phone]   RxID:   [id number] KEFLEX 250 MG  CAPS (CEPHALEXIN) 1 tab by mouth three times a day x 7 days  #21 x 0   Entered and Authorized by:   Neena Rhymes  MD   Signed by:   Neena Rhymes  MD on 09/14/2007   Method used:   Electronically sent to ...       [facility]  [address] [phone]*       [address]       [place], [place]  [address]       Ph: [phone] or [phone]       Fax: [phone]   RxID:   [id number]  ] Laboratory Results   Urine Tests  Date/Time Received: September 14, 2007 8:51 AM  Date/Time Reported: September 14, 2007 9:10 AM   Routine Urinalysis   Color: yellow Appearance: Clear Glucose: negative   (  Normal Range: Negative) Bilirubin: negative   (Normal Range: Negative) Ketone: negative   (Normal Range: Negative) Spec. Gravity: 1.025   (Normal Range: 1.003-1.035) Blood: negative   (Normal Range: Negative) pH: 5.5   (Normal Range: 5.0-8.0) Protein: negative   (Normal Range: Negative) Urobilinogen: 0.2   (Normal Range: 0-1) Nitrite: positive   (Normal Range: Negative) Leukocyte Esterace: small   (Normal Range: Negative)  Urine Microscopic WBC/HPF: 15-25 RBC/HPF: occ Bacteria/HPF: 3+ Epithelial/HPF: 4-8    Comments: 3 cc spun

## 2010-03-01 NOTE — Miscellaneous (Signed)
Summary: Orders Update  Clinical Lists Changes  Problems: Added new problem of MACROCYTIC ANEMIA (ICD-281.9) - Signed Orders: Added new Test order of B12-[facility] [phone]) - Signed Added new Test order of Folate-[facility] [phone]) - Signed  Large MCV 103, will check B12 and Folate, pt also has symptoms consistent with peripheral neuropathy in  right hand.

## 2010-03-01 NOTE — Assessment & Plan Note (Signed)
Summary: B12 SHOT/KH  Nurse Visit     Allergies: 1)  * Sulfa (Sulfonamides) Group     Medication Administration  Injection # 1:    Medication: Vit B12 1000 mcg    Diagnosis: ANEMIA, PERNICIOUS (ICD-281.0)    Route: IM    Site: R deltoid    Exp Date: 03/2010    Lot #: [id number]    Mfr: [employer]ent    Patient tolerated injection without complications    Given by: Jacki Cones RN (August 02, 2008 9:36 AM)  Orders Added: 1)  Vit B12 1000 mcg [J3420] 2)  Est Level 1- [facility] [56213]      Medication Administration  Injection # 1:    Medication: Vit B12 1000 mcg    Diagnosis: ANEMIA, PERNICIOUS (ICD-281.0)    Route: IM    Site: R deltoid    Exp Date: 03/2010    Lot #: [id number]    Mfr: [employer]ent    Patient tolerated injection without complications    Given by: Jacki Cones RN (August 02, 2008 9:36 AM)  Orders Added: 1)  Vit B12 1000 mcg [J3420] 2)  Est Level 1- [facility] [08657]

## 2010-03-01 NOTE — Assessment & Plan Note (Signed)
Summary: b12/wp  Nurse Visit   Vitals Entered ByJacki Cones RN (December 04, 2007 9:47 AM)                 Prior Medications: HYDROCORTISONE 2.5 %  OINT (HYDROCORTISONE) apply to affected area twice daily as needed for itching.  Disp 1 large container SIMVASTATIN 40 MG TABS (SIMVASTATIN) one qhs COBAL-1000 1000 MCG/ML SOLN (CYANOCOBALAMIN) daily X5 then weekely X4, then monthly Current Allergies: * SULFA (SULFONAMIDES) GROUP    Medication Administration  Injection # 1:    Medication: Vit B12 1000 mcg    Diagnosis: ANEMIA, PERNICIOUS (ICD-281.0)    Route: IM    Site: R deltoid    Exp Date: 07/2009    Lot #: [id number]    Mfr: [employer]ent    Comments: Pt instructed to return to clinic for next vit b12 in 1 month.    Patient tolerated injection without complications    Given by: AMY MARTIN RN (December 04, 2007 9:48 AM)  Orders Added: 1)  Vit B12 1000 mcg [J3420] 2)  Est Level 1- [facility] [95284]    ]  Medication Administration  Injection # 1:    Medication: Vit B12 1000 mcg    Diagnosis: ANEMIA, PERNICIOUS (ICD-281.0)    Route: IM    Site: R deltoid    Exp Date: 07/2009    Lot #: [id number]    Mfr: [employer]ent    Comments: Pt instructed to return to clinic for next vit b12 in 1 month.    Patient tolerated injection without complications    Given by: AMY MARTIN RN (December 04, 2007 9:48 AM)  Orders Added: 1)  Vit B12 1000 mcg [J3420] 2)  Est Level 1- [facility] [40102]

## 2010-03-01 NOTE — Assessment & Plan Note (Signed)
Summary: med check wp   Vital Signs:  Patient Profile:   77 Years Old Female Weight:      138.7 pounds Temp:     98.4 degrees F oral Pulse rate:   94 / minute BP sitting:   116 / 64  (left arm)  Pt. in pain?   no  Vitals Entered By: Garen Grams LPN (March 11, 2007 8:38 AM)                  Chief Complaint:  cough and congestion.  History of Present Illness: 77 yo F with:  1. cough - x2 weeks. smoker - not interesetd in quitting.  cough is productive.  bothers her a lot.  no fever but does c/o chills, tiredness.  still active, walking.  gained 4 lbs.  no ear pain, sore throat, nausea, cvomiting, diarrhea, constipation.    2. well woman - offered pap smear but refused, advised that I recommend this for cancer screening.  unsure where GI work up left off - we do have inital office note.  willing to do mammogram after we discussed risks of cancer.      Current Allergies: * SULFA (SULFONAMIDES) GROUP      Physical Exam  General:     Well-developed,well-nourished,in no acute distress; alert,appropriate and cooperative throughout examination Ears:     External ear exam shows no significant lesions or deformities.  Otoscopic examination reveals clear canals, tympanic membranes are intact bilaterally without bulging, retraction, inflammation or discharge. Hearing is grossly normal bilaterally. Nose:     External nasal examination shows no deformity or inflammation. Nasal mucosa are pink and moist without lesions or exudates. Mouth:     Oral mucosa and oropharynx without lesions or exudates.  Teeth in good repair. Neck:     No deformities, masses, or tenderness noted. Lungs:     Normal respiratory effort, chest expands symmetrically. Lungs slightly decreased breath sounds, no crackles or wheezes. Heart:     Normal rate and regular rhythm. S1 and S2 normal without gallop, murmur, click, rub or other extra sounds. Abdomen:     Bowel sounds positive,abdomen soft and  non-tender without masses, organomegaly or hernias noted. Msk:     no edema    Impression & Recommendations:  Problem # 1:  ACUTE BRONCHITIS (ICD-466.0) Assessment: New will h/o smoker and being elderly, will be more careful with productive cough.  ?COPD exacerbation.  Tx wtih abx, prednionse.  gave red flags.   Her updated medication list for this problem includes:    Doxycycline Hyclate 100 Mg Tabs (Doxycycline hyclate) .Marland Kitchen... 1 by mouth two times a day for 10 days  Orders: [facility]- Est Level  3 (16109)   Problem # 2:  TOBACCO DEPENDENCE (ICD-305.1) Assessment: Unchanged advised to quit. Orders: [facility]- Est Level  3 (60454)   Problem # 3:  Preventive Health Care (ICD-V70.0) Assessment: Unchanged will have asha call gi to see what needs to be done next.  will schedule mammogram.   Complete Medication List: 1)  Lidoderm 5 % Ptch (Lidocaine) .... Apply 1 patch to skin every four hours 2)  Ultram 50 Mg Tabs (Tramadol hcl) .Marland Kitchen.. 1 tab q6 as needed for pain 3)  Lidoderm 5 % Ptch (Lidocaine) .... Apply 1 patch for up to 12 hours daily, cut to appropriate size- dispense 1 box 4)  Doxycycline Hyclate 100 Mg Tabs (Doxycycline hyclate) .Marland Kitchen.. 1 by mouth two times a day for 10 days 5)  Prednisone 20 Mg  Tabs (Prednisone) .... 2 by mouth daily for 10 days   Patient Instructions: 1)  Bronchitis - take Doxycycline twice a day for 10 days.  Take two prednisone tablets twice a day for 10 days.  Return to clinic if you get a fever, have problems breathing, feel a lot worse. 2)  Renard Hamper will schedule you a mammogram and figure out how to continue with your colonoscopy.    Prescriptions: PREDNISONE 20 MG  TABS (PREDNISONE) 2 by mouth daily for 10 days  #20 x 0   Entered and Authorized by:   Rolm Gala MD   Signed by:   Rolm Gala MD on 03/11/2007   Method used:   Print then Give to Patient   RxID:   [id number] DOXYCYCLINE HYCLATE 100 MG  TABS (DOXYCYCLINE HYCLATE) 1 by mouth two times a  day for 10 days  #20 x 0   Entered and Authorized by:   Rolm Gala MD   Signed by:   Rolm Gala MD on 03/11/2007   Method used:   Print then Give to Patient   RxID:   [id number]  ]  Vital Signs:  Patient Profile:   77 Years Old Female Weight:      138.7 pounds Temp:     98.4 degrees F oral Pulse rate:   94 / minute BP sitting:   116 / 64

## 2010-03-01 NOTE — Assessment & Plan Note (Signed)
Summary: cpe,tcb   Vital Signs:  Patient profile:   77 year old female Height:      67.0 inches Weight:      135.0 pounds BMI:     21.22 Temp:     97.6 degrees F Pulse rate:   70 / minute BP sitting:   99 / 66  (right arm)  Vitals Entered By: Starleen Blue RN (November 16, 2008 9:43 AM) CC: cpe Is Patient Diabetic? No Pain Assessment Patient in pain? no        Primary Care Kailo Kosik:  Ardeen Garland  MD  CC:  cpe.  History of Present Illness: Joann [name] comes in for complete physical today.  Feeling well.  Shoulder doing much better since injection.  No complaints. Needs mammogram.   Hyst in 07/03/69, no pap needed. Due for bloodwork but not fasting. Still smoking.  Knows she should quit but not ready to quit.    Habits & Providers  Alcohol-Tobacco-Diet     Tobacco Status: current     Cigarette Packs/Day: <0.25  Allergies: 1)  * Sulfa (Sulfonamides) Group  Past History:  Past Medical History: Last updated: 03/27/2006 cataract removal 6/03, hysterectomy 1971, started fish oil capusles 4-06  Family History: Last updated: October 12, 2007 brother died of lung CA Jul 04, 1978 Father died of MI mother died of kidney failure sister died of stomach CA at 84 sister with breast ca  Social History: Last updated: 10-12-2007 Lives with a friend in [place]  2 children.   Smoked since 80 yo. Walks at least a mile every day.  No etoh.  Active in church. 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  Social History: Packs/Day:  <0.25  Physical Exam  General:  thin, alert, well-groomed NAD vitals reviewed Eyes:  pupils equal, pupils round, pupils reactive to light, and corneas and lenses clear.   Ears:  External ear exam shows no significant lesions or deformities.  Otoscopic examination reveals clear canals, tympanic membranes are intact bilaterally without bulging, retraction, inflammation or discharge. Hearing is grossly normal bilaterally. Nose:   External nasal examination shows no deformity or inflammation. Nasal mucosa are pink and moist without lesions or exudates. Mouth:  Oral mucosa and oropharynx without lesions or exudates.  Teeth in good repair. Neck:  No deformities, masses, or tenderness noted. Breasts:  No mass, nodules, thickening, tenderness, bulging, retraction, inflamation, nipple discharge or skin changes noted.   Lungs:  Normal respiratory effort, chest expands symmetrically. Lungs are clear to auscultation, no crackles or wheezes. Heart:  Normal rate and regular rhythm. S1 and S2 normal without gallop, murmur, click, rub or other extra sounds. Abdomen:  Bowel sounds positive,abdomen soft and non-tender without masses, organomegaly or hernias noted. Pulses:  2+ radial and dp pulses Extremities:  no edema Skin:  1cm by 1 cm inflamed sebaceous cyst over right deltoid.  Mildly tender.  Stated turned red after scratching at it.  Erythematous and indurated.  Slight fluctuance. Cervical Nodes:  No lymphadenopathy noted Axillary Nodes:  No palpable lymphadenopathy Psych:  Cognition and judgment appear intact. Alert and cooperative with normal attention span and concentration. No apparent delusions, illusions, hallucinations   Impression & Recommendations:  Problem # 1:  PHYSICAL EXAMINATION (ICD-V70.0) Assessment Unchanged  Benign exam.  Gave number to schedule mammo at earliest convenience.  She will return fasting for bloodwork.   Orders: [facility] - Est  65+ [phone])  Problem # 2:  SEBACEOUS CYST (ICD-706.2) Given it appears infected, will not I&D  today.  No systemic symptoms.  Gave doxy for 14 days.  Discussed warnign signs to return for further evaluation.   Complete Medication List: 1)  Hydrocortisone 2.5 % Oint (Hydrocortisone) .... Apply to affected area twice daily as needed for itching.  disp 1 large container 2)  Simvastatin 40 Mg Tabs (Simvastatin) .... One qhs 3)  Cobal-1000 1000 Mcg/ml Soln (Cyanocobalamin)  .... Daily x5 then weekely x4, then monthly 4)  Doxycycline Hyclate 100 Mg Caps (Doxycycline hyclate) .Marland Kitchen.. 1 tab by mouth two times a day for 14  day.  Other Orders: Future Orders: Comp Met-[facility] ([id number]) ... 11/08/2009 Lipid-[facility] ([id number]) ... 11/08/2009 CBC-[facility] ([id number]) ... 11/08/2009  Patient Instructions: 1)  You can come back any morning that work for you to have your labwork drawn.  We open at 8:30.  Just tell the front desk on your way out what day you would like to come in and they will put you on the schedule.  Please don't eat anything that morning.  You can have water to drink or black coffee (no sugar or cream) but nothing else to eat or drink. 2)  Please call [facility] to schedule your mammogram at your earliest convenience. 3)  Please take the antibiotic twice a day for the next 2 weeks.  If the area gets bigger, redder, or more painful or doesn't go away, please come back in to have it looked at.  It may need drained.  Prescriptions: DOXYCYCLINE HYCLATE 100 MG CAPS (DOXYCYCLINE HYCLATE) 1 tab by mouth two times a day for 14  day.  #28 x 0   Entered and Authorized by:   Ardeen Garland  MD   Signed by:   Ardeen Garland  MD on 11/16/2008   Method used:   Print then Give to Patient   RxID:   [id number]

## 2010-03-01 NOTE — Miscellaneous (Signed)
Summary: Orders Update-resume statin  Clinical Lists Changes  Medications: Added new medication of SIMVASTATIN 40 MG TABS (SIMVASTATIN) one qhs - Signed Rx of SIMVASTATIN 40 MG TABS (SIMVASTATIN) one qhs;  #30 x 2;  Signed;  Entered by: Luretha Murphy NP;  Authorized by: Luretha Murphy NP;  Method used: [facility] to [facility] [facility] [address] [phone]*, [address] [place], [place], [place]  [address], Ph: [phone] or [phone], Fax: [phone]    Prescriptions: SIMVASTATIN 40 MG TABS (SIMVASTATIN) one qhs  #30 x 2   Entered and Authorized by:   Luretha Murphy NP   Signed by:   Luretha Murphy NP on 09/21/2007   Method used:   [facility]ronically to        [facility]  [address] [phone]* (retail)       [address]       [place], [place]  [address]       Ph: [phone] or [phone]       Fax: [phone]   RxID:   [id number]

## 2010-06-15 NOTE — H&P (Signed)
NAME:  Joann [name], Joann [name]                  ACCOUNT NO.:  [id number]   MEDICAL RECORD NO.:  [id number]          PATIENT TYPE:  INP   LOCATION:  4712                         FACILITY:  [facility]   PHYSICIAN:  Asencion Partridge, M.D.     DATE OF BIRTH:  1928-01-01   DATE OF ADMISSION:  11/24/2004  DATE OF DISCHARGE:                                HISTORY & PHYSICAL   CHIEF COMPLAINT:  Left abdominal pain.   Ms. Barhorst is a 77 year old female with history of hyperlipidemia, tobacco  abuse and remote vertigo that presented to [facility] after a 7-10 minute  episode of left-sided chest pain.  The patient had just got out of bed the  morning of admission.  After pain resolved, the patient drank two to three  sips of coffee, then developed nausea, vomiting with diaphoresis.  The  patient had never felt this type of pain before.  Pain has not recurred  since initial episode, neither has nausea or vomiting.  The patient denies  recent URI symptoms.  When asked where pain was located, the patient points  to her left flank.   REVIEW OF SYSTEMS:  Patient denies fevers, chills, unintentional weight gain  or loss.  No nausea, vomiting presently.  Patient denies chest pain with  exertion or at rest.  Denies shortness of breath, denies cough, no abdominal  pain.  Denies reflux.  Patient does report bladder fullness, denies rashes.   PAST MEDICAL HISTORY:  1.  Hypercholesterolemia.  2.  Tobacco abuse.  3.  Remote history of vertigo.  4.  Osteopenia.  5.  Hysterectomy, 1971.  6.  Cataract removal 2003.   MEDICATIONS:  1.  Occasional calcium plus vitamin D tablet.  2.  Multivitamin.   ALLERGIES:  SULFA DRUGS CAUSE RASH.   FAMILY HISTORY:  Father died of MI and mother died of kidney failure.  Patient has a sister with breast cancer.  Brother died of lung cancer at 54  years of age.  Sister died of stomach cancer at 20 years of age.   SOCIAL HISTORY:  The patient lives alone in [place], has two children  in  the area.  She has smoked since 77 years of age and currently smokes a pack  and a half every two days.  Patient is very active, walks at least a mile  every day.  The patient denies alcohol and illicit drug use.   PHYSICAL EXAMINATION:  VITAL SIGNS:  Temperature 98.4, heart rate 68,  respiration rate 18, O2 saturation 100% on room air, awaiting blood  pressure.  GENERAL:  Alert and oriented x3 in no apparent distress; talkative.  HEENT:  Atraumatic, normocephalic.  No thyromegaly, no lymphadenopathy.  Neck full range of motion without stiffness.  PERRL, EOMI.  Oral mucosa  moist.  Oropharynx clear.  No lesions appreciated in oropharynx.  No upper  dentition.  CARDIOVASCULAR:  Chest nontender to palpation.  Regular rate and rhythm.  No  murmurs, gallops, or rubs.  No JVD.  LUNGS:  Clear to auscultation bilaterally.  ABDOMEN:  Soft, nondistended, positive bowel sounds  all 4 quadrants.  Mild  tenderness over suprapubic area and mild CVA tenderness left flank.  No  guarding.  No rebound tenderness.  EXTREMITIES:  No extremity edema.  NEURO EXAM:  5/5 muscle strength upper and lower extremities.  Cranial  nerves II-XII intact.  Appropriate affect, Glasgow Coma Scale 15.   LABORATORIES AND TESTS:  Sodium 136, potassium 4.5, chloride 103, bicarb 28,  BUN 7, creatinine 0.8, glucose 112, calcium 9.1, WBC 2.9, hemoglobin 13.4,  hematocrit 40, platelets 212,000.  MCV 983.0.  Urinalysis:  Mild leukocyte  esterase, nitrate positive.  Micro:  3-6 WBCs, many bacteria.  Point-of-care  enzymes:  CK-MB 1.1, troponin less than 0.05, myoglobin 78.2.  Chest x-ray:  No acute findings.  Awaiting official read.   ASSESSMENT AND PLAN:  This is a 77 year old female with acute left flank  tenderness.   1.  Abdominal pain.  This presentation is atypical for cardiac etiology.      Point-of-care enzymes negative x1 set.  Chest x-ray and      electrocardiogram not worrisome for cardiac event.  Will continue  to      rule out cardiac event with second and third set cardiac enzymes.  Will      get lipid panel to help with risk stratification.  Patient does have      history of hypercholesterolemia, however, she is not compliant with      medication.  2.  Differential diagnosis also includes gastroesophageal reflux.  We will      support with Protonix 40 mg p.o. daily and monitor for improvement of      pain.  Must consider pyelonephritis considering patient's urinalysis, as      well as costovertebral angle tenderness, as well as suprapubic      tenderness.  Will start intravenous ciprofloxacin for urinary tract      infection versus pyelonephritis by clinical exam.  Will monitor for      fever and increase in white blood count.  3.  Increased white blood cells may be secondary to possible urinary tract      infection.  Will monitor with repeat complete blood count in the a.m.      and follow.  4.  Fluids, electrolytes, and nutrition/gastrointestinal.  Will continue      intravenous fluids.  Patient has good appetite and eating well.  Will      decrease fluids as appropriate.  Start Protonix for pain and      gastrointestinal prophylaxis.  5.  Deep vein thrombosis prophylaxis.  Place sequential compression device.  6.  Disposition pending clinical improvement and rule out cardiac etiology      of #1.  Will also need to await culture and sensitivity of urine.      Morley Kos, M.D.    ______________________________  Asencion Partridge, M.D.    VRE/MEDQ  D:  11/25/2004  T:  11/25/2004  Job:  [id number]

## 2010-06-15 NOTE — Discharge Summary (Signed)
NAME:  Joann [name], SEALES                  ACCOUNT NO.:  [id number]   MEDICAL RECORD NO.:  [id number]          PATIENT TYPE:  INP   LOCATION:  4712                         FACILITY:  [facility]   PHYSICIAN:  Asencion Partridge, M.D.     DATE OF BIRTH:  1927-12-19   DATE OF ADMISSION:  11/24/2004  DATE OF DISCHARGE:  11/27/2004                                 DISCHARGE SUMMARY   PRIMARY CARE PHYSICIAN:  Dr. Ermalene Searing at [facility].   DISCHARGE DIAGNOSES:  1.  Chest pain.  2.  Urinary tract infection with possibly pyelonephritis.  3.  Tobacco abuse.  4.  Leukopenia.   IMAGING STUDIES:  Chest x-ray on November 24, 2004 showed no acute findings.   LABORATORY DATA ON ADMISSION:  Sodium 137, potassium 3.9, chloride 108,  bicarb 24, BUN 10, creatinine 0.7, glucose 81.  ALT 10, AST 13.  White blood  cell count 2.5, hemoglobin 13.4, hematocrit 40.0, platelets 210, MCV 93.0.  Urinalysis showed small leukocyte esterase, positive nitrite, 3-6 white  blood cells, and many bacteria.  Point-of-care cardiac enzymes were negative  x2.  TSH 1.612.  Lipid panel revealed a cholesterol of 194, triglycerides  158, HDL 44, LDL 118.  EKG showed sinus bradycardia at 53 beats per minute.  Urine culture showed greater than 100,000 colonies of E coli which were  sensitive to ciprofloxacin.   CONSULTATIONS:  None.   PROCEDURES:  None.   HISTORY OF PRESENT ILLNESS:  The patient is a 77 year old female with  history of hyperlipidemia as well as extensive smoking history who presented  after a 7 to 10-minute episode of left-sided chest pain followed by nausea,  vomiting, and diaphoresis.  The patient was not having chest pain at the  time of arrival to the emergency department and was without nausea and  vomiting at the time.  The patient was noted to have positive left-sided  flank pain as well as suprapubic tenderness.   HOSPITAL COURSE:  Problem 1.  Chest pain.  The patient was admitted.  Point-  of-care enzymes and EKG were negative for cardiac etiology.  The patient was  treated for possible GERD with Protonix.  Will follow up on possible GERD as  an outpatient.   Problem 2.  Urinary tract infection/possible pyelonephritis.  The patient's  urine culture grew out greater than 100,000 colonies of E coli.  The patient  was started on Cipro 500 mg IV b.i.d. and was changed to oral ciprofloxacin  500 mg p.o. b.i.d. prior to discharge.  The patient is to complete a 10-day  course of antibiotics.  The patient's left-sided flank pain improved over  the course of admission, and the patient was without fever, nausea, or  vomiting during her admission.   Problem 3.  Leukopenia.  The patient's white blood cell count was 2.5, and  this was consistent throughout her hospital course.  The patient states that  she has a history of this.  We recommend checking old records as well as  monitoring her white blood cell count as an outpatient.  Problem 4.  Tobacco abuse.  The patient declined smoking cessation while in  the hospital.   DISCHARGE MEDICATIONS:  1.  Ciprofloxacin 500 mg by mouth two times daily for a total of 10 days.  2.  Calcium plus vitamin D one tablet two times daily.  3.  Multivitamin once daily.   DISCHARGE INSTRUCTIONS:  The patient is to return to the hospital for any  fever, nausea, vomiting, worsening flank pain, as well as any worsening  chest pain or shortness of breath.   FOLLOW UP:  The patient is to follow up with Dr. Ermalene Searing at the [facility], phone number [phone], on December 06, 2004 at 8:30  a.m.      Benn Moulder, M.D.    ______________________________  Asencion Partridge, M.D.    MR/MEDQ  D:  11/27/2004  T:  11/27/2004  Job:  [id number]   cc:   Ermalene Searing, M.D.  [facility]

## 2010-09-19 ENCOUNTER — Ambulatory Visit (INDEPENDENT_AMBULATORY_CARE_PROVIDER_SITE_OTHER): Payer: PRIVATE HEALTH INSURANCE | Admitting: Family Medicine

## 2010-09-19 ENCOUNTER — Encounter: Payer: Self-pay | Admitting: Family Medicine

## 2010-09-19 VITALS — BP 128/75 | HR 62 | Wt 142.0 lb

## 2010-09-19 DIAGNOSIS — R1031 Right lower quadrant pain: Secondary | ICD-10-CM | POA: Insufficient documentation

## 2010-09-19 LAB — POCT URINALYSIS DIPSTICK
Glucose, UA: NEGATIVE
Nitrite, UA: POSITIVE
Protein, UA: NEGATIVE
Spec Grav, UA: >=1.03
Urobilinogen, UA: 0.2
pH, UA: 5.5

## 2010-09-19 LAB — POCT UA - MICROSCOPIC ONLY

## 2010-09-19 MED ORDER — CEPHALEXIN 500 MG PO CAPS: 500.0000 mg | ORAL_CAPSULE | Freq: Two times a day (BID) | ORAL | Status: AC

## 2010-09-19 NOTE — Patient Instructions (Signed)
Your groin pain is most likely from your hip. We will get xray today. You also have infection in your urine. Please take antibiotics. Call if you develop fevers, stomach pain, nausea, diarrhea or worsened pain.

## 2010-09-20 ENCOUNTER — Ambulatory Visit
Admission: RE | Admit: 2010-09-20 | Discharge: 2010-09-20 | Disposition: A | Discharge status: Home / Self Care | Payer: PRIVATE HEALTH INSURANCE | Source: Ambulatory Visit | Attending: Family Medicine | Admitting: Family Medicine

## 2010-09-20 DIAGNOSIS — R1031 Right lower quadrant pain: Secondary | ICD-10-CM

## 2010-09-20 NOTE — Progress Notes (Signed)
  Subjective:    Patient ID: Joann [name], female    DOB: 1927-09-03, 77 y.o.   MRN: [id number]  HPI 1. Right groin pain. Has felt a pain in her right anterior groin with walking for the past week. First noticed on a walk to the mailbox. She is limping. Relieved by rest and sitting. Does not recall any injury or trauma to the area. Denies falls. Lives with a friend but is independent in ADLs. She denies any vaginal discharge, dysuria, hematuria, abdominal pain, n/v/d, hematochezia, fevers.   Review of Systems Positive for right knee pain and left flank pain. Denies leg swelling, rash, CP.     Objective:   Physical Exam  Vitals reviewed. Constitutional: She is oriented to person, place, and time. She appears well-developed and well-nourished. No distress.  HENT:  Head: Normocephalic and atraumatic.  Eyes: EOM are normal. Pupils are equal, round, and reactive to light.  Cardiovascular: Normal rate and regular rhythm.   No murmur heard. Pulmonary/Chest: Effort normal.  Abdominal: Soft. Bowel sounds are normal. She exhibits no distension. There is no rebound and no guarding.       Mild TTP in LLQ/inguinal area.  Musculoskeletal: She exhibits tenderness. She exhibits no edema.       TTP at right iliac crest and anterior hip. Mild/moderate pain with ext and internal rotation of hip. Negative straight leg testing. No spine tenderness. + left CVA tenderness.  2+ bilateral femoral pulses.   Gait mildly antalgic.   Neurological: She is alert and oriented to person, place, and time. Coordination normal.          Assessment & Plan:

## 2010-09-20 NOTE — Assessment & Plan Note (Signed)
Most likely caused by hip degenerative changes as pain is related to walking. Has some incongruent PE findings (CVA tenderness and RLQ tenderness) that raise possibility of alternative diagnosis. Less likely related to UTI or renal stone. Given her vague abdominal symptoms, UA and risk, will treat with 3 days keflex and culture urine. Check hip plain film to evaluate level of degenerative change. Continue symptomatic pain treatment with motrin and follow up in one week if not improved.

## 2010-09-21 LAB — URINE CULTURE: Colony Count: >=100000

## 2010-09-24 ENCOUNTER — Telehealth: Payer: Self-pay | Admitting: Family Medicine

## 2010-09-24 DIAGNOSIS — M169 Osteoarthritis of hip, unspecified: Secondary | ICD-10-CM

## 2010-09-24 HISTORY — DX: Osteoarthritis of hip, unspecified: M16.9

## 2010-09-24 NOTE — Telephone Encounter (Signed)
Called patient to discuss hip/pelvic xray. Findings of moderate OA in bilateral hips with spurs. Most likely the cause of her right groin pain. She has been dealing with the pain taking motrin and moving slower than usual. Advised OK to take tylenol for pain in addition to motrin. She may follow up prn or if she would like more aggressive treatment we may refer for intra-articular steroid injection. PATP.

## 2011-02-15 ENCOUNTER — Ambulatory Visit: Payer: PRIVATE HEALTH INSURANCE | Admitting: Family Medicine

## 2011-02-27 ENCOUNTER — Ambulatory Visit (INDEPENDENT_AMBULATORY_CARE_PROVIDER_SITE_OTHER): Payer: PRIVATE HEALTH INSURANCE | Admitting: Family Medicine

## 2011-02-27 ENCOUNTER — Encounter: Payer: Self-pay | Admitting: Family Medicine

## 2011-02-27 VITALS — BP 127/68 | HR 75 | Temp 98.0°F | Ht 67.0 in | Wt 150.0 lb

## 2011-02-27 DIAGNOSIS — M549 Dorsalgia, unspecified: Secondary | ICD-10-CM

## 2011-02-27 LAB — POCT URINALYSIS DIPSTICK
Bilirubin, UA: NEGATIVE
Blood, UA: NEGATIVE
Glucose, UA: NEGATIVE
Ketones, UA: NEGATIVE
Leukocytes, UA: NEGATIVE
Nitrite, UA: NEGATIVE
Protein, UA: NEGATIVE
Spec Grav, UA: 1.01
Urobilinogen, UA: 0.2
pH, UA: 6.5

## 2011-02-27 NOTE — Patient Instructions (Signed)
YOu can take 1000mg  (2 500mg  tabs) three times a day Come back if you get fevers or if the pain gets worse  Come see [place] next Thursday morning.

## 2011-02-27 NOTE — Assessment & Plan Note (Signed)
Possibly due to pulled muscle from coughing last week. No one tender spot so I do not think this is due to an osteoporotic fracture. Urine was normal so very likely to be pyelonephritis. Will see back on Thursday of next week in geriatric clinic for recheck.

## 2011-02-27 NOTE — Progress Notes (Signed)
  Subjective:    Patient ID: Joann [name], female    DOB: 01-21-1928, 78 y.o.   MRN: [id number]  HPI  Patient presents today for complaint of lower back pain. She has pain across the low back but the left side is worse. This started on Friday. She has not done anything abnormal in terms of lifting or working. She has had a lot of cough over the last week and coughs a lot. She denies any injury or fall. She has not had any fevers and she describes no weakness. She does note that her urination has been more frequent and lasted 4 days and she seems to going very often. There is no burning when she pees.  Review of Systems    see above Objective:   Physical Exam Vital signs reviewed General appearance - alert, well appearing, and in no distress and oriented to person, place, and time MSK-back with full range of motion with no pain. There is some pain on palpation above the iliac crest on the left side of her back. Spine is nontender There no rashes. There are no muscle spasms felt. Gait is normal. Balance is normal.     Assessment & Plan:

## 2011-03-07 ENCOUNTER — Encounter: Payer: Self-pay | Admitting: Family Medicine

## 2011-03-07 ENCOUNTER — Ambulatory Visit (INDEPENDENT_AMBULATORY_CARE_PROVIDER_SITE_OTHER): Payer: PRIVATE HEALTH INSURANCE | Admitting: Family Medicine

## 2011-03-07 VITALS — BP 115/79 | HR 100 | Ht 67.0 in | Wt 147.9 lb

## 2011-03-07 DIAGNOSIS — E785 Hyperlipidemia, unspecified: Secondary | ICD-10-CM

## 2011-03-07 DIAGNOSIS — M949 Disorder of cartilage, unspecified: Secondary | ICD-10-CM

## 2011-03-07 DIAGNOSIS — F17200 Nicotine dependence, unspecified, uncomplicated: Secondary | ICD-10-CM

## 2011-03-07 DIAGNOSIS — M899 Disorder of bone, unspecified: Secondary | ICD-10-CM

## 2011-03-07 DIAGNOSIS — Z1211 Encounter for screening for malignant neoplasm of colon: Secondary | ICD-10-CM

## 2011-03-07 DIAGNOSIS — D510 Vitamin B12 deficiency anemia due to intrinsic factor deficiency: Secondary | ICD-10-CM

## 2011-03-07 LAB — CBC
HCT: 40.2 % (ref 36.0–46.0)
Hemoglobin: 13 g/dL (ref 12.0–15.0)
MCH: 28.3 pg (ref 26.0–34.0)
MCHC: 32.3 g/dL (ref 30.0–36.0)
MCV: 87.4 fL (ref 78.0–100.0)
Platelets: 213 10*3/uL (ref 150–400)
RBC: 4.6 MIL/uL (ref 3.87–5.11)
RDW: 13 % (ref 11.5–15.5)
WBC: 3.6 10*3/uL — ABNORMAL LOW (ref 4.0–10.5)

## 2011-03-07 LAB — COMPREHENSIVE METABOLIC PANEL
ALT: 10 U/L (ref 0–35)
AST: 21 U/L (ref 0–37)
Albumin: 4.4 g/dL (ref 3.5–5.2)
Alkaline Phosphatase: 62 U/L (ref 39–117)
BUN: 10 mg/dL (ref 6–23)
CO2: 25 mEq/L (ref 19–32)
Calcium: 9.4 mg/dL (ref 8.4–10.5)
Chloride: 104 mEq/L (ref 96–112)
Creat: 0.84 mg/dL (ref 0.50–1.10)
Glucose, Bld: 98 mg/dL (ref 70–99)
Potassium: 4.3 mEq/L (ref 3.5–5.3)
Sodium: 137 mEq/L (ref 135–145)
Total Bilirubin: 0.5 mg/dL (ref 0.3–1.2)
Total Protein: 6.7 g/dL (ref 6.0–8.3)

## 2011-03-07 LAB — LDL CHOLESTEROL, DIRECT: Direct LDL: 168 mg/dL — ABNORMAL HIGH

## 2011-03-07 LAB — VITAMIN B12: Vitamin B-12: 200 pg/mL — ABNORMAL LOW (ref 211–911)

## 2011-03-07 NOTE — Assessment & Plan Note (Signed)
Patient stopped her B12 injections. Recheck a B12 level today. Recheck a CBC as well since she has previously been macrocytic. Restart B12 if needed.

## 2011-03-07 NOTE — Assessment & Plan Note (Signed)
Discussed tobacco cessation with patient. She does not want to use any medications at this time. She says she will work on quitting.

## 2011-03-07 NOTE — Progress Notes (Signed)
  Subjective:    Patient ID: Joann [name], female    DOB: November 18, 1927, 78 y.o.   MRN: [id number]  HPI Stye on right lower eyelid- saw optho (Dr. Nile Riggs) yesterday and was given abx drops  No hearing complaints Vision- cataract surgery left eye in October.  Vision is good.  Does not drive--has never had a license  Mobility- has 3 room apt. No stairs in apt.  Has to hold on to something when she stands in case she gets dizzy.  Had an episode of vertigo 3-4 years ago but not since. No falls.   Does ADLs and IADLs--will walk to store or take a cab.  Memory intact for recent or remote. Lives at [place] housing project with a friend (female, does cooking and laundry)  No incontinence  No depression Is involved in community.  Visits nursing home pts and older people in her community.    Review of Systems     Objective:   Physical Exam  Vital signs reviewed General appearance - alert, well appearing, and in no distress and oriented to person, place, and time Skin - normal coloration and turgor, no rashes, no suspicious skin lesions noted Normal get up and go Full ROM in shoulders Heart - normal rate, regular rhythm, normal S1, S2, no murmurs, rubs, clicks or gallops Chest - clear to auscultation, no wheezes, rales or rhonchi, symmetric air entry, no tachypnea, retractions or cyanosis Ears - bilateral TM's and external ear canals normal, right ear normal, left ear normal   Hearing at 40 dB 1000, 2000, 500  Missed 4000      Assessment & Plan:

## 2011-03-07 NOTE — Patient Instructions (Signed)
It was nice to meet you today We are sending you for blood work today.   We will call if you need to start any medications for this.  Please come see [place] as needed

## 2011-03-07 NOTE — Assessment & Plan Note (Signed)
Check direct LDL today. Restart simvastatin as indicated.

## 2011-03-07 NOTE — Assessment & Plan Note (Signed)
Patient likely has a 10 year life expectancy so we'll screen for colon cancer with stool cards. If these show blood will send for colonoscopy

## 2011-03-08 LAB — VITAMIN D 25 HYDROXY (VIT D DEFICIENCY, FRACTURES): Vit D, 25-Hydroxy: 12 ng/mL — ABNORMAL LOW (ref 30–89)

## 2011-03-20 ENCOUNTER — Telehealth: Payer: Self-pay | Admitting: Family Medicine

## 2011-03-20 NOTE — Telephone Encounter (Signed)
Message copied by Reginold Agent on Wed Mar 20, 2011  3:46 PM ------      Message from: Zachery Dauer      Created: Tue Mar 19, 2011 12:09 PM      Regarding: vitamin D deficiency       Would you make sure she gets started on replacement      ----- Message -----         From: [facility]         Sent: 03/07/2011   9:54 PM           To: Tobin Chad, MD

## 2011-03-20 NOTE — Telephone Encounter (Signed)
Pt recently got labs done and had several abnormals that need treatment but are not urgent She needs to be on Vit D replacement, B12 replacement, and needs to be back on a statin. The easiest/best way to help her do all of this I think would be to have her come back to go over her labs with Dr. Edmonia James and decide how they want to proceed. Can you call the patient and ask her to make an appt with caviness for lab results?

## 2011-03-21 NOTE — Telephone Encounter (Signed)
Attempted to call pt.  Female answered the phone.  Advised that I was from Dr. Edmonia James office and I asked if Mr Risdon was there, she said no.  Asked if she knew when he would be available, so that I may call him back.  Was told that I was asking "awful personal questions and it was not appreciated".  Apologized and ask that she have Mr Darwish call us back. Kizzi Overbey, Maryjo Rochester

## 2011-03-22 NOTE — Telephone Encounter (Signed)
Ms. Joann [name] is a woman.

## 2011-04-24 ENCOUNTER — Encounter: Payer: Self-pay | Admitting: Family Medicine

## 2011-06-13 ENCOUNTER — Encounter: Payer: Self-pay | Admitting: Family Medicine

## 2011-06-13 ENCOUNTER — Ambulatory Visit (INDEPENDENT_AMBULATORY_CARE_PROVIDER_SITE_OTHER): Payer: PRIVATE HEALTH INSURANCE | Admitting: Family Medicine

## 2011-06-13 VITALS — BP 108/62 | HR 60 | Temp 99.2°F | Ht 67.0 in | Wt 143.0 lb

## 2011-06-13 DIAGNOSIS — M949 Disorder of cartilage, unspecified: Secondary | ICD-10-CM

## 2011-06-13 DIAGNOSIS — M169 Osteoarthritis of hip, unspecified: Secondary | ICD-10-CM

## 2011-06-13 DIAGNOSIS — E559 Vitamin D deficiency, unspecified: Secondary | ICD-10-CM

## 2011-06-13 DIAGNOSIS — M899 Disorder of bone, unspecified: Secondary | ICD-10-CM

## 2011-06-13 HISTORY — DX: Vitamin D deficiency, unspecified: E55.9

## 2011-06-13 MED ORDER — ERGOCALCIFEROL 1.25 MG (50000 UT) PO CAPS: 50000.0000 [IU] | ORAL_CAPSULE | ORAL | Status: DC

## 2011-06-13 NOTE — Patient Instructions (Signed)
Vitamin D- Take one tablet a week of 8 weeks  Use the maximum dose of tylenol- Tylenol arthritis is 2 caplets every 8 hours  If your pain is not improving or worsening, please let me know  Will order DEXA scan

## 2011-06-13 NOTE — Assessment & Plan Note (Signed)
Hip likely due to hip arthritis.  Given pain is bilateral, relieved with tylenol, less suspicious for fracture.  Reassured her she can take tylenol more frequently advised can take as the package insert says.  She feels confident this will make her pain manageable,  Advised to return if not relieved and will pursue further imaging.

## 2011-06-13 NOTE — Assessment & Plan Note (Signed)
Was tested with low vitamin d in feb 2013- not replaced.  Will rx vitamin D 50,000 units qweekly x 8 weeks.

## 2011-06-13 NOTE — Assessment & Plan Note (Signed)
History of osteopenia noted in chart, also noted on xray.  Has un-repleted vit D.  Will order DEXA to quantify and see if she would benefit from bisphosphonate therapy.

## 2011-06-13 NOTE — Progress Notes (Signed)
  Subjective:    Patient ID: Joann [name], female    DOB: [birth year], 78 y.o.   MRN: [id number]  HPI 4 days of bilateral low back and lower pelvic pain.  States at times is 10/10.  Constant.   Stable, not worsening.  Difficulty with walking and standing due to pain.  Bending over makes it worse.  No radiation to legs.  Described as achy.  No history of back trauma remote or recent.  Has tried Acetaminophen with improvement 650mg .  Only uses it once or twice a day- concerned about side effects but not experiencing any.  No trouble with urniary of fecal incontinence, no numbness, tingling, weakness.    I have reviewed patient's  PMH, FH, and Social history and Medications as related to this visit. Upon further questioning, states it is similar pain she has previously had with her hips.  Review of Systems    see HPI Objective:   Physical Exam GEN: Alert & Oriented, No acute distress, appears younger than her age. CV:  Regular Rate & Rhythm, no murmur Respiratory:  Normal work of breathing, CTAB Abdomen:  No painon palpation of abdomen or pelvis Back: no TTP of back or lateral hips, able to get onto exam table comfortably.  Neg straight leg raise.  Pain reproduces with external rotation of both hips, right > left.       Assessment & Plan:

## 2011-06-15 ENCOUNTER — Emergency Department (HOSPITAL_COMMUNITY): Payer: PRIVATE HEALTH INSURANCE

## 2011-06-15 ENCOUNTER — Encounter (HOSPITAL_COMMUNITY): Payer: Self-pay | Admitting: *Deleted

## 2011-06-15 ENCOUNTER — Inpatient Hospital Stay (HOSPITAL_COMMUNITY)
Admission: EM | Admit: 2011-06-15 | Discharge: 2011-06-22 | DRG: 853 | Disposition: A | Discharge status: Skilled Nursing Facility | Payer: PRIVATE HEALTH INSURANCE | Attending: Family Medicine | Admitting: Family Medicine

## 2011-06-15 DIAGNOSIS — Z1211 Encounter for screening for malignant neoplasm of colon: Secondary | ICD-10-CM

## 2011-06-15 DIAGNOSIS — Z9181 History of falling: Secondary | ICD-10-CM

## 2011-06-15 DIAGNOSIS — A4902 Methicillin resistant Staphylococcus aureus infection, unspecified site: Secondary | ICD-10-CM | POA: Diagnosis present

## 2011-06-15 DIAGNOSIS — F17200 Nicotine dependence, unspecified, uncomplicated: Secondary | ICD-10-CM

## 2011-06-15 DIAGNOSIS — D649 Anemia, unspecified: Secondary | ICD-10-CM | POA: Diagnosis present

## 2011-06-15 DIAGNOSIS — N739 Female pelvic inflammatory disease, unspecified: Secondary | ICD-10-CM | POA: Diagnosis present

## 2011-06-15 DIAGNOSIS — D539 Nutritional anemia, unspecified: Secondary | ICD-10-CM

## 2011-06-15 DIAGNOSIS — D510 Vitamin B12 deficiency anemia due to intrinsic factor deficiency: Secondary | ICD-10-CM

## 2011-06-15 DIAGNOSIS — R6520 Severe sepsis without septic shock: Secondary | ICD-10-CM

## 2011-06-15 DIAGNOSIS — M949 Disorder of cartilage, unspecified: Secondary | ICD-10-CM

## 2011-06-15 DIAGNOSIS — M25569 Pain in unspecified knee: Secondary | ICD-10-CM

## 2011-06-15 DIAGNOSIS — R5381 Other malaise: Secondary | ICD-10-CM | POA: Diagnosis present

## 2011-06-15 DIAGNOSIS — A419 Sepsis, unspecified organism: Principal | ICD-10-CM | POA: Diagnosis present

## 2011-06-15 DIAGNOSIS — Z87891 Personal history of nicotine dependence: Secondary | ICD-10-CM

## 2011-06-15 DIAGNOSIS — E876 Hypokalemia: Secondary | ICD-10-CM | POA: Diagnosis not present

## 2011-06-15 DIAGNOSIS — R000 Tachycardia, unspecified: Secondary | ICD-10-CM | POA: Diagnosis present

## 2011-06-15 DIAGNOSIS — Z22322 Carrier or suspected carrier of Methicillin resistant Staphylococcus aureus: Secondary | ICD-10-CM

## 2011-06-15 DIAGNOSIS — M899 Disorder of bone, unspecified: Secondary | ICD-10-CM

## 2011-06-15 DIAGNOSIS — N764 Abscess of vulva: Secondary | ICD-10-CM | POA: Diagnosis present

## 2011-06-15 DIAGNOSIS — E785 Hyperlipidemia, unspecified: Secondary | ICD-10-CM | POA: Diagnosis present

## 2011-06-15 DIAGNOSIS — N179 Acute kidney failure, unspecified: Secondary | ICD-10-CM | POA: Diagnosis present

## 2011-06-15 DIAGNOSIS — M169 Osteoarthritis of hip, unspecified: Secondary | ICD-10-CM

## 2011-06-15 DIAGNOSIS — K612 Anorectal abscess: Secondary | ICD-10-CM | POA: Diagnosis present

## 2011-06-15 DIAGNOSIS — G609 Hereditary and idiopathic neuropathy, unspecified: Secondary | ICD-10-CM

## 2011-06-15 DIAGNOSIS — E7800 Pure hypercholesterolemia, unspecified: Secondary | ICD-10-CM | POA: Diagnosis present

## 2011-06-15 HISTORY — DX: Pure hypercholesterolemia, unspecified: E78.00

## 2011-06-15 LAB — CBC
HCT: 41.5 % (ref 36.0–46.0)
Hemoglobin: 13.9 g/dL (ref 12.0–15.0)
MCH: 28.4 pg (ref 26.0–34.0)
MCHC: 33.5 g/dL (ref 30.0–36.0)
MCV: 84.9 fL (ref 78.0–100.0)
Platelets: 227 10*3/uL (ref 150–400)
RBC: 4.89 MIL/uL (ref 3.87–5.11)
RDW: 12.5 % (ref 11.5–15.5)
WBC: 14.1 10*3/uL — ABNORMAL HIGH (ref 4.0–10.5)

## 2011-06-15 LAB — DIFFERENTIAL
Basophils Absolute: 0 10*3/uL (ref 0.0–0.1)
Basophils Relative: 0 % (ref 0–1)
Eosinophils Absolute: 0 10*3/uL (ref 0.0–0.7)
Eosinophils Relative: 0 % (ref 0–5)
Lymphocytes Relative: 3 % — ABNORMAL LOW (ref 12–46)
Lymphs Abs: 0.4 10*3/uL — ABNORMAL LOW (ref 0.7–4.0)
Monocytes Absolute: 1.1 10*3/uL — ABNORMAL HIGH (ref 0.1–1.0)
Monocytes Relative: 8 % (ref 3–12)
Neutro Abs: 12.5 10*3/uL — ABNORMAL HIGH (ref 1.7–7.7)
Neutrophils Relative %: 89 % — ABNORMAL HIGH (ref 43–77)

## 2011-06-15 MED ORDER — SODIUM CHLORIDE 0.9 % IV SOLN
Freq: Once | INTRAVENOUS | Status: AC
  Administered 2011-06-15: 1000 mL/h via INTRAVENOUS

## 2011-06-15 MED ORDER — ACETAMINOPHEN 325 MG PO TABS
650.0000 mg | ORAL_TABLET | Freq: Once | ORAL | Status: AC
  Administered 2011-06-15: 650 mg via ORAL
  Filled 2011-06-15: qty 2

## 2011-06-15 NOTE — ED Notes (Signed)
Pt c/o stabbing back pain x 1 week. Denies difficulty voiding, or defecating. Last BM this morning.

## 2011-06-15 NOTE — ED Notes (Signed)
Patient with c/o lower back pain for about a week.  Patient denies any urinary symptoms

## 2011-06-15 NOTE — ED Provider Notes (Signed)
History     CSN: [id number]  Arrival date & time 06/15/11  2217   First MD Initiated Contact with Patient 06/15/11 2255      Chief Complaint  Patient presents with  . Back Pain    (Consider location/radiation/quality/duration/timing/severity/associated sxs/prior treatment) HPI Comments: One week history of pain in the lower back.  No injury or trauma.  Denies fever or urinary complaints at home.  Got worse tonight.    Patient is a 78 y.o. female presenting with back pain. The history is provided by the patient.  Back Pain  This is a new problem. Episode onset: earlier this week. The problem occurs constantly. The problem has been gradually worsening. The pain is associated with no known injury. The pain is present in the lumbar spine. The quality of the pain is described as stabbing. The pain does not radiate. The pain is moderate. The symptoms are aggravated by bending, twisting and certain positions. Pertinent negatives include no fever, no numbness, no abdominal swelling, no bowel incontinence, no dysuria, no tingling and no weakness. Treatments tried: tylenol. The treatment provided mild relief.    Past Medical History  Diagnosis Date  . Hypercholesteremia     History reviewed. No pertinent past surgical history.  History reviewed. No pertinent family history.  History  Substance Use Topics  . Smoking status: Current Some Day Smoker -- 0.3 packs/day    Types: Cigarettes  . Smokeless tobacco: Not on file  . Alcohol Use: No    OB History    Grav Para Term Preterm Abortions TAB SAB Ect Mult Living                  Review of Systems  Constitutional: Negative for fever.  Gastrointestinal: Negative for bowel incontinence.  Genitourinary: Negative for dysuria.  Musculoskeletal: Positive for back pain.  Neurological: Negative for tingling, weakness and numbness.  All other systems reviewed and are negative.    Allergies  Sulfonamide derivatives  Home Medications     Current Outpatient Rx  Name Route Sig Dispense Refill  . IBUPROFEN 200 MG PO TABS Oral Take 200 mg by mouth every 6 (six) hours as needed. For pain      BP 88/58  Pulse 131  Temp(Src) 100.3 F (37.9 C) (Oral)  Resp 24  SpO2 98%  Physical Exam  Nursing note and vitals reviewed. Constitutional: She is oriented to person, place, and time.       Patient is slightly diaphoretic.  She appears uncomfortable but is alert, appropriate.  HENT:  Head: Normocephalic and atraumatic.  Eyes: EOM are normal. Pupils are equal, round, and reactive to light.  Neck: Normal range of motion. Neck supple.  Cardiovascular:       Tachycardic with no murmurs.  Pulmonary/Chest: Effort normal and breath sounds normal.  Abdominal: Soft. Bowel sounds are normal. She exhibits no distension.       She is ttp in the llq and rlq quadrants without rebound or guarding.  Musculoskeletal: Normal range of motion. She exhibits no edema.  Neurological: She is alert and oriented to person, place, and time.  Skin: Skin is warm and dry.    ED Course  Procedures (including critical care time)   Labs Reviewed  URINALYSIS, ROUTINE W REFLEX MICROSCOPIC  CBC  DIFFERENTIAL  CULTURE, BLOOD (ROUTINE X 2)  CULTURE, BLOOD (ROUTINE X 2)  COMPREHENSIVE METABOLIC PANEL  URINE CULTURE  CARDIAC PANEL(CRET KIN+CKTOT+MB+TROPI)  LACTIC ACID, PLASMA  PROCALCITONIN   No results found.  No diagnosis found.    MDM  The patient arrived hypotensive, tachycardic and febrile.  As a result, a code sepsis was called.  A febrile workup was initiated, including blood cultures, urine culture and chest xray.  Also a ct of the abd/pelvis was performed which showed stranding in the in the lower abdomen, the significance of which I am unsure.  She was seen by critical care and will be admitted to their service.    CRITICAL CARE Performed by: Geoffery Lyons   Total critical care time: 45 minutes.  Critical care time was exclusive  of separately billable procedures and treating other patients.  Critical care was necessary to treat or prevent imminent or life-threatening deterioration.  Critical care was time spent personally by me on the following activities: development of treatment plan with patient and/or surrogate as well as nursing, discussions with consultants, evaluation of patient's response to treatment, examination of patient, obtaining history from patient or surrogate, ordering and performing treatments and interventions, ordering and review of laboratory studies, ordering and review of radiographic studies, pulse oximetry and re-evaluation of patient's condition.         Geoffery Lyons, MD 06/16/11 [phone]

## 2011-06-16 ENCOUNTER — Other Ambulatory Visit: Payer: Self-pay

## 2011-06-16 ENCOUNTER — Encounter (HOSPITAL_COMMUNITY): Payer: Self-pay | Admitting: Anesthesiology

## 2011-06-16 ENCOUNTER — Inpatient Hospital Stay (HOSPITAL_COMMUNITY): Payer: PRIVATE HEALTH INSURANCE | Admitting: Anesthesiology

## 2011-06-16 ENCOUNTER — Encounter (HOSPITAL_COMMUNITY)
Admission: EM | Disposition: A | Discharge status: Skilled Nursing Facility | Payer: Self-pay | Source: Home / Self Care | Attending: Internal Medicine

## 2011-06-16 ENCOUNTER — Encounter (HOSPITAL_COMMUNITY): Payer: Self-pay | Admitting: *Deleted

## 2011-06-16 DIAGNOSIS — L03319 Cellulitis of trunk, unspecified: Secondary | ICD-10-CM

## 2011-06-16 DIAGNOSIS — L02219 Cutaneous abscess of trunk, unspecified: Secondary | ICD-10-CM

## 2011-06-16 DIAGNOSIS — N739 Female pelvic inflammatory disease, unspecified: Secondary | ICD-10-CM | POA: Diagnosis present

## 2011-06-16 DIAGNOSIS — A419 Sepsis, unspecified organism: Secondary | ICD-10-CM | POA: Diagnosis present

## 2011-06-16 HISTORY — PX: INCISION AND DRAINAGE PERIRECTAL ABSCESS: SHX1804

## 2011-06-16 LAB — POCT I-STAT 3, ART BLOOD GAS (G3+)
Acid-base deficit: 8 mmol/L — ABNORMAL HIGH (ref 0.0–2.0)
Bicarbonate: 16.8 mEq/L — ABNORMAL LOW (ref 20.0–24.0)
O2 Saturation: 96 %
Patient temperature: 98.6
TCO2: 18 mmol/L (ref 0–100)
pCO2 arterial: 30.2 mmHg — ABNORMAL LOW (ref 35.0–45.0)
pH, Arterial: 7.353 (ref 7.350–7.400)
pO2, Arterial: 81 mmHg (ref 80.0–100.0)

## 2011-06-16 LAB — URINALYSIS, ROUTINE W REFLEX MICROSCOPIC
Glucose, UA: NEGATIVE mg/dL
Ketones, ur: 15 mg/dL — AB
Nitrite: POSITIVE — AB
Protein, ur: 100 mg/dL — AB
Specific Gravity, Urine: 1.027 (ref 1.005–1.030)
Urobilinogen, UA: 1 mg/dL (ref 0.0–1.0)
pH: 5 (ref 5.0–8.0)

## 2011-06-16 LAB — PHOSPHORUS: Phosphorus: 2.4 mg/dL (ref 2.3–4.6)

## 2011-06-16 LAB — URINE MICROSCOPIC-ADD ON

## 2011-06-16 LAB — CBC
HCT: 41.7 % (ref 36.0–46.0)
Hemoglobin: 14.3 g/dL (ref 12.0–15.0)
MCH: 29.3 pg (ref 26.0–34.0)
MCHC: 34.3 g/dL (ref 30.0–36.0)
MCV: 85.5 fL (ref 78.0–100.0)
Platelets: 186 10*3/uL (ref 150–400)
RBC: 4.88 MIL/uL (ref 3.87–5.11)
RDW: 12.6 % (ref 11.5–15.5)
WBC: 9.2 10*3/uL (ref 4.0–10.5)

## 2011-06-16 LAB — COMPREHENSIVE METABOLIC PANEL
ALT: 11 U/L (ref 0–35)
AST: 22 U/L (ref 0–37)
Albumin: 3.2 g/dL — ABNORMAL LOW (ref 3.5–5.2)
Alkaline Phosphatase: 82 U/L (ref 39–117)
BUN: 17 mg/dL (ref 6–23)
CO2: 16 mEq/L — ABNORMAL LOW (ref 19–32)
Calcium: 9.4 mg/dL (ref 8.4–10.5)
Chloride: 97 mEq/L (ref 96–112)
Creatinine, Ser: 1.3 mg/dL — ABNORMAL HIGH (ref 0.50–1.10)
GFR calc Af Amer: 43 mL/min — ABNORMAL LOW (ref >90)
GFR calc non Af Amer: 37 mL/min — ABNORMAL LOW (ref >90)
Glucose, Bld: 153 mg/dL — ABNORMAL HIGH (ref 70–99)
Potassium: 3.9 mEq/L (ref 3.5–5.1)
Sodium: 131 mEq/L — ABNORMAL LOW (ref 135–145)
Total Bilirubin: 1.3 mg/dL — ABNORMAL HIGH (ref 0.3–1.2)
Total Protein: 7.1 g/dL (ref 6.0–8.3)

## 2011-06-16 LAB — BASIC METABOLIC PANEL
BUN: 19 mg/dL (ref 6–23)
CO2: 20 mEq/L (ref 19–32)
Calcium: 9.2 mg/dL (ref 8.4–10.5)
Chloride: 98 mEq/L (ref 96–112)
Creatinine, Ser: 1.17 mg/dL — ABNORMAL HIGH (ref 0.50–1.10)
GFR calc Af Amer: 49 mL/min — ABNORMAL LOW (ref >90)
GFR calc non Af Amer: 43 mL/min — ABNORMAL LOW (ref >90)
Glucose, Bld: 146 mg/dL — ABNORMAL HIGH (ref 70–99)
Potassium: 4.1 mEq/L (ref 3.5–5.1)
Sodium: 133 mEq/L — ABNORMAL LOW (ref 135–145)

## 2011-06-16 LAB — PROCALCITONIN: Procalcitonin: 13.07 ng/mL

## 2011-06-16 LAB — MRSA PCR SCREENING: MRSA by PCR: POSITIVE — AB

## 2011-06-16 LAB — LACTIC ACID, PLASMA
Lactic Acid, Venous: 2 mmol/L (ref 0.5–2.2)
Lactic Acid, Venous: 2.4 mmol/L — ABNORMAL HIGH (ref 0.5–2.2)

## 2011-06-16 LAB — CARDIAC PANEL(CRET KIN+CKTOT+MB+TROPI)
CK, MB: 3.5 ng/mL (ref 0.3–4.0)
Relative Index: 0.8 (ref 0.0–2.5)
Total CK: 425 U/L — ABNORMAL HIGH (ref 7–177)
Troponin I: <0.3 ng/mL (ref <0.30)

## 2011-06-16 LAB — MAGNESIUM: Magnesium: 1.7 mg/dL (ref 1.5–2.5)

## 2011-06-16 SURGERY — INCISION AND DRAINAGE, ABSCESS, PERIRECTAL
Anesthesia: General | Site: Perineum | Wound class: Dirty or Infected

## 2011-06-16 MED ORDER — ONDANSETRON HCL 4 MG/2ML IJ SOLN
INTRAMUSCULAR | Status: DC | PRN
  Administered 2011-06-16: 4 mg via INTRAVENOUS

## 2011-06-16 MED ORDER — DOBUTAMINE IN D5W 4-5 MG/ML-% IV SOLN: 2.5000 ug/kg/min | INTRAVENOUS | Status: DC | PRN

## 2011-06-16 MED ORDER — SODIUM CHLORIDE 0.9 % IV SOLN
INTRAVENOUS | Status: DC
  Administered 2011-06-17 – 2011-06-19 (×3): via INTRAVENOUS

## 2011-06-16 MED ORDER — LACTATED RINGERS IV SOLN
INTRAVENOUS | Status: DC | PRN
  Administered 2011-06-16: 18:00:00 via INTRAVENOUS

## 2011-06-16 MED ORDER — HYDROMORPHONE HCL PF 1 MG/ML IJ SOLN
INTRAMUSCULAR | Status: AC
  Filled 2011-06-16: qty 1

## 2011-06-16 MED ORDER — HEPARIN SODIUM (PORCINE) 5000 UNIT/ML IJ SOLN
5000.0000 [IU] | Freq: Three times a day (TID) | INTRAMUSCULAR | Status: DC
  Administered 2011-06-16 (×2): 5000 [IU] via SUBCUTANEOUS
  Filled 2011-06-16 (×4): qty 1

## 2011-06-16 MED ORDER — NOREPINEPHRINE BITARTRATE 1 MG/ML IJ SOLN: 2.0000 ug/min | INTRAVENOUS | Status: DC | PRN

## 2011-06-16 MED ORDER — SODIUM CHLORIDE 0.9 % IV BOLUS (SEPSIS)
1000.0000 mL | Freq: Once | INTRAVENOUS | Status: AC
  Administered 2011-06-16: 1000 mL via INTRAVENOUS

## 2011-06-16 MED ORDER — MAGNESIUM SULFATE 40 MG/ML IJ SOLN
2.0000 g | Freq: Once | INTRAMUSCULAR | Status: DC
  Filled 2011-06-16: qty 50

## 2011-06-16 MED ORDER — ACETAMINOPHEN 325 MG PO TABS
650.0000 mg | ORAL_TABLET | Freq: Four times a day (QID) | ORAL | Status: DC | PRN
  Administered 2011-06-16 – 2011-06-18 (×2): 650 mg via ORAL
  Filled 2011-06-16 (×2): qty 2

## 2011-06-16 MED ORDER — CLINDAMYCIN PHOSPHATE 900 MG/50ML IV SOLN
900.0000 mg | Freq: Three times a day (TID) | INTRAVENOUS | Status: DC
  Administered 2011-06-16 – 2011-06-20 (×11): 900 mg via INTRAVENOUS
  Filled 2011-06-16 (×15): qty 50

## 2011-06-16 MED ORDER — PROPOFOL 10 MG/ML IV EMUL
INTRAVENOUS | Status: DC | PRN
  Administered 2011-06-16: 120 mg via INTRAVENOUS
  Administered 2011-06-16: 30 mg via INTRAVENOUS

## 2011-06-16 MED ORDER — SODIUM CHLORIDE 0.9 % IV BOLUS (SEPSIS)
500.0000 mL | Freq: Once | INTRAVENOUS | Status: AC
  Administered 2011-06-16: 500 mL via INTRAVENOUS

## 2011-06-16 MED ORDER — LIDOCAINE HCL (CARDIAC) 20 MG/ML IV SOLN
INTRAVENOUS | Status: DC | PRN
  Administered 2011-06-16: 100 mg via INTRAVENOUS

## 2011-06-16 MED ORDER — VANCOMYCIN HCL IN DEXTROSE 1-5 GM/200ML-% IV SOLN
1000.0000 mg | Freq: Once | INTRAVENOUS | Status: AC
  Administered 2011-06-16: 1000 mg via INTRAVENOUS
  Filled 2011-06-16: qty 200

## 2011-06-16 MED ORDER — SODIUM CHLORIDE 0.9 % IV BOLUS (SEPSIS)
25.0000 mL/kg | Freq: Once | INTRAVENOUS | Status: AC
  Administered 2011-06-16: 1625 mL via INTRAVENOUS

## 2011-06-16 MED ORDER — DROPERIDOL 2.5 MG/ML IJ SOLN
0.6250 mg | INTRAMUSCULAR | Status: DC | PRN
  Filled 2011-06-16: qty 0.25

## 2011-06-16 MED ORDER — DEXAMETHASONE SODIUM PHOSPHATE 4 MG/ML IJ SOLN
INTRAMUSCULAR | Status: DC | PRN
  Administered 2011-06-16: 4 mg via INTRAVENOUS

## 2011-06-16 MED ORDER — HYDROMORPHONE HCL PF 1 MG/ML IJ SOLN
0.2500 mg | INTRAMUSCULAR | Status: DC | PRN
  Administered 2011-06-16 – 2011-06-21 (×5): 0.5 mg via INTRAVENOUS
  Filled 2011-06-16 (×3): qty 1

## 2011-06-16 MED ORDER — PIPERACILLIN-TAZOBACTAM 3.375 G IVPB 30 MIN: 3.3750 g | Freq: Once | INTRAVENOUS | Status: DC

## 2011-06-16 MED ORDER — VANCOMYCIN HCL 1000 MG IV SOLR
750.0000 mg | Freq: Every day | INTRAVENOUS | Status: DC
  Administered 2011-06-16 – 2011-06-19 (×4): 750 mg via INTRAVENOUS
  Filled 2011-06-16 (×5): qty 750

## 2011-06-16 MED ORDER — MUPIROCIN 2 % EX OINT
1.0000 "application " | TOPICAL_OINTMENT | Freq: Two times a day (BID) | CUTANEOUS | Status: AC
  Administered 2011-06-16 – 2011-06-20 (×10): 1 via NASAL
  Filled 2011-06-16 (×4): qty 22

## 2011-06-16 MED ORDER — SODIUM CHLORIDE 0.45 % IV BOLUS
1000.0000 mL | Freq: Once | INTRAVENOUS | Status: AC
  Administered 2011-06-16: 1000 mL via INTRAVENOUS

## 2011-06-16 MED ORDER — PIPERACILLIN-TAZOBACTAM 3.375 G IVPB
3.3750 g | Freq: Once | INTRAVENOUS | Status: AC
  Administered 2011-06-16: 3.375 g via INTRAVENOUS
  Filled 2011-06-16: qty 50

## 2011-06-16 MED ORDER — HYDROCORTISONE SOD SUCCINATE 100 MG IJ SOLR
50.0000 mg | Freq: Four times a day (QID) | INTRAMUSCULAR | Status: DC
  Administered 2011-06-16 – 2011-06-19 (×10): 50 mg via INTRAVENOUS
  Filled 2011-06-16 (×15): qty 1

## 2011-06-16 MED ORDER — VANCOMYCIN HCL IN DEXTROSE 1-5 GM/200ML-% IV SOLN: 1000.0000 mg | Freq: Once | INTRAVENOUS | Status: DC

## 2011-06-16 MED ORDER — FENTANYL CITRATE 0.05 MG/ML IJ SOLN
INTRAMUSCULAR | Status: DC | PRN
  Administered 2011-06-16: 50 ug via INTRAVENOUS
  Administered 2011-06-16: 100 ug via INTRAVENOUS

## 2011-06-16 MED ORDER — DROPERIDOL 2.5 MG/ML IJ SOLN
INTRAMUSCULAR | Status: DC | PRN
  Administered 2011-06-16: 0.625 mg via INTRAVENOUS

## 2011-06-16 MED ORDER — CLINDAMYCIN PHOSPHATE 600 MG/50ML IV SOLN
600.0000 mg | Freq: Four times a day (QID) | INTRAVENOUS | Status: DC
  Filled 2011-06-16 (×3): qty 50

## 2011-06-16 MED ORDER — PANTOPRAZOLE SODIUM 20 MG PO TBEC
20.0000 mg | DELAYED_RELEASE_TABLET | Freq: Every day | ORAL | Status: DC
  Administered 2011-06-16 – 2011-06-22 (×7): 20 mg via ORAL
  Filled 2011-06-16 (×7): qty 1

## 2011-06-16 MED ORDER — SODIUM CHLORIDE 0.9 % IV BOLUS (SEPSIS)
1000.0000 mL | Freq: Once | INTRAVENOUS | Status: AC
  Administered 2011-06-17: 1000 mL via INTRAVENOUS

## 2011-06-16 MED ORDER — 0.9 % SODIUM CHLORIDE (POUR BTL) OPTIME
TOPICAL | Status: DC | PRN
  Administered 2011-06-16: 1000 mL

## 2011-06-16 MED ORDER — MAGNESIUM SULFATE 50 % IJ SOLN: 2.0000 g | Freq: Once | INTRAVENOUS | Status: DC

## 2011-06-16 MED ORDER — SODIUM CHLORIDE 0.9 % IV BOLUS (SEPSIS): 500.0000 mL | INTRAVENOUS | Status: DC | PRN

## 2011-06-16 MED ORDER — PIPERACILLIN-TAZOBACTAM 3.375 G IVPB
3.3750 g | Freq: Three times a day (TID) | INTRAVENOUS | Status: DC
  Administered 2011-06-16 – 2011-06-19 (×10): 3.375 g via INTRAVENOUS
  Filled 2011-06-16 (×12): qty 50

## 2011-06-16 MED ORDER — SODIUM CHLORIDE 0.9 % IR SOLN
Status: DC | PRN
  Administered 2011-06-16: 3000 mL

## 2011-06-16 MED ORDER — CHLORHEXIDINE GLUCONATE CLOTH 2 % EX PADS
6.0000 | MEDICATED_PAD | Freq: Every day | CUTANEOUS | Status: AC
  Administered 2011-06-16 – 2011-06-19 (×4): 6 via TOPICAL

## 2011-06-16 MED ORDER — SODIUM CHLORIDE 0.9 % IV SOLN
INTRAVENOUS | Status: DC
Start: 2011-06-16 — End: 2011-06-19
  Administered 2011-06-16: 06:00:00 via INTRAVENOUS

## 2011-06-16 SURGICAL SUPPLY — 32 items
BANDAGE GAUZE ELAST BULKY 4 IN (GAUZE/BANDAGES/DRESSINGS) ×2 IMPLANT
BLADE SURG 15 STRL LF DISP TIS (BLADE) ×1 IMPLANT
BLADE SURG 15 STRL SS (BLADE) ×1
CANISTER SUCTION 2500CC (MISCELLANEOUS) ×2 IMPLANT
CLEANER TIP ELECTROSURG 2X2 (MISCELLANEOUS) IMPLANT
CLOTH BEACON ORANGE TIMEOUT ST (SAFETY) ×2 IMPLANT
COVER SURGICAL LIGHT HANDLE (MISCELLANEOUS) ×2 IMPLANT
DRAPE UTILITY 15X26 W/TAPE STR (DRAPE) ×4 IMPLANT
DRSG PAD ABDOMINAL 8X10 ST (GAUZE/BANDAGES/DRESSINGS) ×4 IMPLANT
ELECT REM PT RETURN 9FT ADLT (ELECTROSURGICAL)
ELECTRODE REM PT RTRN 9FT ADLT (ELECTROSURGICAL) IMPLANT
GAUZE PACKING IODOFORM 1 (PACKING) IMPLANT
GAUZE SPONGE 4X4 16PLY XRAY LF (GAUZE/BANDAGES/DRESSINGS) ×2 IMPLANT
GLOVE BIO SURGEON STRL SZ7.5 (GLOVE) ×2 IMPLANT
GOWN STRL NON-REIN LRG LVL3 (GOWN DISPOSABLE) ×4 IMPLANT
HANDPIECE INTERPULSE COAX TIP (DISPOSABLE) ×1
KIT BASIN OR (CUSTOM PROCEDURE TRAY) ×2 IMPLANT
KIT ROOM TURNOVER OR (KITS) ×2 IMPLANT
NS IRRIG 1000ML POUR BTL (IV SOLUTION) ×2 IMPLANT
PACK LITHOTOMY IV (CUSTOM PROCEDURE TRAY) ×2 IMPLANT
PAD ARMBOARD 7.5X6 YLW CONV (MISCELLANEOUS) ×4 IMPLANT
PENCIL BUTTON HOLSTER BLD 10FT (ELECTRODE) IMPLANT
SET HNDPC FAN SPRY TIP SCT (DISPOSABLE) ×1 IMPLANT
SPONGE GAUZE 4X4 12PLY (GAUZE/BANDAGES/DRESSINGS) ×2 IMPLANT
SWAB COLLECTION DEVICE MRSA (MISCELLANEOUS) ×2 IMPLANT
TOWEL OR 17X24 6PK STRL BLUE (TOWEL DISPOSABLE) ×2 IMPLANT
TOWEL OR 17X26 10 PK STRL BLUE (TOWEL DISPOSABLE) ×2 IMPLANT
TUBE ANAEROBIC SPECIMEN COL (MISCELLANEOUS) ×2 IMPLANT
TUBE CONNECTING 12X1/4 (SUCTIONS) ×2 IMPLANT
UNDERPAD 30X30 INCONTINENT (UNDERPADS AND DIAPERS) ×2 IMPLANT
WATER STERILE IRR 1000ML POUR (IV SOLUTION) ×2 IMPLANT
YANKAUER SUCT BULB TIP NO VENT (SUCTIONS) ×2 IMPLANT

## 2011-06-16 NOTE — Op Note (Signed)
06/15/2011 - 06/16/2011  7:08 PM  PATIENT:  Joann [name]  78 y.o. female  PRE-OPERATIVE DIAGNOSIS:  Labial Abscess  POST-OPERATIVE DIAGNOSIS:  Right groin, perirectal abscess  PROCEDURE:  Procedure(s) (LRB): IRRIGATION AND DEBRIDEMENT PERIRECTAL ABSCESS (N/A)  SURGEON:  Surgeon(s) and Role:    * Robyne Askew, MD - Primary  PHYSICIAN ASSISTANT:   ASSISTANTS: none   ANESTHESIA:   general  EBL:     BLOOD ADMINISTERED:none  DRAINS: none   LOCAL MEDICATIONS USED:  NONE  SPECIMEN:  No Specimen  DISPOSITION OF SPECIMEN:  N/A  COUNTS:  YES  TOURNIQUET:  * No tourniquets in log *  DICTATION: .Dragon Dictation After informed consent was obtained patient was brought to the operating room placed in the supine position on the operating room table. After adequate induction of general anesthesia the patient was moved in the lithotomy position and all pressure points were padded. Her pararectal and perineal areas were prepped with Betadine and draped in usual sterile manner. The patient had a large red indurated fluctuant area encompassing the right perirectal and perineal space. This was opened sharply with the electrocautery. A large amount of purulent material was evacuated. Cultures were taken. The cavity was probed with blunt finger dissection and the cavity was opened completely until the tissue planes would not separate anymore. Hemostasis was achieved using the Bovie electrocautery. The wound was then irrigated with copious amounts of saline with a pulse lavage. The wound was then packed with Kerlix gauze and sterile dressings were applied. The patient tolerated the procedure well. At the end of the case all needle sponge and instrument counts were correct. The patient was then awakened and taken to recovery in stable condition.  PLAN OF CARE: Admit to inpatient   PATIENT DISPOSITION:  PACU - guarded condition.   Delay start of Pharmacological VTE agent (>24hrs) due to surgical  blood loss or risk of bleeding: yes

## 2011-06-16 NOTE — Progress Notes (Signed)
ANTIBIOTIC CONSULT NOTE - FOLLOW UP  Pharmacy Consult for Clindamycin Indication: rule out sepsis, Toxic shock  Allergies  Allergen Reactions  . Sulfonamide Derivatives     REACTION: unspecified    Patient Measurements: Height: 5\' 7"  (170.2 cm) Weight: 137 lb 5.6 oz (62.3 kg) IBW/kg (Calculated) : 61.6    Vital Signs: Temp: 103 F (39.4 C) (05/19 1200) Temp src: Oral (05/19 1200) BP: 83/52 mmHg (05/19 1633) Pulse Rate: 112  (05/19 1633)  Labs:  Basename 06/16/11 0612 06/15/11 2316  WBC 9.2 14.1*  HGB 14.3 13.9  PLT 186 227  LABCREA -- --  CREATININE 1.17* 1.30*   Estimated Creatinine Clearance: 36.7 ml/min (by C-G formula based on Cr of 1.17).  Assessment:lower back pain, pelvic pain, fever and general malaise, fever, hypotension. 78 yo female with sepsis for empiric antibiotics: Vanco/Zosyn. CT 5/19: Stranding which may reflect an infectious process originating from the rectum or vagina. Add Clinda for toxin inhibition.    Plan:  Clindamycin 900mg  IV q8h. Misty Stanley Stillinger 06/16/2011,5:02 PM

## 2011-06-16 NOTE — Progress Notes (Addendum)
Name: Joann [name] MRN: [id number] DOB: 12-16-1927    LOS: 1  PULMONARY / CRITICAL CARE MEDICINE  HPI: 78 year old female with no significant PMH except for heavy smoking of 60 packs year and dyslipidemia.  Presented on 5/19 with one to two days history of lower back pain, pelvic pain, fever and general malaise. At admission to the ER was found to be febrile and hypotensive. Her BP did not improve with one liter of NS. PCCM asked to admit.   Current Status: Awake, alert, oriented. Feeling better.  Vital Signs: Temp:  [98 F (36.7 C)-103 F (39.4 C)] 103 F (39.4 C) (05/19 0800) Pulse Rate:  [30-154] 128  (05/19 0700) Resp:  [16-40] 38  (05/19 0700) BP: (81-114)/(42-71) 110/56 mmHg (05/19 0700) SpO2:  [85 %-100 %] 98 % (05/19 0700) Weight:  [62.3 kg (137 lb 5.6 oz)-65 kg (143 lb 4.8 oz)] 62.3 kg (137 lb 5.6 oz) (05/19 0500)    . sodium chloride 100 mL/hr at 06/16/11 0551    Physical Examination: General:  Pleasant female patient not in acute distress.  Neuro:  Awake, alert, oriented x 3. Non focal. HEENT:  PERRL, pink conjunctivae, dry mucous membranes Neck:  Supple, no JVD   Cardiovascular:  RRR, no M/R/G Lungs:  CTA Abdomen:  Soft, mildly tender on the lower abdomen, nondistended, bowel sounds present. Mild CVA tenderness on the left. Gen:_ indurated rt labial pelvis,tender,diffuse, c/w abscess Musculoskeletal:  Moves all extremities, no pedal edema Skin:  No rash  Active problems: Sepsis  ASSESSMENT AND PLAN  PULMONARY No results found for this basename: PHART:5,PCO2:5,PCO2ART:5,PO2ART:5,HCO3:5,O2SAT:5 in the last 168 hours CXR:  No acute infiltrates on X ray  A:Tachypnea P:  -pulse ox while in ICU No infiltrates, pcxr reviewed  CARDIOVASCULAR  Lab 06/15/11 2337 06/15/11 2318  TROPONINI -- <0.30  LATICACIDVEN 2.0 --  PROBNP -- --   ECG:  Pending Lines: Peripherals.  A: Tachycardia, SIRS/Sepsis. Not in shock at time of assessmnet Earlier in day and  admission = Septic shock criteria met P:  -cont IVFs -recheck lactate trend -see ID section  RENAL  Lab 06/16/11 0612 06/15/11 2316  NA 133* 131*  K 4.1 3.9  CL 98 97  CO2 20 16*  BUN 19 17  CREATININE 1.17* 1.30*  CALCIUM 9.2 9.4  MG 1.7 --  PHOS 2.4 --   Intake/Output      05/18 0701 - 05/19 0700 05/19 0701 - 05/20 0700   I.V. (mL/kg) 1615 (25.9) 100 (1.6)   IV Piggyback 250 50   Total Intake(mL/kg) 1865 (29.9) 150 (2.4)   Urine (mL/kg/hr) 75 (0.1)    Total Output 75    Net +1790 +150         CT abd/pelvis 5/19: Symmetric renal size. Images through the lower right kidney are degraded by respiratory motion. Subcentimeter hypodensity within  the lower pole of the left kidney is favored to represent a cyst. No hydronephrosis or hydroureter  A:  -Acute renal failure, likely pre renal. Improved w/ IVFs.  P:   1) Continue IVF resuscitation 2) Will follow CMP in am replace mag  GASTROINTESTINAL  Lab 06/15/11 2316  AST 22  ALT 11  ALKPHOS 82  BILITOT 1.3*  PROT 7.1  ALBUMIN 3.2*  CT abd/pelvis 5/19: Stranding of the right mesorectal, perivaginal, and ischioanal fat, with extension inferiorly to involve the right perineum and  posterior subcutaneous fat. Correlate with direct inspection. May  reflect an infectious process originating from the rectum  or  vagina. No associated loculated fluid collection appreciated. A: Source unclear to me urine P:   1) Regular diet, NPO may need OR 2) PO protonix for GI prophylaxis See ID  HEMATOLOGIC  Lab 06/16/11 0612 06/15/11 2316  HGB 14.3 13.9  HCT 41.7 41.5  PLT 186 227  INR -- --  APTT -- --   A:  No evidence of bleeding P: -monitor  -xfuse for hgb <7 or shock   INFECTIOUS  Lab 06/16/11 0612 06/15/11 2319 06/15/11 2316  WBC 9.2 -- 14.1*  PROCALCITON -- 13.07 --   Cultures: Will follow UA, urine, blood cultures Antibiotics: Zosyn and vancomycin A: -UA unimpressive, I examined labia, pelvis c/w  abscess ( this is the source) P:   1) Zosyn and vanc 2) continued fevers,add clinda toxin inh until r/o soft tissue Immediate surgery consult Repeat lactic acid  ENDOCRINE No results found for this basename: GLUCAP:5 in the last 168 hours A:   -No history of DM  - cortisol then stress steroid snow  NEUROLOGIC  A:  Pain control, r/o septic enceph P: monitor abg  BEST PRACTICE / DISPOSITION - Level of Care:  ICU - Primary Service:  PCCM - Consultants:  None - Code Status:  Full - Diet:  Regular - DVT Px:  Heparin - GI Px:  Protonix - Skin Integrity:  Intact. - Social / Family:  Family informed at bedside.  Has abscess , calling surgery, clinda  Ccm 30 min   06/16/2011, 8:44 AM   Mcarthur Rossetti. Tyson Alias, MD, FACP Pgr: [phone] [facility]

## 2011-06-16 NOTE — Progress Notes (Signed)
ANTIBIOTIC CONSULT NOTE - FOLLOW UP  Pharmacy Consult for Vancomycin and Zosyn  Indication: rule out sepsis  Allergies  Allergen Reactions  . Sulfonamide Derivatives     REACTION: unspecified    Patient Measurements: Height: 5\' 7"  (170.2 cm) Weight: 143 lb 4.8 oz (65 kg) IBW/kg (Calculated) : 61.6    Vital Signs: Temp: 98 F (36.7 C) (05/19 0108) Temp src: Oral (05/19 0108) BP: 86/61 mmHg (05/19 0100) Pulse Rate: 96  (05/19 0108)  Labs:  Basename 06/15/11 2316  WBC 14.1*  HGB 13.9  PLT 227  LABCREA --  CREATININE 1.30*   Estimated Creatinine Clearance: 33 ml/min (by C-G formula based on Cr of 1.3).  Assessment: 78 yo female with sepsis for empiric antibiotics.  Zosyn and Vancomycin 1 g IV given in ED at 1 am  Goal of Therapy:  Vancomycin trough level 15-20 mcg/ml  Plan:  Vancomycin 750 mg IV q24h Zosyn 3.375 g IV q8h  Clothilde Tippetts, Gary Fleet 06/16/2011,1:28 AM

## 2011-06-16 NOTE — H&P (Signed)
Name: Joann [name] MRN: [id number] DOB: 01-16-1928    LOS: 1  PULMONARY / CRITICAL CARE MEDICINE  HPI: 78 year old female with no significant PMH except for heavy smoking of 60 packs year and dyslipidemia. She has history of a previous UTI about a year ago. Presents with one to two days history of lower back pain, pelvic pain, fever and general malaise. Denies chest pain, SOB, wheezing, cough, sputum production, nausea, vomiting, diarrhea or urinary symptoms. Also denies headaches. At admission to the ER was found to be febrile and hypotensive. Her BP did not improve with one liter of NS. At the time of my examination the patient is awake, alert, oriented. MAP is 63 and the patient is getting her second litter of fluid.  Past Medical History  Diagnosis Date  . Hypercholesteremia    History reviewed. No pertinent past surgical history. Prior to Admission medications   Medication Sig Start Date End Date Taking? Authorizing Provider  ibuprofen (ADVIL,MOTRIN) 200 MG tablet Take 200 mg by mouth every 6 (six) hours as needed. For pain   Yes Historical Provider, MD   Allergies Allergies  Allergen Reactions  . Sulfonamide Derivatives     REACTION: unspecified    Family History History reviewed. No pertinent family history. Social History  reports that she has been smoking Cigarettes.  She has been smoking about .3 packs per day. She does not have any smokeless tobacco history on file. She reports that she does not drink alcohol. Her drug history not on file.  Review Of Systems:  Negative except for what I mentioned in the HPI.   Current Status: Awake, alert, oriented. Feeling better.  Vital Signs: Temp:  [98 F (36.7 C)-101.5 F (38.6 C)] 98 F (36.7 C) (05/19 0108) Pulse Rate:  [96-154] 96  (05/19 0108) Resp:  [16-40] 20  (05/19 0108) BP: (81-114)/(46-71) 86/61 mmHg (05/19 0100) SpO2:  [96 %-100 %] 99 % (05/19 0108) Weight:  [143 lb 4.8 oz (65 kg)] 143 lb 4.8 oz (65 kg) (05/19  0108)  Physical Examination: General:  Pleasant female patient not in acute distress.  Neuro:  Awake, alert, oriented x 3. Non focal. HEENT:  PERRL, pink conjunctivae, dry mucous membranes Neck:  Supple, no JVD   Cardiovascular:  RRR, no M/R/G Lungs:  CTA Abdomen:  Soft, mildly tender on the lower abdomen, nondistended, bowel sounds present. Mild CVA tenderness on the left. Musculoskeletal:  Moves all extremities, no pedal edema Skin:  No rash  Active problems: Sepsis  ASSESSMENT AND PLAN  PULMONARY No results found for this basename: PHART:5,PCO2:5,PCO2ART:5,PO2ART:5,HCO3:5,O2SAT:5 in the last 168 hours CXR:  No acute infiltrates on X ray  A:   -Stable on RA - History of heavy smoking. No diagnosis of COPD   CARDIOVASCULAR  Lab 06/15/11 2337 06/15/11 2318  TROPONINI -- <0.30  LATICACIDVEN 2.0 --  PROBNP -- --   ECG:  Pending Lines: Peripherals.  A: -Stable, no chest pain. - Hypotensive likely secondary to sepsis P:  Will follow EKG  RENAL  Lab 06/15/11 2316  NA 131*  K 3.9  CL 97  CO2 16*  BUN 17  CREATININE 1.30*  CALCIUM 9.4  MG --  PHOS --   Intake/Output    None     A:  -Acute renal failure, likely pre renal. P:   1) Continue IVF resuscitation 2) Will follow CMP in am  GASTROINTESTINAL  Lab 06/15/11 2316  AST 22  ALT 11  ALKPHOS 82  BILITOT 1.3*  PROT 7.1  ALBUMIN 3.2*    A: Stable P:   1) Regular diet. 2) PO protonix for GI prophylaxis   HEMATOLOGIC  Lab 06/15/11 2316  HGB 13.9  HCT 41.5  PLT 227  INR --  APTT --   A:  Elevated WBC secondary to sepsis   INFECTIOUS  Lab 06/15/11 2319 06/15/11 2316  WBC -- 14.1*  PROCALCITON 13.07 --   Cultures: Will follow UA, urine, blood cultures Antibiotics: Zosyn and vancomycin A: -Sepsis likely secondary to UTI. Still the possibility of intra abdominal process given CT findings but less likely P:   1) Zosyn and vanc 2) Will narrow antibiotics with culture  results. 3) Early goal directed therapy per protocol (goal MAP of 65) 4)Folow CBC, CMP, CA, phos and Mag in am,   ENDOCRINE No results found for this basename: GLUCAP:5 in the last 168 hours A:   -No history of DM  - No need for stress dose steroids   NEUROLOGIC  A:  Stable   BEST PRACTICE / DISPOSITION - Level of Care:  ICU - Primary Service:  PCCM - Consultants:  None - Code Status:  Full - Diet:  Regular - DVT Px:  Heparin - GI Px:  Protonix - Skin Integrity:  Intact. - Social / Family:  Family informed at bedside.  The patient is critically ill with multiple organ systems failure and requires high complexity decision making for assessment and support, frequent evaluation and titration of therapies, application of advanced monitoring technologies and extensive interpretation of multiple databases.   Critical Care Time devoted to patient care services described in this note is: 1 Hour 30 Minutes  Overton Mam, M.D. Pulmonary and Critical Care Medicine [facility] Pager: [phone]  06/16/2011, 1:29 AM

## 2011-06-16 NOTE — Anesthesia Procedure Notes (Signed)
Procedure Name: LMA Insertion Date/Time: 06/16/2011 6:32 PM Performed by: Wray Kearns A Pre-anesthesia Checklist: Patient identified, Timeout performed, Emergency Drugs available, Suction available and Patient being monitored Patient Re-evaluated:Patient Re-evaluated prior to inductionOxygen Delivery Method: Circle system utilized Preoxygenation: Pre-oxygenation with 100% oxygen Intubation Type: IV induction Ventilation: Mask ventilation without difficulty LMA: LMA inserted LMA Size: 4.0 Tube type: Oral Tube size: 4.0 mm Number of attempts: 1 Placement Confirmation: positive ETCO2 and breath sounds checked- equal and bilateral Secured at: 27 cm Tube secured with: Tape Dental Injury: Teeth and Oropharynx as per pre-operative assessment

## 2011-06-16 NOTE — Preoperative (Signed)
Beta Blockers   Reason not to administer Beta Blockers:Not Applicable 

## 2011-06-16 NOTE — Consult Note (Signed)
Reason for Consult:fever Referring Physician: Dr. Karrie Meres is an 78 y.o. female.  HPI: 78 yo bf presents with 2 day history of fever and malaise. She does report pain in her right labial area. CT showed significant inflammation of this area. She is in ICU with somewhat low BP getting resuscitated by CCM  Past Medical History  Diagnosis Date  . Hypercholesteremia     History reviewed. No pertinent past surgical history.  History reviewed. No pertinent family history.  Social History:  reports that she has been smoking Cigarettes.  She has been smoking about .3 packs per day. She does not have any smokeless tobacco history on file. She reports that she does not drink alcohol. Her drug history not on file.  Allergies:  Allergies  Allergen Reactions  . Sulfonamide Derivatives     REACTION: unspecified    Medications: I have reviewed the patient's current medications.  Results for orders placed during the hospital encounter of 06/15/11 (from the past 48 hour(s))  CBC     Status: Abnormal   Collection Time   06/15/11 11:16 PM      Component Value Range Comment   WBC 14.1 (*) 4.0 - 10.5 (K/uL)    RBC 4.89  3.87 - 5.11 (MIL/uL)    Hemoglobin 13.9  12.0 - 15.0 (g/dL)    HCT 16.1  09.6 - 04.5 (%)    MCV 84.9  78.0 - 100.0 (fL)    MCH 28.4  26.0 - 34.0 (pg)    MCHC 33.5  30.0 - 36.0 (g/dL)    RDW 40.9  81.1 - 91.4 (%)    Platelets 227  150 - 400 (K/uL)   DIFFERENTIAL     Status: Abnormal   Collection Time   06/15/11 11:16 PM      Component Value Range Comment   Neutrophils Relative 89 (*) 43 - 77 (%)    Neutro Abs 12.5 (*) 1.7 - 7.7 (K/uL)    Lymphocytes Relative 3 (*) 12 - 46 (%)    Lymphs Abs 0.4 (*) 0.7 - 4.0 (K/uL)    Monocytes Relative 8  3 - 12 (%)    Monocytes Absolute 1.1 (*) 0.1 - 1.0 (K/uL)    Eosinophils Relative 0  0 - 5 (%)    Eosinophils Absolute 0.0  0.0 - 0.7 (K/uL)    Basophils Relative 0  0 - 1 (%)    Basophils Absolute 0.0  0.0 - 0.1 (K/uL)     COMPREHENSIVE METABOLIC PANEL     Status: Abnormal   Collection Time   06/15/11 11:16 PM      Component Value Range Comment   Sodium 131 (*) 135 - 145 (mEq/L)    Potassium 3.9  3.5 - 5.1 (mEq/L)    Chloride 97  96 - 112 (mEq/L)    CO2 16 (*) 19 - 32 (mEq/L)    Glucose, Bld 153 (*) 70 - 99 (mg/dL)    BUN 17  6 - 23 (mg/dL)    Creatinine, Ser 7.82 (*) 0.50 - 1.10 (mg/dL)    Calcium 9.4  8.4 - 10.5 (mg/dL)    Total Protein 7.1  6.0 - 8.3 (g/dL)    Albumin 3.2 (*) 3.5 - 5.2 (g/dL)    AST 22  0 - 37 (U/L)    ALT 11  0 - 35 (U/L)    Alkaline Phosphatase 82  39 - 117 (U/L)    Total Bilirubin 1.3 (*) 0.3 - 1.2 (mg/dL)  GFR calc non Af Amer 37 (*) >90 (mL/min)    GFR calc Af Amer 43 (*) >90 (mL/min)   CARDIAC PANEL(CRET KIN+CKTOT+MB+TROPI)     Status: Abnormal   Collection Time   06/15/11 11:18 PM      Component Value Range Comment   Total CK 425 (*) 7 - 177 (U/L)    CK, MB 3.5  0.3 - 4.0 (ng/mL)    Troponin I <0.30  <0.30 (ng/mL)    Relative Index 0.8  0.0 - 2.5    PROCALCITONIN     Status: Normal   Collection Time   06/15/11 11:19 PM      Component Value Range Comment   Procalcitonin 13.07     LACTIC ACID, PLASMA     Status: Normal   Collection Time   06/15/11 11:37 PM      Component Value Range Comment   Lactic Acid, Venous 2.0  0.5 - 2.2 (mmol/L)   MRSA PCR SCREENING     Status: Abnormal   Collection Time   06/16/11  2:12 AM      Component Value Range Comment   MRSA by PCR POSITIVE (*) NEGATIVE    URINALYSIS, ROUTINE W REFLEX MICROSCOPIC     Status: Abnormal   Collection Time   06/16/11  5:38 AM      Component Value Range Comment   Color, Urine ORANGE (*) YELLOW  BIOCHEMICALS MAY BE AFFECTED BY COLOR   APPearance TURBID (*) CLEAR     Specific Gravity, Urine 1.027  1.005 - 1.030     pH 5.0  5.0 - 8.0     Glucose, UA NEGATIVE  NEGATIVE (mg/dL)    Hgb urine dipstick SMALL (*) NEGATIVE     Bilirubin Urine SMALL (*) NEGATIVE     Ketones, ur 15 (*) NEGATIVE (mg/dL)     Protein, ur 098 (*) NEGATIVE (mg/dL)    Urobilinogen, UA 1.0  0.0 - 1.0 (mg/dL)    Nitrite POSITIVE (*) NEGATIVE     Leukocytes, UA SMALL (*) NEGATIVE    URINE MICROSCOPIC-ADD ON     Status: Abnormal   Collection Time   06/16/11  5:38 AM      Component Value Range Comment   Squamous Epithelial / LPF FEW (*) RARE     WBC, UA 3-6  <3 (WBC/hpf)    RBC / HPF 0-2  <3 (RBC/hpf)    Bacteria, UA MANY (*) RARE     Casts GRANULAR CAST (*) NEGATIVE  HYALINE CASTS  CBC     Status: Normal   Collection Time   06/16/11  6:12 AM      Component Value Range Comment   WBC 9.2  4.0 - 10.5 (K/uL)    RBC 4.88  3.87 - 5.11 (MIL/uL)    Hemoglobin 14.3  12.0 - 15.0 (g/dL)    HCT 11.9  14.7 - 82.9 (%)    MCV 85.5  78.0 - 100.0 (fL)    MCH 29.3  26.0 - 34.0 (pg)    MCHC 34.3  30.0 - 36.0 (g/dL)    RDW 56.2  13.0 - 86.5 (%)    Platelets 186  150 - 400 (K/uL)   BASIC METABOLIC PANEL     Status: Abnormal   Collection Time   06/16/11  6:12 AM      Component Value Range Comment   Sodium 133 (*) 135 - 145 (mEq/L)    Potassium 4.1  3.5 - 5.1 (mEq/L)    Chloride  98  96 - 112 (mEq/L)    CO2 20  19 - 32 (mEq/L)    Glucose, Bld 146 (*) 70 - 99 (mg/dL)    BUN 19  6 - 23 (mg/dL)    Creatinine, Ser 0.98 (*) 0.50 - 1.10 (mg/dL)    Calcium 9.2  8.4 - 10.5 (mg/dL)    GFR calc non Af Amer 43 (*) >90 (mL/min)    GFR calc Af Amer 49 (*) >90 (mL/min)   PHOSPHORUS     Status: Normal   Collection Time   06/16/11  6:12 AM      Component Value Range Comment   Phosphorus 2.4  2.3 - 4.6 (mg/dL)   MAGNESIUM     Status: Normal   Collection Time   06/16/11  6:12 AM      Component Value Range Comment   Magnesium 1.7  1.5 - 2.5 (mg/dL)     Ct Abdomen Pelvis Wo Contrast  06/16/2011  *RADIOLOGY REPORT*  Clinical Data: Fever, hypotension, abdominal and back pain.  CT ABDOMEN AND PELVIS WITHOUT CONTRAST  Technique:  Multidetector CT imaging of the abdomen and pelvis was performed following the standard protocol without intravenous  contrast.  Comparison: 04/10/2005  Findings: Coronary artery calcification.  Heart size upper normal limits.  No pleural or pericardial effusion.  Calcified right lower lobe nodule.  Mild dependent scarring versus atelectasis.  2 mm subpleural nodules within the left lower lobe.  Given the calcified nodule within the right lower lobe, likely secondary to the same process.  Intra-abdominal organ evaluation is limited in the absence of intravenous contrast.  Within this limitation, unremarkable liver, spleen, pancreas, adrenal glands.  Cholelithiasis.  No biliary ductal dilatation.  Symmetric renal size.  Images through the lower right kidney are degraded by respiratory motion.  Subcentimeter hypodensity within the lower pole of the left kidney is favored to represent a cyst. No hydronephrosis or hydroureter.  No bowel obstruction.  No CT evidence for colitis.  Colonic diverticulosis.  Appendix not identified however no right lower quadrant inflammation.  Thin-walled bladder.  Absent uterus. Air within the vagina is nonspecific.  No adnexal mass.  There is a significant amount of stranding within the right mesorectal fat, with mild extension into the right ischioanal fat.  On the lower images, significant stranding within the right perineum and subcutaneous fat along the right gluteal cleft.  Osteopenia. Multilevel degenerative changes of the imaged spine. No acute or aggressive appearing osseous lesion.  Mild vertebral body height loss at L4 is age indeterminate however has progressed from 2007.  IMPRESSION: Stranding of the right mesorectal, perivaginal, and ischioanal fat, with extension inferiorly to involve the right perineum and posterior subcutaneous fat. Correlate with direct inspection.  May reflect an infectious process originating from the rectum or vagina. No associated loculated fluid collection appreciated.  An underlying mass cannot be excluded by this noncontrast examination.  Cholelithiasis.  Mild  compression fracture of the L4 vertebral body is age indeterminate.  Correlate with point tenderness.  Original Report Authenticated By: Waneta Martins, M.D.   Dg Chest Port 1 View  06/16/2011  *RADIOLOGY REPORT*  Clinical Data: Fever, hypotension.  PORTABLE CHEST - 1 VIEW  Comparison: 11/24/2004  Findings: Minimal linear opacities at the bases, likely scarring or atelectasis.  Aortic arch atherosclerosis.  Heart size within normal limits.  No pleural effusion or pneumothorax.  No acute osseous finding. Mild sclerosis within the right humeral head. Bilateral acromio-clavicular DJD.  IMPRESSION: Minimal lung base  scarring versus atelectasis.  Otherwise, no radiographic evidence for acute cardiopulmonary process.  Original Report Authenticated By: Waneta Martins, M.D.    Review of Systems  Constitutional: Positive for fever.  HENT: Negative.   Eyes: Negative.   Respiratory: Negative.   Cardiovascular: Negative.   Gastrointestinal: Negative.   Genitourinary: Negative.   Musculoskeletal: Negative.   Skin: Negative.   Neurological: Positive for weakness.  Endo/Heme/Allergies: Negative.   Psychiatric/Behavioral: Negative.    Blood pressure 83/52, pulse 112, temperature 103 F (39.4 C), temperature source Oral, resp. rate 29, height 5\' 7"  (1.702 m), weight 137 lb 5.6 oz (62.3 kg), SpO2 98.00%. Physical Exam  Constitutional: She is oriented to person, place, and time. She appears well-developed and well-nourished.  HENT:  Head: Normocephalic and atraumatic.  Eyes: Conjunctivae and EOM are normal. Pupils are equal, round, and reactive to light.  Neck: Normal range of motion. Neck supple.  Cardiovascular: Normal rate, regular rhythm and normal heart sounds.   Respiratory: Effort normal and breath sounds normal.  GI: Soft. Bowel sounds are normal.  Genitourinary:       Large area of redness and induration in right labial and perineal area. No skin necrosis. No draining fluid. Very  tender   Musculoskeletal: Normal range of motion.  Neurological: She is alert and oriented to person, place, and time.  Skin: Skin is warm and dry.  Psychiatric: She has a normal mood and affect. Her behavior is normal.    Assessment/Plan: Probable abscess in right labial area with signs of developing sepsis. I would recommend Incision and Drainage in OR. I have discussed this with pt including risks and benefits and she understands and wishes to proceed.  TOTH III,[place] Youngblood S 06/16/2011, 5:27 PM

## 2011-06-16 NOTE — Transfer of Care (Signed)
Immediate Anesthesia Transfer of Care Note  Patient: Joann [name]  Procedure(s) Performed: Procedure(s) (LRB): IRRIGATION AND DEBRIDEMENT PERIRECTAL ABSCESS (N/A)  Patient Location: PACU  Anesthesia Type: General  Level of Consciousness: oriented, sedated, patient cooperative and responds to stimulation  Airway & Oxygen Therapy: Patient Spontanous Breathing and Patient connected to face mask oxygen  Post-op Assessment: Report given to PACU RN, Post -op Vital signs reviewed and stable, Patient moving all extremities and Patient moving all extremities X 4  Post vital signs: Reviewed and stable  Complications: No apparent anesthesia complications

## 2011-06-16 NOTE — ED Notes (Signed)
Pt blood pressure trending downward. EDP Delo informed

## 2011-06-16 NOTE — Anesthesia Postprocedure Evaluation (Signed)
Anesthesia Post Note  Patient: Joann [name]  Procedure(s) Performed: Procedure(s) (LRB): IRRIGATION AND DEBRIDEMENT PERIRECTAL ABSCESS (N/A)  Anesthesia type: general  Patient location: PACU  Post pain: Pain level controlled  Post assessment: Patient's Cardiovascular Status Stable  Last Vitals:  Filed Vitals:   06/16/11 1926  BP: 108/84  Pulse: 129  Temp: 36.4 C  Resp: 29    Post vital signs: Reviewed and stable  Level of consciousness: sedated  Complications: No apparent anesthesia complications

## 2011-06-16 NOTE — Anesthesia Preprocedure Evaluation (Signed)
Anesthesia Evaluation  Patient identified by MRN, date of birth, ID band Patient awake    Reviewed: Allergy & Precautions, H&P , NPO status , Patient's Chart, lab work & pertinent test results, reviewed documented beta blocker date and time   History of Anesthesia Complications Negative for: history of anesthetic complications  Airway Mallampati: II TM Distance: >3 FB Neck ROM: Full    Dental  (+) Edentulous Upper and Dental Advisory Given   Pulmonary Current Smoker,  breath sounds clear to auscultation  Pulmonary exam normal       Cardiovascular Rhythm:Regular Rate:Normal     Neuro/Psych    GI/Hepatic negative GI ROS, Neg liver ROS,   Endo/Other  negative endocrine ROS  Renal/GU Renal InsufficiencyRenal disease     Musculoskeletal   Abdominal   Peds  Hematology   Anesthesia Other Findings   Reproductive/Obstetrics                           Anesthesia Physical Anesthesia Plan  ASA: III and Emergent  Anesthesia Plan: General   Post-op Pain Management:    Induction: Intravenous  Airway Management Planned: LMA  Additional Equipment:   Intra-op Plan:   Post-operative Plan: Extubation in OR  Informed Consent:   Dental advisory given  Plan Discussed with: CRNA and Surgeon  Anesthesia Plan Comments:         Anesthesia Quick Evaluation

## 2011-06-17 ENCOUNTER — Inpatient Hospital Stay (HOSPITAL_COMMUNITY): Payer: PRIVATE HEALTH INSURANCE

## 2011-06-17 ENCOUNTER — Encounter (HOSPITAL_COMMUNITY): Payer: Self-pay | Admitting: General Surgery

## 2011-06-17 DIAGNOSIS — A419 Sepsis, unspecified organism: Secondary | ICD-10-CM

## 2011-06-17 DIAGNOSIS — R6520 Severe sepsis without septic shock: Secondary | ICD-10-CM

## 2011-06-17 DIAGNOSIS — N731 Chronic parametritis and pelvic cellulitis: Secondary | ICD-10-CM

## 2011-06-17 LAB — PROCALCITONIN: Procalcitonin: 20.09 ng/mL

## 2011-06-17 LAB — COMPREHENSIVE METABOLIC PANEL
ALT: 10 U/L (ref 0–35)
AST: 20 U/L (ref 0–37)
Albumin: 2 g/dL — ABNORMAL LOW (ref 3.5–5.2)
Alkaline Phosphatase: 54 U/L (ref 39–117)
BUN: 20 mg/dL (ref 6–23)
CO2: 16 mEq/L — ABNORMAL LOW (ref 19–32)
Calcium: 8 mg/dL — ABNORMAL LOW (ref 8.4–10.5)
Chloride: 104 mEq/L (ref 96–112)
Creatinine, Ser: 0.83 mg/dL (ref 0.50–1.10)
GFR calc Af Amer: 75 mL/min — ABNORMAL LOW (ref >90)
GFR calc non Af Amer: 64 mL/min — ABNORMAL LOW (ref >90)
Glucose, Bld: 151 mg/dL — ABNORMAL HIGH (ref 70–99)
Potassium: 3.6 mEq/L (ref 3.5–5.1)
Sodium: 134 mEq/L — ABNORMAL LOW (ref 135–145)
Total Bilirubin: 0.9 mg/dL (ref 0.3–1.2)
Total Protein: 5.4 g/dL — ABNORMAL LOW (ref 6.0–8.3)

## 2011-06-17 LAB — URINE CULTURE
Colony Count: 4000
Culture  Setup Time: 201305191139

## 2011-06-17 LAB — CBC
HCT: 35.6 % — ABNORMAL LOW (ref 36.0–46.0)
Hemoglobin: 11.7 g/dL — ABNORMAL LOW (ref 12.0–15.0)
MCH: 28.2 pg (ref 26.0–34.0)
MCHC: 32.9 g/dL (ref 30.0–36.0)
MCV: 85.8 fL (ref 78.0–100.0)
Platelets: 218 10*3/uL (ref 150–400)
RBC: 4.15 MIL/uL (ref 3.87–5.11)
RDW: 13 % (ref 11.5–15.5)
WBC: 13.3 10*3/uL — ABNORMAL HIGH (ref 4.0–10.5)

## 2011-06-17 LAB — CORTISOL: Cortisol, Plasma: 52.9 ug/dL

## 2011-06-17 MED ORDER — NOREPINEPHRINE BITARTRATE 1 MG/ML IJ SOLN
2.0000 ug/min | INTRAMUSCULAR | Status: DC
  Administered 2011-06-17: 2 ug/min via INTRAVENOUS
  Filled 2011-06-17 (×2): qty 4

## 2011-06-17 MED ORDER — HYDRALAZINE HCL 20 MG/ML IJ SOLN: 10.0000 mg | INTRAMUSCULAR | Status: DC | PRN

## 2011-06-17 MED ORDER — SODIUM CHLORIDE 0.9 % IV SOLN
INTRAVENOUS | Status: DC
  Administered 2011-06-17: 03:00:00 via INTRAVENOUS
  Administered 2011-06-21: 500 mL via INTRAVENOUS

## 2011-06-17 NOTE — Care Management Note (Addendum)
    Page 1 of 1   06/20/2011     4:35:10 PM   CARE MANAGEMENT NOTE 06/20/2011  Patient:  Joann [name],Joann [name]   Account Number:  [id number]  Date Initiated:  06/17/2011  Documentation initiated by:  Junius Creamer  Subjective/Objective Assessment:   sepsis     Action/Plan:   lives w friend, pcp dr dawn caviness  SNF   Anticipated DC Date:     Anticipated DC Plan:  SKILLED NURSING FACILITY  In-house referral  Clinical Social Worker      DC Planning Services  CM consult      Choice offered to / List presented to:             Status of service:   Medicare Important Message given?   (If response is "NO", the following Medicare IM given date fields will be blank) Date Medicare IM given:   Date Additional Medicare IM given:    Discharge Disposition:  SKILLED NURSING FACILITY  Per UR Regulation:  Reviewed for med. necessity/level of care/duration of stay  If discussed at Long Length of Stay Meetings, dates discussed:    Comments:  06/20/11  16:32  Anette Guarneri RN/CM CSW referral for SNF  5/20 13:45p debbie dowell rn,bsn [phone]

## 2011-06-17 NOTE — Progress Notes (Signed)
Name: Joann [name] MRN: [id number] DOB: 1927-08-02    LOS: 2  PULMONARY / CRITICAL CARE MEDICINE  HPI: 78 year old female with no significant PMH except for heavy smoking of 60 packs year and dyslipidemia.  Presented on 5/19 with one to two days history of lower back pain, pelvic pain, fever and general malaise. At admission to the ER was found to be febrile and hypotensive. Her BP did not improve with one liter of NS. PCCM asked to admit.   Current Status:    - Awake, alert, oriented. Feeling better.  Vital Signs: Temp:  [97.4 F (36.3 C)-98.4 F (36.9 C)] 97.4 F (36.3 C) (05/20 0400) Pulse Rate:  [58-129] 58  (05/20 1000) Resp:  [15-33] 19  (05/20 1000) BP: (68-123)/(42-84) 123/59 mmHg (05/20 1000) SpO2:  [97 %-100 %] 100 % (05/20 1000) Weight:  [69.9 kg (154 lb 1.6 oz)] 69.9 kg (154 lb 1.6 oz) (05/20 0300)    . sodium chloride 100 mL/hr at 06/16/11 0551  . sodium chloride 75 mL/hr at 06/17/11 0254  . sodium chloride 20 mL/hr at 06/17/11 0259  . norepinephrine (LEVOPHED) Adult infusion 8 mcg/min (06/17/11 1100)    Physical Examination: General:  Pleasant female patient not in acute distress.  Neuro:  Awake, alert, oriented x 3. Non focal. HEENT:  PERRL, pink conjunctivae, dry mucous membranes Neck:  Supple, no JVD   Cardiovascular:  RRR, no M/R/G Lungs:  CTA Abdomen:  Soft, mildly tender on the lower abdomen, nondistended, bowel sounds present. Mild CVA tenderness on the left. Gen: indurated rt labial pelvis,tender,diffuse, c/w abscess Musculoskeletal:  Moves all extremities, no pedal edema Skin:  No rash  Active problems: Sepsis  ASSESSMENT AND PLAN  PULMONARY  Lab 06/16/11 1804  PHART 7.353  PCO2ART 30.2*  PO2ART 81.0  HCO3 16.8*  O2SAT 96.0   CXR:  No acute infiltrates on X ray  A:Tachypnea - On 5/20 - normal resp drive P:  -pulse ox while in ICU   CARDIOVASCULAR  Lab 06/16/11 2000 06/15/11 2337 06/15/11 2318  TROPONINI -- -- <0.30  LATICACIDVEN  2.4* 2.0 --  PROBNP -- -- --   ECG:  Pending Lines: CVL palced 06/17/11.  A: Tachycardia, SIRS/Sepsis.   - on 5/20 back on levophed P:  -cont IVFs -recheck lactate trend -see ID section  RENAL  Lab 06/17/11 0530 06/16/11 0612 06/15/11 2316  NA 134* 133* 131*  K 3.6 4.1 --  CL 104 98 97  CO2 16* 20 16*  BUN 20 19 17   CREATININE 0.83 1.17* 1.30*  CALCIUM 8.0* 9.2 9.4  MG -- 1.7 --  PHOS -- 2.4 --   Intake/Output      05/19 0701 - 05/20 0700 05/20 0701 - 05/21 0700   I.V. (mL/kg) 3007.3 (43) 530 (7.6)   IV Piggyback 2950    Total Intake(mL/kg) 5957.3 (85.2) 530 (7.6)   Urine (mL/kg/hr) 800 (0.5)    Blood 50    Total Output 850    Net +5107.3 +530         CT abd/pelvis 5/19: Symmetric renal size. Images through the lower right kidney are degraded by respiratory motion. Subcentimeter hypodensity within  the lower pole of the left kidney is favored to represent a cyst. No hydronephrosis or hydroureter  A:  -Acute renal failure, likely pre renal. Improved w/ IVFs.  P:   1) Continue IVF resuscitation 2) Will follow CMP in am  GASTROINTESTINAL  Lab 06/17/11 0530 06/15/11 2316  AST 20 22  ALT 10 11  ALKPHOS 54 82  BILITOT 0.9 1.3*  PROT 5.4* 7.1  ALBUMIN 2.0* 3.2*  CT abd/pelvis 5/19: Stranding of the right mesorectal, perivaginal, and ischioanal fat, with extension inferiorly to involve the right perineum and  posterior subcutaneous fat. Correlate with direct inspection. May  reflect an infectious process originating from the rectum or  vagina. No associated loculated fluid collection appreciated. A: Source unclear to me urine P:   1) Regular diet, NPO may need OR 2) PO protonix for GI prophylaxis See ID  HEMATOLOGIC  Lab 06/17/11 0530 06/16/11 0612 06/15/11 2316  HGB 11.7* 14.3 13.9  HCT 35.6* 41.7 41.5  PLT 218 186 227  INR -- -- --  APTT -- -- --   A:  No evidence of bleeding P: - - PRBC for hgb </= 6.9gm%    - exceptions are   -  if ACS  susepcted/confirmed then transfuse for hgb </= 8.0gm%,  or    -  If septic shock first 24h and scvo2 < 70% then transfuse for hgb </= 9.0gm%   - active bleeding with hemodynamic instability, then transfuse regardless of hemoglobin value   At at all times try to transfuse 1 unit prbc as possible with exception of active hemorrhage    INFECTIOUS  Lab 06/17/11 0530 06/16/11 0612 06/15/11 2319 06/15/11 2316  WBC 13.3* 9.2 -- 14.1*  PROCALCITON 20.09 -- 13.07 --   Cultures: Will follow UA, urine, blood cultures Antibiotics: Zosyn and vancomycin A: -On 5/19: PCCM MD exam - pelvis c/w abscess ( this is the source) / s/p surgery P:   1) Zosyn and vanc 2) continued fevers,add clinda toxin inh until r/o soft tissue 3) per [facility] I ENDOCRINE No results found for this basename: GLUCAP:5 in the last 168 hours A:   -No history of DM  - cortisol then stress steroid snow  NEUROLOGIC  A:  Pain control, r/o septic enceph  -on 5/20 - normal mental status.  P: monitor abg  BEST PRACTICE / DISPOSITION - Level of Care:  ICU - Primary Service:  PCCM - Consultants:  None - Code Status:  Full - Diet:  Regular - DVT Px:  Heparin - GI Px:  Protonix - Skin Integrity:  Intact. - Social / Family:  Family informed at bedside on 5/19 by Dr Tyson Alias. No family at bedside 5/20    The patient is critically ill with multiple organ systems failure and requires high complexity decision making for assessment and support, frequent evaluation and titration of therapies, application of advanced monitoring technologies and extensive interpretation of multiple databases.   Critical Care Time devoted to patient care services described in this note is  31  Minutes.  Dr. Kalman Shan, M.D., [facility].C.P Pulmonary and Critical Care Medicine Staff Physician [facility] Pager: [phone], If no answer or between  15:00h - 7:00h: call [phone]  06/17/2011 12:19 PM

## 2011-06-17 NOTE — Procedures (Signed)
Central Venous Catheter Insertion Procedure Note Joann [name] [id number] 1927-10-12  Procedure: Insertion of Central Venous Catheter Indications: Assessment of intravascular volume and Drug and/or fluid administration  Procedure Details Consent: Risks of procedure as well as the alternatives and risks of each were explained to the (patient/caregiver).  Consent for procedure obtained. Time Out: Verified patient identification, verified procedure, site/side was marked, verified correct patient position, special equipment/implants available, medications/allergies/relevent history reviewed, required imaging and test results available.  Performed  Maximum sterile technique was used including antiseptics, cap, gloves, gown, hand hygiene, mask and sheet. Skin prep: Chlorhexidine; local anesthetic administered A antimicrobial bonded/coated triple lumen catheter was placed in the right internal jugular vein using the Seldinger technique. Ultrasound used for vessel identification.  Evaluation Blood flow good Complications: No apparent complications Patient did tolerate procedure well. Chest X-ray ordered to verify placement.  CXR: pending.  Joann [name] 06/17/2011, 2:20 AM

## 2011-06-17 NOTE — Progress Notes (Signed)
Patient ID: Joann [name], female   DOB: 21-May-1927, 78 y.o.   MRN: [id number] 1 Day Post-Op  Subjective: Pt feels much better today.  hungry  Objective: Vital signs in last 24 hours: Temp:  [97.4 F (36.3 C)-103 F (39.4 C)] 97.4 F (36.3 C) (05/20 0400) Pulse Rate:  [63-129] 63  (05/20 0700) Resp:  [15-33] 17  (05/20 0700) BP: (68-112)/(42-84) 111/57 mmHg (05/20 0700) SpO2:  [97 %-100 %] 100 % (05/20 0700) Weight:  [154 lb 1.6 oz (69.9 kg)] 154 lb 1.6 oz (69.9 kg) (05/20 0300)    Intake/Output from previous day: 05/19 0701 - 05/20 0700 In: 5817.3 [I.V.:2867.3; IV Piggyback:2950] Out: 850 [Urine:800; Blood:50] Intake/Output this shift:    PE: GU: large right labial wound.  Packing removed.  Wound is clean with no purulent drainage.  This was repacked.  Lab Results:   Basename 06/17/11 0530 06/16/11 0612  WBC 13.3* 9.2  HGB 11.7* 14.3  HCT 35.6* 41.7  PLT 218 186   BMET  Basename 06/17/11 0530 06/16/11 0612  NA 134* 133*  K 3.6 4.1  CL 104 98  CO2 16* 20  GLUCOSE 151* 146*  BUN 20 19  CREATININE 0.83 1.17*  CALCIUM 8.0* 9.2   PT/INR No results found for this basename: LABPROT:2,INR:2 in the last 72 hours CMP     Component Value Date/Time   NA 134* 06/17/2011 0530   K 3.6 06/17/2011 0530   CL 104 06/17/2011 0530   CO2 16* 06/17/2011 0530   GLUCOSE 151* 06/17/2011 0530   BUN 20 06/17/2011 0530   CREATININE 0.83 06/17/2011 0530   CREATININE 0.84 03/07/2011 1016   CALCIUM 8.0* 06/17/2011 0530   PROT 5.4* 06/17/2011 0530   ALBUMIN 2.0* 06/17/2011 0530   AST 20 06/17/2011 0530   ALT 10 06/17/2011 0530   ALKPHOS 54 06/17/2011 0530   BILITOT 0.9 06/17/2011 0530   GFRNONAA 64* 06/17/2011 0530   GFRAA 75* 06/17/2011 0530   Lipase  No results found for this basename: lipase       Studies/Results: Ct Abdomen Pelvis Wo Contrast  06/16/2011  *RADIOLOGY REPORT*  Clinical Data: Fever, hypotension, abdominal and back pain.  CT ABDOMEN AND PELVIS WITHOUT CONTRAST  Technique:   Multidetector CT imaging of the abdomen and pelvis was performed following the standard protocol without intravenous contrast.  Comparison: 04/10/2005  Findings: Coronary artery calcification.  Heart size upper normal limits.  No pleural or pericardial effusion.  Calcified right lower lobe nodule.  Mild dependent scarring versus atelectasis.  2 mm subpleural nodules within the left lower lobe.  Given the calcified nodule within the right lower lobe, likely secondary to the same process.  Intra-abdominal organ evaluation is limited in the absence of intravenous contrast.  Within this limitation, unremarkable liver, spleen, pancreas, adrenal glands.  Cholelithiasis.  No biliary ductal dilatation.  Symmetric renal size.  Images through the lower right kidney are degraded by respiratory motion.  Subcentimeter hypodensity within the lower pole of the left kidney is favored to represent a cyst. No hydronephrosis or hydroureter.  No bowel obstruction.  No CT evidence for colitis.  Colonic diverticulosis.  Appendix not identified however no right lower quadrant inflammation.  Thin-walled bladder.  Absent uterus. Air within the vagina is nonspecific.  No adnexal mass.  There is a significant amount of stranding within the right mesorectal fat, with mild extension into the right ischioanal fat.  On the lower images, significant stranding within the right perineum and subcutaneous fat along  the right gluteal cleft.  Osteopenia. Multilevel degenerative changes of the imaged spine. No acute or aggressive appearing osseous lesion.  Mild vertebral body height loss at L4 is age indeterminate however has progressed from 2007.  IMPRESSION: Stranding of the right mesorectal, perivaginal, and ischioanal fat, with extension inferiorly to involve the right perineum and posterior subcutaneous fat. Correlate with direct inspection.  May reflect an infectious process originating from the rectum or vagina. No associated loculated fluid  collection appreciated.  An underlying mass cannot be excluded by this noncontrast examination.  Cholelithiasis.  Mild compression fracture of the L4 vertebral body is age indeterminate.  Correlate with point tenderness.  Original Report Authenticated By: Waneta Martins, M.D.   Dg Chest Port 1 View  06/17/2011  *RADIOLOGY REPORT*  Clinical Data: Shortness of breath.  PORTABLE CHEST - 1 VIEW  Comparison: 06/17/2011.  Findings: Right IJ central line tip projects over the SVC.  Heart size stable.  Thoracic aorta is calcified.  Trachea is midline, given slight patient rotation.  Lungs are somewhat low in volume with bibasilar air space disease, left greater than right.  Mild central pulmonary vascular congestion.  There may be a small left pleural effusion.  IMPRESSION:  1.  Bibasilar air space disease, left greater than right. Pneumonia could have this appearance. 2.  Central pulmonary vascular congestion and small left pleural effusion.  Original Report Authenticated By: Reyes Ivan, M.D.   Dg Chest Port 1 View  06/17/2011  *RADIOLOGY REPORT*  Clinical Data: Central line placement  PORTABLE CHEST - 1 VIEW  Comparison: 06/15/2011  Findings: Interval right IJ central venous catheter placement with tip projecting over the proximal SVC.  No pneumothorax identified.  Cardiomegaly.  Retrocardiac opacity not excluded.  Central vascular congestion.  Mild lung base opacities.  No acute osseous finding.  IMPRESSION: Right IJ catheter tip projects over the proximal SVC.  No pneumothorax.  Bibasilar/retrocardiac opacities are nonspecific, similar to prior.  Original Report Authenticated By: Waneta Martins, M.D.   Dg Chest Port 1 View  06/16/2011  *RADIOLOGY REPORT*  Clinical Data: Fever, hypotension.  PORTABLE CHEST - 1 VIEW  Comparison: 11/24/2004  Findings: Minimal linear opacities at the bases, likely scarring or atelectasis.  Aortic arch atherosclerosis.  Heart size within normal limits.  No pleural  effusion or pneumothorax.  No acute osseous finding. Mild sclerosis within the right humeral head. Bilateral acromio-clavicular DJD.  IMPRESSION: Minimal lung base scarring versus atelectasis.  Otherwise, no radiographic evidence for acute cardiopulmonary process.  Original Report Authenticated By: Waneta Martins, M.D.    Anti-infectives: Anti-infectives     Start     Dose/Rate Route Frequency Ordered Stop   06/16/11 2200   vancomycin (VANCOCIN) 750 mg in sodium chloride 0.9 % 150 mL IVPB        750 mg 150 mL/hr over 60 Minutes Intravenous Daily at bedtime 06/16/11 0327     06/16/11 1800   clindamycin (CLEOCIN) IVPB 600 mg  Status:  Discontinued        600 mg 100 mL/hr over 30 Minutes Intravenous 4 times per day 06/16/11 1652 06/16/11 1700   06/16/11 1715   clindamycin (CLEOCIN) IVPB 900 mg        900 mg 100 mL/hr over 30 Minutes Intravenous 3 times per day 06/16/11 1700     06/16/11 0800  piperacillin-tazobactam (ZOSYN) IVPB 3.375 g       3.375 g 12.5 mL/hr over 240 Minutes Intravenous 3 times per day 06/16/11  0327     06/16/11 0115   piperacillin-tazobactam (ZOSYN) IVPB 3.375 g  Status:  Discontinued        3.375 g 100 mL/hr over 30 Minutes Intravenous  Once 06/16/11 0113 06/16/11 0140   06/16/11 0115   vancomycin (VANCOCIN) IVPB 1000 mg/200 mL premix  Status:  Discontinued        1,000 mg 200 mL/hr over 60 Minutes Intravenous  Once 06/16/11 0113 06/16/11 0140   06/16/11 0015   vancomycin (VANCOCIN) IVPB 1000 mg/200 mL premix        1,000 mg 200 mL/hr over 60 Minutes Intravenous  Once 06/16/11 0002 06/16/11 0210   06/16/11 0015  piperacillin-tazobactam (ZOSYN) IVPB 3.375 g       3.375 g 12.5 mL/hr over 240 Minutes Intravenous  Once 06/16/11 0002 06/16/11 0436           Assessment/Plan  1. Right labial abscess, s/p I&D  Plan: 1. May have a diet 2. Start NS WD dressing changes to wound BID.  Await cx.  Cont abx therapy   LOS: 2 days    Mattis Featherly  E 06/17/2011

## 2011-06-18 LAB — PHOSPHORUS: Phosphorus: 2.5 mg/dL (ref 2.3–4.6)

## 2011-06-18 LAB — LACTIC ACID, PLASMA: Lactic Acid, Venous: 2 mmol/L (ref 0.5–2.2)

## 2011-06-18 LAB — MAGNESIUM: Magnesium: 1.7 mg/dL (ref 1.5–2.5)

## 2011-06-18 LAB — PROCALCITONIN: Procalcitonin: 17.42 ng/mL

## 2011-06-18 MED ORDER — MAGNESIUM SULFATE 50 % IJ SOLN: 2.0000 g | Freq: Once | INTRAVENOUS | Status: DC

## 2011-06-18 MED ORDER — SODIUM CHLORIDE 0.9 % IV BOLUS (SEPSIS)
1000.0000 mL | Freq: Once | INTRAVENOUS | Status: AC
  Administered 2011-06-18: 1000 mL via INTRAVENOUS

## 2011-06-18 MED ORDER — MAGNESIUM SULFATE 40 MG/ML IJ SOLN
2.0000 g | Freq: Once | INTRAMUSCULAR | Status: AC
  Administered 2011-06-18: 2 g via INTRAVENOUS
  Filled 2011-06-18: qty 50

## 2011-06-18 NOTE — Progress Notes (Signed)
Patient ID: Joann [name], female   DOB: 1927-07-01, 78 y.o.   MRN: [id number] Labial wound re examined.  Margins are clean.  She is tolerating repacking well.

## 2011-06-18 NOTE — Progress Notes (Signed)
eLink Physician-Brief Progress Note Patient Name: Joann [name] DOB: 11-Feb-1927 MRN: [id number]  Date of Service  06/18/2011   HPI/Events of Note   Evolving hypotension, norepi was weaned to off ~ 17:00. CVP is 9.   eICU Interventions  NS 1000cc bolus, follow BP and CVP   Intervention Category Major Interventions: Shock - evaluation and management  Rami Budhu S. 06/18/2011, 12:06 AM

## 2011-06-18 NOTE — Progress Notes (Addendum)
Name: Joann [name] MRN: [id number] DOB: Nov 09, 1927    LOS: 3  PULMONARY / CRITICAL CARE MEDICINE  HPI: 78 year old female with no significant PMH except for heavy smoking of 60 packs year and dyslipidemia.  Presented on 5/19 with one to two days history of lower back pain, pelvic pain, fever and general malaise. At admission to the ER was found to be febrile and hypotensive. Her BP did not improve with one liter of NS. PCCM asked to admit.   Current Status:    - Awake, alert, oriented. Feeling better. - Off pressors but MAP 60 (baseline sbp 150 per patient)  Vital Signs: Temp:  [97.3 F (36.3 C)-98.7 F (37.1 C)] 97.7 F (36.5 C) (05/21 1200) Pulse Rate:  [62-101] 81  (05/21 1200) Resp:  [15-27] 25  (05/21 1200) BP: (75-110)/(44-66) 95/62 mmHg (05/21 1200) SpO2:  [96 %-100 %] 97 % (05/21 1200) Weight:  [73.9 kg (162 lb 14.7 oz)] 73.9 kg (162 lb 14.7 oz) (05/21 0437)    . sodium chloride 100 mL/hr at 06/16/11 0551  . sodium chloride 75 mL/hr at 06/17/11 0254  . sodium chloride 20 mL/hr at 06/17/11 0259  . norepinephrine (LEVOPHED) Adult infusion Stopped (06/17/11 1700)    Physical Examination: General:  Pleasant female patient not in acute distress.  Neuro:  Awake, alert, oriented x 3. Non focal. HEENT:  PERRL, pink conjunctivae, dry mucous membranes Neck:  Supple, no JVD   Cardiovascular:  RRR, no M/R/G Lungs:  CTA Abdomen:  Soft, mildly tender on the lower abdomen, nondistended, bowel sounds present. Gyn: Not examined Musculoskeletal:  Moves all extremities, no pedal edema Skin:  No rash  Active problems: Sepsis  ASSESSMENT AND PLAN  PULMONARY  Lab 06/16/11 1804  PHART 7.353  PCO2ART 30.2*  PO2ART 81.0  HCO3 16.8*  O2SAT 96.0   CXR:  No acute infiltrates on X ray  A:Tachypnea - On 5/20 - normal resp drive P:  -pulse ox while in ICU   CARDIOVASCULAR  Lab 06/18/11 0522 06/16/11 2000 06/15/11 2337 06/15/11 2318  TROPONINI -- -- -- <0.30  LATICACIDVEN  2.0 2.4* 2.0 --  PROBNP -- -- -- --    Lab 06/18/11 0521 06/17/11 0530 06/15/11 2319  PROCALCITON 17.42 20.09 13.07     ECG:  Pending Lines: CVL palced 06/17/11.  A: Tachycardia, SIRS/Sepsis.   - on 5/20 back on levophed  -on 5/21 -off pressors but bp spft and has cleared lactate but not clearingPCT P:  - fluid bolus -cont IVFs -recheck lactate trend -see ID section  RENAL  Lab 06/18/11 0521 06/17/11 0530 06/16/11 0612 06/15/11 2316  NA -- 134* 133* 131*  K -- 3.6 4.1 --  CL -- 104 98 97  CO2 -- 16* 20 16*  BUN -- 20 19 17   CREATININE -- 0.83 1.17* 1.30*  CALCIUM -- 8.0* 9.2 9.4  MG 1.7 -- 1.7 --  PHOS 2.5 -- 2.4 --   Intake/Output      05/20 0701 - 05/21 0700 05/21 0701 - 05/22 0700   P.O. 600 360   I.V. (mL/kg) 2430 (32.9) 375 (5.1)   Other 550    IV Piggyback 200 1000   Total Intake(mL/kg) 3780 (51.2) 1735 (23.5)   Urine (mL/kg/hr) 190 (0.1) 275 (0.7)   Blood     Total Output 190 275   Net +3590 +1460         CT abd/pelvis 5/19: Symmetric renal size. Images through the lower right kidney are degraded  by respiratory motion. Subcentimeter hypodensity within  the lower pole of the left kidney is favored to represent a cyst. No hydronephrosis or hydroureter  A:  -Acute renal failure, likely pre renal. Improved w/ IVFs.  And resolved to normal 5/20 - Hypomagnesemia  P:   1) Continue IVF resuscitation 2) Will follow CMP in am 3) replete mag 06/18/2011   GASTROINTESTINAL  Lab 06/17/11 0530 06/15/11 2316  AST 20 22  ALT 10 11  ALKPHOS 54 82  BILITOT 0.9 1.3*  PROT 5.4* 7.1  ALBUMIN 2.0* 3.2*  CT abd/pelvis 5/19: Stranding of the right mesorectal, perivaginal, and ischioanal fat, with extension inferiorly to involve the right perineum and  posterior subcutaneous fat. Correlate with direct inspection. May  reflect an infectious process originating from the rectum or  vagina. No associated loculated fluid collection appreciated. A: P:   1) Regular diet,   2) PO protonix for GI prophylaxis See ID  HEMATOLOGIC  Lab 06/17/11 0530 06/16/11 0612 06/15/11 2316  HGB 11.7* 14.3 13.9  HCT 35.6* 41.7 41.5  PLT 218 186 227  INR -- -- --  APTT -- -- --   A:  No evidence of bleeding P: - - PRBC for hgb </= 6.9gm%    - exceptions are   -  if ACS susepcted/confirmed then transfuse for hgb </= 8.0gm%,  or    -  If septic shock first 24h and scvo2 < 70% then transfuse for hgb </= 9.0gm%   - active bleeding with hemodynamic instability, then transfuse regardless of hemoglobin value   At at all times try to transfuse 1 unit prbc as possible with exception of active hemorrhage    INFECTIOUS  Lab 06/18/11 0521 06/17/11 0530 06/16/11 0612 06/15/11 2319 06/15/11 2316  WBC -- 13.3* 9.2 -- 14.1*  PROCALCITON 17.42 20.09 -- 13.07 --   Cultures: Results for orders placed during the hospital encounter of 06/15/11  CULTURE, BLOOD (ROUTINE X 2)     Status: Normal (Preliminary result)   Collection Time   06/15/11 11:30 PM      Component Value Range Status Comment   Specimen Description BLOOD RIGHT HAND   Final    Special Requests BOTTLES DRAWN AEROBIC AND ANAEROBIC 5CC EACH   Final    Culture  Setup Time [id number]   Final    Culture     Final    Value:        BLOOD CULTURE RECEIVED NO GROWTH TO DATE CULTURE WILL BE HELD FOR 5 DAYS BEFORE ISSUING A FINAL NEGATIVE REPORT   Report Status PENDING   Incomplete   CULTURE, BLOOD (ROUTINE X 2)     Status: Normal (Preliminary result)   Collection Time   06/15/11 11:35 PM      Component Value Range Status Comment   Specimen Description BLOOD RIGHT HAND   Final    Special Requests BOTTLES DRAWN AEROBIC AND ANAEROBIC [facility] EACH   Final    Culture  Setup Time [id number]   Final    Culture     Final    Value:        BLOOD CULTURE RECEIVED NO GROWTH TO DATE CULTURE WILL BE HELD FOR 5 DAYS BEFORE ISSUING A FINAL NEGATIVE REPORT   Report Status PENDING   Incomplete   MRSA PCR SCREENING     Status: Abnormal    Collection Time   06/16/11  2:12 AM      Component Value Range Status Comment   MRSA by  PCR POSITIVE (*) NEGATIVE  Final   URINE CULTURE     Status: Normal   Collection Time   06/16/11  5:38 AM      Component Value Range Status Comment   Specimen Description URINE, CLEAN CATCH   Final    Special Requests NONE   Final    Culture  Setup Time [id number]   Final    Colony Count 4,000 COLONIES/ML   Final    Culture INSIGNIFICANT GROWTH   Final    Report Status 06/17/2011 FINAL   Final   ANAEROBIC CULTURE     Status: Normal (Preliminary result)   Collection Time   06/16/11  6:59 PM      Component Value Range Status Comment   Specimen Description ABSCESS LABIA   Final    Special Requests NONE   Final    Gram Stain     Final    Value: RARE WBC PRESENT,BOTH PMN AND MONONUCLEAR     RARE SQUAMOUS EPITHELIAL CELLS PRESENT     RARE GRAM POSITIVE COCCI IN PAIRS   Culture     Final    Value: NO ANAEROBES ISOLATED; CULTURE IN PROGRESS FOR 5 DAYS   Report Status PENDING   Incomplete   CULTURE, ROUTINE-ABSCESS     Status: Normal (Preliminary result)   Collection Time   06/16/11  6:59 PM      Component Value Range Status Comment   Specimen Description ABSCESS LABIA   Final    Special Requests NONE   Final    Gram Stain     Final    Value: RARE WBC PRESENT,BOTH PMN AND MONONUCLEAR     RARE SQUAMOUS EPITHELIAL CELLS PRESENT     RARE GRAM POSITIVE COCCI IN PAIRS   Culture     Final    Value: MODERATE STAPHYLOCOCCUS AUREUS     Note: RIFAMPIN AND GENTAMICIN SHOULD NOT BE USED AS SINGLE DRUGS FOR TREATMENT OF STAPH INFECTIONS.   Report Status PENDING   Incomplete      cultures Antibiotics: Results for orders placed during the hospital encounter of 06/15/11  CULTURE, BLOOD (ROUTINE X 2)     Status: Normal (Preliminary result)   Collection Time   06/15/11 11:30 PM      Component Value Range Status Comment   Specimen Description BLOOD RIGHT HAND   Final    Special Requests BOTTLES DRAWN AEROBIC  AND ANAEROBIC 5CC EACH   Final    Culture  Setup Time [id number]   Final    Culture     Final    Value:        BLOOD CULTURE RECEIVED NO GROWTH TO DATE CULTURE WILL BE HELD FOR 5 DAYS BEFORE ISSUING A FINAL NEGATIVE REPORT   Report Status PENDING   Incomplete   CULTURE, BLOOD (ROUTINE X 2)     Status: Normal (Preliminary result)   Collection Time   06/15/11 11:35 PM      Component Value Range Status Comment   Specimen Description BLOOD RIGHT HAND   Final    Special Requests BOTTLES DRAWN AEROBIC AND ANAEROBIC [facility]   Final    Culture  Setup Time [id number]   Final    Culture     Final    Value:        BLOOD CULTURE RECEIVED NO GROWTH TO DATE CULTURE WILL BE HELD FOR 5 DAYS BEFORE ISSUING A FINAL NEGATIVE REPORT   Report Status PENDING   Incomplete  MRSA PCR SCREENING     Status: Abnormal   Collection Time   06/16/11  2:12 AM      Component Value Range Status Comment   MRSA by PCR POSITIVE (*) NEGATIVE  Final   URINE CULTURE     Status: Normal   Collection Time   06/16/11  5:38 AM      Component Value Range Status Comment   Specimen Description URINE, CLEAN CATCH   Final    Special Requests NONE   Final    Culture  Setup Time [id number]   Final    Colony Count 4,000 COLONIES/ML   Final    Culture INSIGNIFICANT GROWTH   Final    Report Status 06/17/2011 FINAL   Final   ANAEROBIC CULTURE     Status: Normal (Preliminary result)   Collection Time   06/16/11  6:59 PM      Component Value Range Status Comment   Specimen Description ABSCESS LABIA   Final    Special Requests NONE   Final    Gram Stain     Final    Value: RARE WBC PRESENT,BOTH PMN AND MONONUCLEAR     RARE SQUAMOUS EPITHELIAL CELLS PRESENT     RARE GRAM POSITIVE COCCI IN PAIRS   Culture     Final    Value: NO ANAEROBES ISOLATED; CULTURE IN PROGRESS FOR 5 DAYS   Report Status PENDING   Incomplete   CULTURE, ROUTINE-ABSCESS     Status: Normal (Preliminary result)   Collection Time   06/16/11  6:59 PM       Component Value Range Status Comment   Specimen Description ABSCESS LABIA   Final    Special Requests NONE   Final    Gram Stain     Final    Value: RARE WBC PRESENT,BOTH PMN AND MONONUCLEAR     RARE SQUAMOUS EPITHELIAL CELLS PRESENT     RARE GRAM POSITIVE COCCI IN PAIRS   Culture     Final    Value: MODERATE STAPHYLOCOCCUS AUREUS     Note: RIFAMPIN AND GENTAMICIN SHOULD NOT BE USED AS SINGLE DRUGS FOR TREATMENT OF STAPH INFECTIONS.   Report Status PENDING   Incomplete       A:  -On 5/19: PCCM MD exam - pelvis c/w abscess ( this is the source) / s/p surgery  - on 5/21- fevers resolved with surgery and antibotic P:   1) Antibiotics per flow sheet 2)  per [facility] I ENDOCRINE No results found for this basename: GLUCAP:5 in the last 168 hours A:   -No history of DM  - cortisol then stress steroid snow  NEUROLOGIC  A:  Pain control, r/o septic enceph  -on 5/20 - normal mental status.  P: monitor abg  BEST PRACTICE / DISPOSITION - Level of Care:  ICU - Primary Service:  PCCM - Consultants:  None - Code Status:  Full - Diet:  Regular - DVT Px:  Heparin - GI Px:  Protonix - Skin Integrity:  Intact. - Social / Family:  Family informed at bedside on 5/19 by Dr Tyson Alias. No family at bedside 5/20. Son at bedside 5/21.     The patient is critically ill with multiple organ systems failure and requires high complexity decision making for assessment and support, frequent evaluation and titration of therapies, application of advanced monitoring technologies and extensive interpretation of multiple databases.   Critical Care Time devoted to patient care services described in this note is  21  Minutes.  Dr. Kalman Shan, M.D., [facility].C.P Pulmonary and Critical Care Medicine Staff Physician  System [facility] Pager: [phone], If no answer or between  15:00h - 7:00h: call [phone]  06/18/2011 12:15 PM

## 2011-06-19 LAB — COMPREHENSIVE METABOLIC PANEL
ALT: 19 U/L (ref 0–35)
AST: 26 U/L (ref 0–37)
Albumin: 1.8 g/dL — ABNORMAL LOW (ref 3.5–5.2)
Alkaline Phosphatase: 46 U/L (ref 39–117)
BUN: 25 mg/dL — ABNORMAL HIGH (ref 6–23)
CO2: 19 mEq/L (ref 19–32)
Calcium: 8 mg/dL — ABNORMAL LOW (ref 8.4–10.5)
Chloride: 106 mEq/L (ref 96–112)
Creatinine, Ser: 0.87 mg/dL (ref 0.50–1.10)
GFR calc Af Amer: 70 mL/min — ABNORMAL LOW (ref >90)
GFR calc non Af Amer: 61 mL/min — ABNORMAL LOW (ref >90)
Glucose, Bld: 112 mg/dL — ABNORMAL HIGH (ref 70–99)
Potassium: 3.2 mEq/L — ABNORMAL LOW (ref 3.5–5.1)
Sodium: 135 mEq/L (ref 135–145)
Total Bilirubin: 0.3 mg/dL (ref 0.3–1.2)
Total Protein: 4.9 g/dL — ABNORMAL LOW (ref 6.0–8.3)

## 2011-06-19 LAB — CBC
HCT: 31.8 % — ABNORMAL LOW (ref 36.0–46.0)
Hemoglobin: 10.6 g/dL — ABNORMAL LOW (ref 12.0–15.0)
MCH: 27.9 pg (ref 26.0–34.0)
MCHC: 33.3 g/dL (ref 30.0–36.0)
MCV: 83.7 fL (ref 78.0–100.0)
Platelets: 201 10*3/uL (ref 150–400)
RBC: 3.8 MIL/uL — ABNORMAL LOW (ref 3.87–5.11)
RDW: 13 % (ref 11.5–15.5)
WBC: 9.5 10*3/uL (ref 4.0–10.5)

## 2011-06-19 LAB — PHOSPHORUS: Phosphorus: 2.8 mg/dL (ref 2.3–4.6)

## 2011-06-19 LAB — CULTURE, ROUTINE-ABSCESS

## 2011-06-19 LAB — VANCOMYCIN, TROUGH: Vancomycin Tr: 7 ug/mL — ABNORMAL LOW (ref 10.0–20.0)

## 2011-06-19 LAB — MAGNESIUM: Magnesium: 2 mg/dL (ref 1.5–2.5)

## 2011-06-19 MED ORDER — POTASSIUM CHLORIDE CRYS ER 20 MEQ PO TBCR
40.0000 meq | EXTENDED_RELEASE_TABLET | Freq: Once | ORAL | Status: AC
  Administered 2011-06-19: 40 meq via ORAL
  Filled 2011-06-19: qty 2

## 2011-06-19 MED ORDER — POTASSIUM CHLORIDE 10 MEQ/50ML IV SOLN
10.0000 meq | INTRAVENOUS | Status: AC
  Administered 2011-06-19 (×3): 10 meq via INTRAVENOUS
  Filled 2011-06-19 (×3): qty 50

## 2011-06-19 MED ORDER — PANTOPRAZOLE SODIUM 40 MG PO TBEC
DELAYED_RELEASE_TABLET | ORAL | Status: AC
  Filled 2011-06-19: qty 1

## 2011-06-19 NOTE — Progress Notes (Signed)
Physical Therapy Evaluation Note  Past Medical History  Diagnosis Date  . Hypercholesteremia    Past Surgical History  Procedure Date  . Incision and drainage perirectal abscess 06/16/2011    Procedure: IRRIGATION AND DEBRIDEMENT PERIRECTAL ABSCESS;  Surgeon: Robyne Askew, MD;  Location: [facility] OR;  Service: General;  Laterality: N/A;      06/19/11 1407  PT Visit Information  Last PT Received On 06/19/11  PT Time Calculation  PT Start Time 1407  PT Stop Time 1437  PT Time Calculation (min) 30 min  Subjective Data  Subjective Pt received supine in bed, denies pain meds.  Precautions  Precautions Fall  Restrictions  Weight Bearing Restrictions No  Home Living  Lives With Significant other  Available Help at Discharge Available PRN/intermittently  Type of Home Apartment  Home Access Level entry  Home Layout One level  Bathroom Shower/Tub Tub/shower unit;Curtain  Horticulturist, commercial No  How Accessible (per family pt apt full of clutter and minimal abiltiy to amb)  Home Adaptive Equipment None  Prior Function  Level of Independence Independent  Able to Take Stairs? No  Driving No  Vocation Retired  Geneticist, molecular No difficulties  Cognition  Overall Cognitive Status Impaired  Area of Impairment Safety/judgement;Awareness of deficits  Arousal/Alertness Awake/alert  Orientation Level Appears intact for tasks assessed  Behavior During Session [facility] for tasks performed  Safety/Judgement Decreased safety judgement for tasks assessed;Decreased awareness of safety precautions;Impulsive  Awareness of Deficits decreased  Right Upper Extremity Assessment  RUE ROM/Strength/Tone WFL for tasks assessed  Left Upper Extremity Assessment  LUE ROM/Strength/Tone WFL  Right Lower Extremity Assessment  RLE ROM/Strength/Tone WFL  Left Lower Extremity Assessment  LLE ROM/Strength/Tone WFL  Trunk Assessment  Trunk Assessment Normal  Bed Mobility   Bed Mobility Supine to Sit  Supine to Sit 4: Min assist;HOB flat  Details for Bed Mobility Assistance assist for trunk elevation, pt initiated LE to EOB  Transfers  Transfers Sit to Stand;Stand to Sit  Sit to Stand 4: Min assist;With upper extremity assist;From bed  Stand to Sit 4: Min assist;With upper extremity assist;With armrests;To chair/3-in-1  Details for Transfer Assistance v/c's for safety, hand placement, pt with impulsivity and decreased safey awareness  Ambulation/Gait  Ambulation/Gait Assistance 4: Min assist  Ambulation Distance (Feet) 75 Feet  Assistive device Rolling walker  Ambulation/Gait Assistance Details pt with 2 episodes of LOB requiring minA to maintain balance  Gait Pattern Step-through pattern;Decreased stride length  Gait velocity slower than PTA  Stairs No  Balance  Balance Assessed Yes  PT - End of Session  Equipment Utilized During Treatment Gait belt  Activity Tolerance Patient tolerated treatment well  Patient left in chair;with call bell/phone within reach;with family/visitor present  Nurse Communication Mobility status  PT Assessment  Clinical Impression Statement Pt s/p R labial abcess I &D presenting with increased fall risk, generalized weakness, and requires AD and assist for all mobility. Patient unsafe to return home due to patient primary caregiver to 78 yo companion and no assist avail to patient. Patient and family educated on rehab facility/snf placement and are in agreeance.  PT Recommendation/Assessment Patient needs continued PT services  PT Problem List Decreased strength;Decreased activity tolerance;Decreased balance;Decreased safety awareness  Barriers to Discharge Decreased caregiver support;Inaccessible home environment  Barriers to Discharge Comments patient unable to use RW in home or to access bathroom due to excessive clutter  PT Therapy Diagnosis  Generalized weakness  PT Plan  PT  Frequency Min 3X/week  PT  Treatment/Interventions Gait training;Functional mobility training;Therapeutic activities;Therapeutic exercise  PT Recommendation  Follow Up Recommendations Skilled nursing facility  Equipment Recommended Defer to next venue  Individuals Consulted  Consulted and Agree with Results and Recommendations Patient;Family member/caregiver  Family Member Consulted son and daughter  Acute Rehab PT Goals  PT Goal Formulation With patient/family  Time For Goal Achievement 06/26/11  Potential to Achieve Goals Good  Pt will go Supine/Side to Sit Independently;with HOB 0 degrees  PT Goal: Supine/Side to Sit - Progress Goal set today  Pt will go Sit to Supine/Side Independently;with HOB 0 degrees  PT Goal: Sit to Supine/Side - Progress Goal set today  Pt will go Sit to Stand with modified independence (up to RW with safe technique)  PT Goal: Sit to Stand - Progress Goal set today  Pt will Ambulate >150 feet;with modified independence;with rolling walker  PT Goal: Ambulate - Progress Goal set today  Written Expression  Dominant Hand Right    Pain: pt denies  Spoke with case management and social work re: d/c recommendations. Provided them with phone numbers of family members.  Lewis Shock, PT, DPT Pager #: [phone] Office #: [phone]

## 2011-06-19 NOTE — Progress Notes (Addendum)
Name: Joann [name] MRN: [id number] DOB: 01-Apr-1927    LOS: 4  PULMONARY / CRITICAL CARE MEDICINE  HPI: 78 year old female with no significant PMH except for heavy smoking of 60 packs year and dyslipidemia.  Presented on 5/19 with one to two days history of lower back pain, pelvic pain, fever and general malaise. At admission to the ER was found to be febrile and hypotensive. Her BP did not improve with one liter of NS. PCCM asked to admit.   Current Status:    - Awake, alert, oriented. Feeling better. - Off pressors x 25 but MAP 65 and above with sbp > 95 though baseline sbp 150 per patient  Vital Signs: Temp:  [97.4 F (36.3 C)-97.8 F (36.6 C)] 97.5 F (36.4 C) (05/22 0739) Pulse Rate:  [63-95] 65  (05/22 0959) Resp:  [10-26] 21  (05/22 0959) BP: (90-129)/(46-84) 129/58 mmHg (05/22 0959) SpO2:  [93 %-100 %] 95 % (05/22 0959) Weight:  [76.3 kg (168 lb 3.4 oz)] 76.3 kg (168 lb 3.4 oz) (05/22 0600)    . sodium chloride 100 mL/hr at 06/16/11 0551  . sodium chloride 75 mL/hr at 06/19/11 0800  . sodium chloride 20 mL/hr at 06/17/11 0259  . norepinephrine (LEVOPHED) Adult infusion Stopped (06/17/11 1700)    Physical Examination: General:  Pleasant female patient not in acute distress.  Neuro:  Awake, alert, oriented x 3. Non focal. HEENT:  PERRL, pink conjunctivae, dry mucous membranes Neck:  Supple, no JVD   Cardiovascular:  RRR, no M/R/G Lungs:  CTA Abdomen:  Soft, mildly tender on the lower abdomen, nondistended, bowel sounds present. Gyn: Not examined Musculoskeletal:  Moves all extremities, no pedal edema Skin:  No rash  Active problems: Sepsis  ASSESSMENT AND PLAN  PULMONARY  Lab 06/16/11 1804  PHART 7.353  PCO2ART 30.2*  PO2ART 81.0  HCO3 16.8*  O2SAT 96.0   CXR:  No acute infiltrates on X ray  A:Tachypnea - On 5/20 thrugh 5/22 - normal resp drive P:  -pulse ox while in ICU   CARDIOVASCULAR  Lab 06/18/11 0522 06/16/11 2000 06/15/11 2337 06/15/11 2318    TROPONINI -- -- -- <0.30  LATICACIDVEN 2.0 2.4* 2.0 --  PROBNP -- -- -- --    Lab 06/18/11 0521 06/17/11 0530 06/15/11 2319  PROCALCITON 17.42 20.09 13.07     ECG:  Pending Lines: CVL palced 06/17/11.  A: Tachycardia, SIRS/Sepsis.   - on 5/20 back on levophed  -on 5/21 -off pressors but bp spft and has cleared lactate but not clearingPCT P:  - MAP goal > 65 whicch she ha sbeen de novo for > 24h - dc stress dose steroids 5/22 (random cortisol 53 on 5/19, ? Sent after dose of steroids)  RENAL  Lab 06/19/11 0505 06/18/11 0521 06/17/11 0530 06/16/11 0612 06/15/11 2316  NA 135 -- 134* 133* 131*  K 3.2* -- 3.6 -- --  CL 106 -- 104 98 97  CO2 19 -- 16* 20 16*  BUN 25* -- 20 19 17   CREATININE 0.87 -- 0.83 1.17* 1.30*  CALCIUM 8.0* -- 8.0* 9.2 9.4  MG 2.0 1.7 -- 1.7 --  PHOS 2.8 2.5 -- 2.4 --   Intake/Output      05/21 0701 - 05/22 0700 05/22 0701 - 05/23 0700   P.O. 1160 200   I.V. (mL/kg) 1632.5 (21.4) 225 (2.9)   Other     IV Piggyback 1650 100   Total Intake(mL/kg) 4442.5 (58.2) 525 (6.9)   Urine (  mL/kg/hr) 1475 (0.8)    Stool 0 1   Total Output 1475 1   Net +2967.5 +524        Stool Occurrence 2 x 1 x    CT abd/pelvis 5/19: Symmetric renal size. Images through the lower right kidney are degraded by respiratory motion. Subcentimeter hypodensity within  the lower pole of the left kidney is favored to represent a cyst. No hydronephrosis or hydroureter  A:  -Acute renal failure, likely pre renal. Improved w/ IVFs.  And resolved to normal 5/20 - Mild hypokalemia 5/22  P:   1) Change IVF resuscitation to kvo on 06/19/2011 2) Will follow CMP in am 3) replete K on 06/19/2011   GASTROINTESTINAL  Lab 06/19/11 0505 06/17/11 0530 06/15/11 2316  AST 26 20 22   ALT 19 10 11   ALKPHOS 46 54 82  BILITOT 0.3 0.9 1.3*  PROT 4.9* 5.4* 7.1  ALBUMIN 1.8* 2.0* 3.2*  CT abd/pelvis 5/19: Stranding of the right mesorectal, perivaginal, and ischioanal fat, with extension  inferiorly to involve the right perineum and  posterior subcutaneous fat. Correlate with direct inspection. May  reflect an infectious process originating from the rectum or  vagina. No associated loculated fluid collection appreciated. A: P:   1) Regular diet,  2) PO protonix for GI prophylaxis See ID  HEMATOLOGIC  Lab 06/19/11 0505 06/17/11 0530 06/16/11 0612 06/15/11 2316  HGB 10.6* 11.7* 14.3 13.9  HCT 31.8* 35.6* 41.7 41.5  PLT 201 218 186 227  INR -- -- -- --  APTT -- -- -- --   A:  No evidence of bleeding P: - - PRBC for hgb </= 6.9gm%    - exceptions are   -  if ACS susepcted/confirmed then transfuse for hgb </= 8.0gm%,  or    -  If septic shock first 24h and scvo2 < 70% then transfuse for hgb </= 9.0gm%   - active bleeding with hemodynamic instability, then transfuse regardless of hemoglobin value   At at all times try to transfuse 1 unit prbc as possible with exception of active hemorrhage    INFECTIOUS  Lab 06/19/11 0505 06/18/11 0521 06/17/11 0530 06/16/11 0612 06/15/11 2319 06/15/11 2316  WBC 9.5 -- 13.3* 9.2 -- 14.1*  PROCALCITON -- 17.42 20.09 -- 13.07 --   Cultures: Results for orders placed during the hospital encounter of 06/15/11  CULTURE, BLOOD (ROUTINE X 2)     Status: Normal (Preliminary result)   Collection Time   06/15/11 11:30 PM      Component Value Range Status Comment   Specimen Description BLOOD RIGHT HAND   Final    Special Requests BOTTLES DRAWN AEROBIC AND ANAEROBIC 5CC EACH   Final    Culture  Setup Time [id number]   Final    Culture     Final    Value:        BLOOD CULTURE RECEIVED NO GROWTH TO DATE CULTURE WILL BE HELD FOR 5 DAYS BEFORE ISSUING A FINAL NEGATIVE REPORT   Report Status PENDING   Incomplete   CULTURE, BLOOD (ROUTINE X 2)     Status: Normal (Preliminary result)   Collection Time   06/15/11 11:35 PM      Component Value Range Status Comment   Specimen Description BLOOD RIGHT HAND   Final    Special Requests BOTTLES  DRAWN AEROBIC AND ANAEROBIC [facility]   Final    Culture  Setup Time [id number]   Final    Culture  Final    Value:        BLOOD CULTURE RECEIVED NO GROWTH TO DATE CULTURE WILL BE HELD FOR 5 DAYS BEFORE ISSUING A FINAL NEGATIVE REPORT   Report Status PENDING   Incomplete   MRSA PCR SCREENING     Status: Abnormal   Collection Time   06/16/11  2:12 AM      Component Value Range Status Comment   MRSA by PCR POSITIVE (*) NEGATIVE  Final   URINE CULTURE     Status: Normal   Collection Time   06/16/11  5:38 AM      Component Value Range Status Comment   Specimen Description URINE, CLEAN CATCH   Final    Special Requests NONE   Final    Culture  Setup Time [id number]   Final    Colony Count 4,000 COLONIES/ML   Final    Culture INSIGNIFICANT GROWTH   Final    Report Status 06/17/2011 FINAL   Final   ANAEROBIC CULTURE     Status: Normal (Preliminary result)   Collection Time   06/16/11  6:59 PM      Component Value Range Status Comment   Specimen Description ABSCESS LABIA   Final    Special Requests NONE   Final    Gram Stain     Final    Value: RARE WBC PRESENT,BOTH PMN AND MONONUCLEAR     RARE SQUAMOUS EPITHELIAL CELLS PRESENT     RARE GRAM POSITIVE COCCI IN PAIRS   Culture     Final    Value: NO ANAEROBES ISOLATED; CULTURE IN PROGRESS FOR 5 DAYS   Report Status PENDING   Incomplete   CULTURE, ROUTINE-ABSCESS     Status: Normal   Collection Time   06/16/11  6:59 PM      Component Value Range Status Comment   Specimen Description ABSCESS LABIA   Final    Special Requests NONE   Final    Gram Stain     Final    Value: RARE WBC PRESENT,BOTH PMN AND MONONUCLEAR     RARE SQUAMOUS EPITHELIAL CELLS PRESENT     RARE GRAM POSITIVE COCCI IN PAIRS   Culture     Final    Value: MODERATE METHICILLIN RESISTANT STAPHYLOCOCCUS AUREUS     Note: RIFAMPIN AND GENTAMICIN SHOULD NOT BE USED AS SINGLE DRUGS FOR TREATMENT OF STAPH INFECTIONS. This organism DOES NOT demonstrate inducible Clindamycin  resistance in vitro. CRITICAL RESULT CALLED TO, READ BACK BY AND VERIFIED WITH: Aida Puffer      06/19/11 0810 BY [email or web address]   Report Status 06/19/2011 FINAL   Final    Organism ID, Bacteria METHICILLIN RESISTANT STAPHYLOCOCCUS AUREUS   Final       Antibiotics: Anti-infectives     Start     Dose/Rate Route Frequency Ordered Stop   06/16/11 2200   vancomycin (VANCOCIN) 750 mg in sodium chloride 0.9 % 150 mL IVPB        750 mg 150 mL/hr over 60 Minutes Intravenous Daily at bedtime 06/16/11 0327     06/16/11 1800   clindamycin (CLEOCIN) IVPB 600 mg  Status:  Discontinued        600 mg 100 mL/hr over 30 Minutes Intravenous 4 times per day 06/16/11 1652 06/16/11 1700   06/16/11 1715   clindamycin (CLEOCIN) IVPB 900 mg        900 mg 100 mL/hr over 30 Minutes Intravenous 3 times per day 06/16/11 1700  06/16/11 0800  piperacillin-tazobactam (ZOSYN) IVPB 3.375 g       3.375 g 12.5 mL/hr over 240 Minutes Intravenous 3 times per day 06/16/11 0327     06/16/11 0115   piperacillin-tazobactam (ZOSYN) IVPB 3.375 g  Status:  Discontinued        3.375 g 100 mL/hr over 30 Minutes Intravenous  Once 06/16/11 0113 06/16/11 0140   06/16/11 0115   vancomycin (VANCOCIN) IVPB 1000 mg/200 mL premix  Status:  Discontinued        1,000 mg 200 mL/hr over 60 Minutes Intravenous  Once 06/16/11 0113 06/16/11 0140   06/16/11 0015   vancomycin (VANCOCIN) IVPB 1000 mg/200 mL premix        1,000 mg 200 mL/hr over 60 Minutes Intravenous  Once 06/16/11 0002 06/16/11 0210   06/16/11 0015  piperacillin-tazobactam (ZOSYN) IVPB 3.375 g       3.375 g 12.5 mL/hr over 240 Minutes Intravenous  Once 06/16/11 0002 06/16/11 0436            Lab 06/15/11 2318  TROPONINI <0.30       A: Large red indurated fluctuant area encompassing the right perirectal and perineal space. S/p surgery debridement 5/19. Due to MRSA -On 5/19: PCCM MD exam - pelvis c/w abscess ( this is the source) / s/p surgery  - on 5/21-  fevers resolved with surgery and antibotic. Off pressors but PCT still hight - on 5/22 stil off pressors    P:   1) Antibiotics per flow sheet 2)  per [facility] 3) DC zosyn (no evidence of pseudomonas) 4) Continue vanc and clinda but on 5/23 need input from [facility] if there is need for both in evidence of MRSA pelvic abscess 5)  Recheck PCT on 5/24 (72h since 5/21) and decide on setting stop date  ENDOCRINE No results found for this basename: GLUCAP:5 in the last 168 hours A:   -No history of DM  - cortisol then stress steroid snow  NEUROLOGIC  A:  Pain control, r/o septic enceph  -on 5/20 - normal mental status.  P: monitor abg  BEST PRACTICE / DISPOSITION - Level of Care:  ICU -> regular floor - Primary Service:  PCCM -> triad on 5/23 (dw [place] [facility] of triad) - Consultants:  None - Code Status:  Full - Diet:  Regular - DVT Px:  Heparin - GI Px:  Protonix - Skin Integrity:  Intact. - Social / Family:  Family informed at bedside on 5/19 by Dr Tyson Alias. No family at bedside 5/20. Son at bedside 5/21. No family 5/22    Dr. Kalman Shan, M.D., [facility].C.P Pulmonary and Critical Care Medicine Staff Physician [facility] [place] [facility] Pager: [phone], If no answer or between  15:00h - 7:00h: call [phone]  06/19/2011 10:07 AM

## 2011-06-19 NOTE — Clinical Social Work Psychosocial (Signed)
     Clinical Social Work Department BRIEF PSYCHOSOCIAL ASSESSMENT 06/19/2011  Patient:  Joann [name],Joann [name]     Account Number:  [id number]     Admit date:  06/15/2011  Clinical Social Worker:  Dennison Bulla  Date/Time:  06/19/2011 03:30 PM  Referred by:  Physician  Date Referred:  06/19/2011 Referred for  SNF Placement   Other Referral:   Interview type:  Patient Other interview type:   Dtr present-[name]    PSYCHOSOCIAL DATA Living Status:  FRIEND(S) Admitted from facility:   Level of care:   Primary support name:  Joann [name] Primary support relationship to patient:  CHILD, ADULT Degree of support available:   Strong    [phone] or [phone]  [phone] or [phone]    CURRENT CONCERNS Current Concerns  Post-Acute Placement   Other Concerns:    SOCIAL WORK ASSESSMENT / PLAN CSW received referral for SNF for patient. PT spoke with CSW and reported that patient and family were interested in SNF.    CSW met with patient and dtr at bedside. Son was leaving the room as CSW was entering. Dtr agreed to update him on plan. CSW introduced myself and explained role. Patient reported she lives at home with a friend but is understanding that SNF is needed. Patient reports she has never been to SNF but has visited friends and is interested in [facility]. CSW provided family with SNF list and encouraged family to choose alternative options in case [facility] did not have available beds.    CSW submitted level 1 pasarr and completed FL2 and faxed out. CSW will follow up with bed offers.   Assessment/plan status:  Psychosocial Support/Ongoing Assessment of Needs Other assessment/ plan:   Information/referral to community resources:   SNF list    PATIENTS/FAMILYS RESPONSE TO PLAN OF CARE: Patient was alert and oriented. Patient and family is receptive to SNF placement and prefer [facility]. Patient prefers to return home after SNF. Patient agreeable  to dtr and son being involved in dc plans.

## 2011-06-19 NOTE — Clinical Social Work Placement (Addendum)
    Clinical Social Work Department CLINICAL SOCIAL WORK PLACEMENT NOTE 06/19/2011  Patient:  Joann [name],Joann [name]  Account Number:  [id number] Admit date:  06/15/2011  Clinical Social Worker:  Unk Lightning, LCSW  Date/time:  06/19/2011 03:30 PM  Clinical Social Work is seeking post-discharge placement for this patient at the following level of care:   SKILLED NURSING   (*CSW will update this form in Epic as items are completed)   06/19/2011  Patient/family provided with [facility] Department of Clinical Social Work's list of facilities offering this level of care within the geographic area requested by the patient (or if unable, by the patient's family).  06/19/2011  Patient/family informed of their freedom to choose among providers that offer the needed level of care, that participate in Medicare, Medicaid or managed care program needed by the patient, have an available bed and are willing to accept the patient.  06/19/2011  Patient/family informed of [facility]' ownership interest in [facility], as well as of the fact that they are under no obligation to receive care at this facility.  PASARR submitted to EDS on 06/19/2011 PASARR number received from EDS on 06/19/11 (JB)  FL2 transmitted to all facilities in geographic area requested by pt/family on  06/19/2011 FL2 transmitted to all facilities within larger geographic area on   Patient informed that his/her managed care company has contracts with or will negotiate with  certain facilities, including the following:     Patient/family informed of bed offers received:  06/21/2011 (JS) Patient chooses bed at  [facility] (JS) Physician recommends and patient chooses bed at    Patient to be transferred to  [facility] on  06/22/2011  (JS) Patient to be transferred to facility by Sharin Mons Dorma Russell)  The following physician request were entered in Epic:   Additional Comments:

## 2011-06-19 NOTE — Progress Notes (Signed)
ANTIBIOTIC CONSULT NOTE - FOLLOW UP   Pharmacy Consult:  Vancomycin / Zosyn / Clindamycin Indication: rule out sepsis, toxic shock  Allergies  Allergen Reactions  . Sulfonamide Derivatives     REACTION: unspecified    Patient Measurements: Height: 5\' 7"  (170.2 cm) Weight: 168 lb 3.4 oz (76.3 kg) IBW/kg (Calculated) : 61.6    Vital Signs: Temp: 97.5 F (36.4 C) (05/22 0739) Temp src: Oral (05/22 0739) BP: 101/59 mmHg (05/22 0800) Pulse Rate: 84  (05/22 0800)  Labs:  Basename 06/19/11 0505 06/17/11 0530  WBC 9.5 13.3*  HGB 10.6* 11.7*  PLT 201 218  LABCREA -- --  CREATININE 0.87 0.83   Estimated Creatinine Clearance: 54 ml/min (by C-G formula based on Cr of 0.87).    Assessment: 83 YOF started on empiric vanc and Zosyn for sepsis.  Patient found to have right labial abscess and clindamycin was then added for toxin inhibition.  Patient's AKI is resolving and PCT / WBC are trending down.   Goal of Therapy: Vanc trough 15-20 mcg/mL   Plan:  - Vanc 750mg  IV Q24H - Clinda 900mg  IV Q8H - Zosyn 3.375gm IV Q8H (4 hr infusion) - Monitor renal fxn, clinical course, vanc trough tonight     Dennis Killilea D. Laney Potash, PharmD, BCPS Pager:  [phone] 06/19/2011, 9:51 AM

## 2011-06-19 NOTE — Progress Notes (Signed)
eLink Physician-Brief Progress Note Patient Name: Joann [name] DOB: [birth year] MRN: [id number]  Date of Service  06/19/2011   HPI/Events of Note  hypokalemia  eICU Interventions  Potassium replaced   Intervention Category Intermediate Interventions: Electrolyte abnormality - evaluation and management  [name] 06/19/2011, 6:11 AM

## 2011-06-19 NOTE — Progress Notes (Signed)
Patient ID: Joann [name], female   DOB: 06-Jun-1927, 78 y.o.   MRN: [id number] 3 Days Post-Op  Subjective: Pt without complaints  Objective: Vital signs in last 24 hours: Temp:  [97.4 F (36.3 C)-97.8 F (36.6 C)] 97.5 F (36.4 C) (05/22 0739) Pulse Rate:  [63-95] 65  (05/22 0959) Resp:  [10-26] 21  (05/22 0959) BP: (90-129)/(47-84) 129/58 mmHg (05/22 0959) SpO2:  [93 %-100 %] 95 % (05/22 0959) Weight:  [168 lb 3.4 oz (76.3 kg)] 168 lb 3.4 oz (76.3 kg) (05/22 0600) Last BM Date: 06/19/11  Intake/Output from previous day: 05/21 0701 - 05/22 0700 In: 4442.5 [P.O.:1160; I.V.:1632.5; IV Piggyback:1650] Out: 1475 [Urine:1475] Intake/Output this shift: Total I/O In: 525 [P.O.:200; I.V.:225; IV Piggyback:100] Out: 1 [Stool:1]  PE: Abd: sitting up in chair, unable to evaluate wound at this time.  Will look at it tomorrow.  Lab Results:   Basename 06/19/11 0505 06/17/11 0530  WBC 9.5 13.3*  HGB 10.6* 11.7*  HCT 31.8* 35.6*  PLT 201 218   BMET  Basename 06/19/11 0505 06/17/11 0530  NA 135 134*  K 3.2* 3.6  CL 106 104  CO2 19 16*  GLUCOSE 112* 151*  BUN 25* 20  CREATININE 0.87 0.83  CALCIUM 8.0* 8.0*   PT/INR No results found for this basename: LABPROT:2,INR:2 in the last 72 hours CMP     Component Value Date/Time   NA 135 06/19/2011 0505   K 3.2* 06/19/2011 0505   CL 106 06/19/2011 0505   CO2 19 06/19/2011 0505   GLUCOSE 112* 06/19/2011 0505   BUN 25* 06/19/2011 0505   CREATININE 0.87 06/19/2011 0505   CREATININE 0.84 03/07/2011 1016   CALCIUM 8.0* 06/19/2011 0505   PROT 4.9* 06/19/2011 0505   ALBUMIN 1.8* 06/19/2011 0505   AST 26 06/19/2011 0505   ALT 19 06/19/2011 0505   ALKPHOS 46 06/19/2011 0505   BILITOT 0.3 06/19/2011 0505   GFRNONAA 61* 06/19/2011 0505   GFRAA 70* 06/19/2011 0505   Lipase  No results found for this basename: lipase       Studies/Results: No results found.  Anti-infectives: Anti-infectives     Start     Dose/Rate Route Frequency Ordered Stop     06/16/11 2200   vancomycin (VANCOCIN) 750 mg in sodium chloride 0.9 % 150 mL IVPB        750 mg 150 mL/hr over 60 Minutes Intravenous Daily at bedtime 06/16/11 0327     06/16/11 1800   clindamycin (CLEOCIN) IVPB 600 mg  Status:  Discontinued        600 mg 100 mL/hr over 30 Minutes Intravenous 4 times per day 06/16/11 1652 06/16/11 1700   06/16/11 1715   clindamycin (CLEOCIN) IVPB 900 mg        900 mg 100 mL/hr over 30 Minutes Intravenous 3 times per day 06/16/11 1700     06/16/11 0800   piperacillin-tazobactam (ZOSYN) IVPB 3.375 g  Status:  Discontinued        3.375 g 12.5 mL/hr over 240 Minutes Intravenous 3 times per day 06/16/11 0327 06/19/11 1018   06/16/11 0115   piperacillin-tazobactam (ZOSYN) IVPB 3.375 g  Status:  Discontinued        3.375 g 100 mL/hr over 30 Minutes Intravenous  Once 06/16/11 0113 06/16/11 0140   06/16/11 0115   vancomycin (VANCOCIN) IVPB 1000 mg/200 mL premix  Status:  Discontinued        1,000 mg 200 mL/hr over 60 Minutes Intravenous  Once 06/16/11 0113 06/16/11 0140   06/16/11 0015   vancomycin (VANCOCIN) IVPB 1000 mg/200 mL premix        1,000 mg 200 mL/hr over 60 Minutes Intravenous  Once 06/16/11 0002 06/16/11 0210   06/16/11 0015  piperacillin-tazobactam (ZOSYN) IVPB 3.375 g       3.375 g 12.5 mL/hr over 240 Minutes Intravenous  Once 06/16/11 0002 06/16/11 0436           Assessment/Plan  1. S/p I&D of right labial abscess  Plan: 1. Cx are MRSA +.  Currently on vanc and clinda.  Her WBC is normal and she is AF.  Her wound has been clean.  I will d/w Dr. Daphine [name] abx regimen, but can likely switch to po abx therapy such as doxy or bactrim.   2. Cont current dressing care.   LOS: 4 days    Joann [name] E 06/19/2011

## 2011-06-20 DIAGNOSIS — E876 Hypokalemia: Secondary | ICD-10-CM

## 2011-06-20 DIAGNOSIS — N731 Chronic parametritis and pelvic cellulitis: Secondary | ICD-10-CM

## 2011-06-20 DIAGNOSIS — A419 Sepsis, unspecified organism: Secondary | ICD-10-CM

## 2011-06-20 DIAGNOSIS — D510 Vitamin B12 deficiency anemia due to intrinsic factor deficiency: Secondary | ICD-10-CM

## 2011-06-20 LAB — BASIC METABOLIC PANEL
BUN: 21 mg/dL (ref 6–23)
CO2: 22 mEq/L (ref 19–32)
Calcium: 8 mg/dL — ABNORMAL LOW (ref 8.4–10.5)
Chloride: 108 mEq/L (ref 96–112)
Creatinine, Ser: 0.79 mg/dL (ref 0.50–1.10)
GFR calc Af Amer: 88 mL/min — ABNORMAL LOW (ref >90)
GFR calc non Af Amer: 76 mL/min — ABNORMAL LOW (ref >90)
Glucose, Bld: 70 mg/dL (ref 70–99)
Potassium: 3.2 mEq/L — ABNORMAL LOW (ref 3.5–5.1)
Sodium: 138 mEq/L (ref 135–145)

## 2011-06-20 MED ORDER — POTASSIUM CHLORIDE CRYS ER 20 MEQ PO TBCR
40.0000 meq | EXTENDED_RELEASE_TABLET | Freq: Two times a day (BID) | ORAL | Status: AC
  Administered 2011-06-20 (×2): 20 meq via ORAL
  Administered 2011-06-20 – 2011-06-21 (×2): 40 meq via ORAL
  Filled 2011-06-20: qty 1
  Filled 2011-06-20 (×3): qty 2
  Filled 2011-06-20: qty 1

## 2011-06-20 MED ORDER — HEPARIN SODIUM (PORCINE) 5000 UNIT/ML IJ SOLN
5000.0000 [IU] | Freq: Three times a day (TID) | INTRAMUSCULAR | Status: DC
  Administered 2011-06-20 – 2011-06-22 (×6): 5000 [IU] via SUBCUTANEOUS
  Filled 2011-06-20 (×10): qty 1

## 2011-06-20 MED ORDER — VANCOMYCIN HCL 1000 MG IV SOLR
750.0000 mg | Freq: Two times a day (BID) | INTRAVENOUS | Status: AC
  Administered 2011-06-20 – 2011-06-21 (×4): 750 mg via INTRAVENOUS
  Filled 2011-06-20 (×5): qty 750

## 2011-06-20 NOTE — Progress Notes (Signed)
Patient ID: Joann [name], female   DOB: Jan 15, 1928, 78 y.o.   MRN: [id number] Transferred to [id number].  Will continue labial dressing changes.  Doing well.

## 2011-06-20 NOTE — Progress Notes (Signed)
[facility] Progress Note  06/20/2011  Pt was picked up from [facility] service.  78 year old female with no significant PMH except for heavy smoking of 60 packs year and dyslipidemia. Presented on 5/19 with one to two days history of lower back pain, pelvic pain, fever and general malaise. At admission to the ER was found to be febrile and hypotensive. Pt was treated in ICU for sepsis.  Pt was on pressor support for several days.  CCS performed I&D of large right  Labial abscess (cultures positive for MRSA) and pt on vancomycin IV.  Pt has since stabilized and improved and sent to floor.  CSW working on SNF placement.    Subjective: Pt eating and drinking well. Says she feels better.  No fever or chills.    Objective:  Vital signs in last 24 hours: Filed Vitals:   06/19/11 1200 06/19/11 1232 06/19/11 2049 06/20/11 0556  BP: 112/63  98/55 101/55  Pulse: 67  67 81  Temp:  97.4 F (36.3 C) 97.7 F (36.5 C) 98 F (36.7 C)  TempSrc:  Oral Oral Oral  Resp: 24  20 20   Height:      Weight:      SpO2: 99%  95% 93%   Weight change:   Intake/Output Summary (Last 24 hours) at 06/20/11 1237 Last data filed at 06/20/11 0600  Gross per 24 hour  Intake    120 ml  Output   1400 ml  Net  -1280 ml   No results found for this basename: HGBA1C   Lab Results  Component Value Date   LDLCALC 121* 12/14/2008   CREATININE 0.79 06/20/2011    Review of Systems As above, otherwise all reviewed and reported negative  Physical Exam General: Pleasant female patient not in acute distress.  HEENT: PERRL, pink conjunctivae, dry mucous membranes  Neck: Supple, no JVD  Cardiovascular: RRR, no M/R/G  Lungs: CTA  Abdomen: Soft, mildly tender on the lower abdomen, nondistended, bowel sounds present.  Neuro: Awake, alert, oriented x 3. Non focal.  Musculoskeletal: Moves all extremities, no pedal edema  Skin: No rash  Lab Results: Results for orders placed during the hospital encounter of 06/15/11  (from the past 24 hour(s))  VANCOMYCIN, TROUGH     Status: Abnormal   Collection Time   06/19/11  9:30 PM      Component Value Range   Vancomycin Tr 7.0 (*) 10.0 - 20.0 (ug/mL)  BASIC METABOLIC PANEL     Status: Abnormal   Collection Time   06/20/11  6:11 AM      Component Value Range   Sodium 138  135 - 145 (mEq/L)   Potassium 3.2 (*) 3.5 - 5.1 (mEq/L)   Chloride 108  96 - 112 (mEq/L)   CO2 22  19 - 32 (mEq/L)   Glucose, Bld 70  70 - 99 (mg/dL)   BUN 21  6 - 23 (mg/dL)   Creatinine, Ser 7.84  0.50 - 1.10 (mg/dL)   Calcium 8.0 (*) 8.4 - 10.5 (mg/dL)   GFR calc non Af Amer 76 (*) >90 (mL/min)   GFR calc Af Amer 88 (*) >90 (mL/min)    Micro Results: Recent Results (from the past 240 hour(s))  CULTURE, BLOOD (ROUTINE X 2)     Status: Normal (Preliminary result)   Collection Time   06/15/11 11:30 PM      Component Value Range Status Comment   Specimen Description BLOOD RIGHT HAND   Final  Special Requests BOTTLES DRAWN AEROBIC AND ANAEROBIC [facility] EACH   Final    Culture  Setup Time [id number]   Final    Culture     Final    Value:        BLOOD CULTURE RECEIVED NO GROWTH TO DATE CULTURE WILL BE HELD FOR 5 DAYS BEFORE ISSUING A FINAL NEGATIVE REPORT   Report Status PENDING   Incomplete   CULTURE, BLOOD (ROUTINE X 2)     Status: Normal (Preliminary result)   Collection Time   06/15/11 11:35 PM      Component Value Range Status Comment   Specimen Description BLOOD RIGHT HAND   Final    Special Requests BOTTLES DRAWN AEROBIC AND ANAEROBIC [facility] EACH   Final    Culture  Setup Time [id number]   Final    Culture     Final    Value:        BLOOD CULTURE RECEIVED NO GROWTH TO DATE CULTURE WILL BE HELD FOR 5 DAYS BEFORE ISSUING A FINAL NEGATIVE REPORT   Report Status PENDING   Incomplete   MRSA PCR SCREENING     Status: Abnormal   Collection Time   06/16/11  2:12 AM      Component Value Range Status Comment   MRSA by PCR POSITIVE (*) NEGATIVE  Final   URINE CULTURE     Status: Normal     Collection Time   06/16/11  5:38 AM      Component Value Range Status Comment   Specimen Description URINE, CLEAN CATCH   Final    Special Requests NONE   Final    Culture  Setup Time [id number]   Final    Colony Count 4,000 COLONIES/ML   Final    Culture INSIGNIFICANT GROWTH   Final    Report Status 06/17/2011 FINAL   Final   ANAEROBIC CULTURE     Status: Normal (Preliminary result)   Collection Time   06/16/11  6:59 PM      Component Value Range Status Comment   Specimen Description ABSCESS LABIA   Final    Special Requests NONE   Final    Gram Stain     Final    Value: RARE WBC PRESENT,BOTH PMN AND MONONUCLEAR     RARE SQUAMOUS EPITHELIAL CELLS PRESENT     RARE GRAM POSITIVE COCCI IN PAIRS   Culture     Final    Value: NO ANAEROBES ISOLATED; CULTURE IN PROGRESS FOR 5 DAYS   Report Status PENDING   Incomplete   CULTURE, ROUTINE-ABSCESS     Status: Normal   Collection Time   06/16/11  6:59 PM      Component Value Range Status Comment   Specimen Description ABSCESS LABIA   Final    Special Requests NONE   Final    Gram Stain     Final    Value: RARE WBC PRESENT,BOTH PMN AND MONONUCLEAR     RARE SQUAMOUS EPITHELIAL CELLS PRESENT     RARE GRAM POSITIVE COCCI IN PAIRS   Culture     Final    Value: MODERATE METHICILLIN RESISTANT STAPHYLOCOCCUS AUREUS     Note: RIFAMPIN AND GENTAMICIN SHOULD NOT BE USED AS SINGLE DRUGS FOR TREATMENT OF STAPH INFECTIONS. This organism DOES NOT demonstrate inducible Clindamycin resistance in vitro. CRITICAL RESULT CALLED TO, READ BACK BY AND VERIFIED WITH: Aida Puffer      06/19/11 0810 BY [email or web address]   Report Status 06/19/2011 FINAL  Final    Organism ID, Bacteria METHICILLIN RESISTANT STAPHYLOCOCCUS AUREUS   Final     Medications:  Scheduled Meds:   . Chlorhexidine Gluconate Cloth  6 each Topical Q0600  . clindamycin (CLEOCIN) IV  900 mg Intravenous Q8H  . mupirocin ointment  1 application Nasal BID  . pantoprazole  20 mg Oral Q1200  .  potassium chloride  40 mEq Oral BID  . vancomycin  750 mg Intravenous Q12H  . DISCONTD: vancomycin  750 mg Intravenous QHS   Continuous Infusions:   . sodium chloride 20 mL/hr at 06/19/11 1200   PRN Meds:.acetaminophen, droperidol, HYDROmorphone (DILAUDID) injection, sodium chloride  Assessment/Plan: Right labial MRSA Abscess s/p I&D - continue IV vancomycin for now, may d/c clindamycin, mgmt per [facility] team.  Pt clinically improving.  Sepsis - resolved now, off pressors Hypokalemia - replace orally today. See orders, follow, check Mg ARF - likely prerenal - improved with IVFs.  Anemia - Hg holding stable, following DVT prophylaxis - heparin GI prophylaxis - protonix Dispo - CSW working on SNF placement.  No family at bedside today   LOS: 5 days   Joann [name] 06/20/2011, 12:37 PM   Cleora Fleet, MD, CDE, FAAFP [facility] [place], [place]  [phone]

## 2011-06-20 NOTE — Progress Notes (Signed)
ANTIBIOTIC CONSULT NOTE   Pharmacy Consult:  Vancomycin  Indication: MRSA pelvic wound  Allergies  Allergen Reactions  . Sulfonamide Derivatives     REACTION: unspecified    Patient Measurements: Height: 5\' 7"  (170.2 cm) Weight: 168 lb 3.4 oz (76.3 kg) IBW/kg (Calculated) : 61.6    Vital Signs: Temp: 97.7 F (36.5 C) (05/22 2049) Temp src: Oral (05/22 2049) BP: 98/55 mmHg (05/22 2049) Pulse Rate: 67  (05/22 2049)  Labs:  Basename 06/19/11 0505 06/17/11 0530  WBC 9.5 13.3*  HGB 10.6* 11.7*  PLT 201 218  LABCREA -- --  CREATININE 0.87 0.83   Estimated Creatinine Clearance: 54 ml/min (by C-G formula based on Cr of 0.87).  Vancomycin trough 7.0  Assessment: 78 YO female with sepsis, MRSA labial abcess for Vancomycin.  Goal of Therapy: Vanc trough 15-20 mcg/mL   Plan:  Change vancomycin 750 mg IV q12h  Geannie Risen, PharmD, BCPS 06/20/2011, 2:24 AM

## 2011-06-20 NOTE — Progress Notes (Signed)
Nurse called to report pt has urine leaking from around foley catheter and wants to try larger french catheter.  Will try today.  Continue foley as pt has perineal wound healing.  Maryln Manuel, MD

## 2011-06-20 NOTE — Progress Notes (Signed)
The drainage on pt's bed & gown is actually coming from surgical site, not foley catheter.  Changing foley catheter is not indicated at this time

## 2011-06-21 DIAGNOSIS — E876 Hypokalemia: Secondary | ICD-10-CM

## 2011-06-21 DIAGNOSIS — A419 Sepsis, unspecified organism: Secondary | ICD-10-CM

## 2011-06-21 DIAGNOSIS — D510 Vitamin B12 deficiency anemia due to intrinsic factor deficiency: Secondary | ICD-10-CM

## 2011-06-21 DIAGNOSIS — Z22322 Carrier or suspected carrier of Methicillin resistant Staphylococcus aureus: Secondary | ICD-10-CM

## 2011-06-21 DIAGNOSIS — M7989 Other specified soft tissue disorders: Secondary | ICD-10-CM

## 2011-06-21 DIAGNOSIS — N731 Chronic parametritis and pelvic cellulitis: Secondary | ICD-10-CM

## 2011-06-21 HISTORY — DX: Carrier or suspected carrier of methicillin resistant Staphylococcus aureus: Z22.322

## 2011-06-21 LAB — COMPREHENSIVE METABOLIC PANEL
ALT: 23 U/L (ref 0–35)
AST: 23 U/L (ref 0–37)
Albumin: 1.7 g/dL — ABNORMAL LOW (ref 3.5–5.2)
Alkaline Phosphatase: 49 U/L (ref 39–117)
BUN: 12 mg/dL (ref 6–23)
CO2: 23 mEq/L (ref 19–32)
Calcium: 7.9 mg/dL — ABNORMAL LOW (ref 8.4–10.5)
Chloride: 108 mEq/L (ref 96–112)
Creatinine, Ser: 0.69 mg/dL (ref 0.50–1.10)
GFR calc Af Amer: >90 mL/min (ref >90)
GFR calc non Af Amer: 79 mL/min — ABNORMAL LOW (ref >90)
Glucose, Bld: 82 mg/dL (ref 70–99)
Potassium: 3.8 mEq/L (ref 3.5–5.1)
Sodium: 139 mEq/L (ref 135–145)
Total Bilirubin: 0.3 mg/dL (ref 0.3–1.2)
Total Protein: 4.5 g/dL — ABNORMAL LOW (ref 6.0–8.3)

## 2011-06-21 LAB — CBC
HCT: 33.4 % — ABNORMAL LOW (ref 36.0–46.0)
Hemoglobin: 11.1 g/dL — ABNORMAL LOW (ref 12.0–15.0)
MCH: 28 pg (ref 26.0–34.0)
MCHC: 33.2 g/dL (ref 30.0–36.0)
MCV: 84.1 fL (ref 78.0–100.0)
Platelets: 262 10*3/uL (ref 150–400)
RBC: 3.97 MIL/uL (ref 3.87–5.11)
RDW: 13.1 % (ref 11.5–15.5)
WBC: 8.9 10*3/uL (ref 4.0–10.5)

## 2011-06-21 LAB — ANAEROBIC CULTURE

## 2011-06-21 LAB — MAGNESIUM: Magnesium: 1.6 mg/dL (ref 1.5–2.5)

## 2011-06-21 LAB — PROCALCITONIN: Procalcitonin: 4.14 ng/mL

## 2011-06-21 MED ORDER — DOXYCYCLINE HYCLATE 100 MG PO TABS
100.0000 mg | ORAL_TABLET | Freq: Two times a day (BID) | ORAL | Status: DC
  Administered 2011-06-22: 100 mg via ORAL
  Filled 2011-06-21 (×2): qty 1

## 2011-06-21 MED ORDER — POTASSIUM CHLORIDE CRYS ER 20 MEQ PO TBCR
EXTENDED_RELEASE_TABLET | ORAL | Status: AC
  Filled 2011-06-21: qty 2

## 2011-06-21 NOTE — Progress Notes (Signed)
[facility] Progress Note  06/21/2011   Subjective: Pt says she is feeling much better.  Pt denies complaints.  Eating well.    Objective:  Vital signs in last 24 hours: Filed Vitals:   06/20/11 1400 06/20/11 2135 06/21/11 0534 06/21/11 1300  BP: 111/70 131/83 124/74 110/62  Pulse: 72 67 89 100  Temp: 97.6 F (36.4 C) 97.9 F (36.6 C) 98 F (36.7 C) 97.6 F (36.4 C)  TempSrc:  Oral    Resp: 20 20 18 20   Height:      Weight:      SpO2: 95% 95% 98% 95%   Weight change:   Intake/Output Summary (Last 24 hours) at 06/21/11 1453 Last data filed at 06/21/11 1300  Gross per 24 hour  Intake    846 ml  Output   3650 ml  Net  -2804 ml   No results found for this basename: HGBA1C   Lab Results  Component Value Date   LDLCALC 121* 12/14/2008   CREATININE 0.69 06/21/2011    Review of Systems As above, otherwise all reviewed and reported negative  Physical Exam General - awake, no distress, cooperative HEENT - NCAT, MMM Lungs - BBS, CTA CV - normal s1, s2 sounds Abd - soft, nondistended, no masses, nontender Ext - no C/C/E  Lab Results: Results for orders placed during the hospital encounter of 06/15/11 (from the past 24 hour(s))  CBC     Status: Abnormal   Collection Time   06/21/11  5:45 AM      Component Value Range   WBC 8.9  4.0 - 10.5 (K/uL)   RBC 3.97  3.87 - 5.11 (MIL/uL)   Hemoglobin 11.1 (*) 12.0 - 15.0 (g/dL)   HCT 66.4 (*) 40.3 - 46.0 (%)   MCV 84.1  78.0 - 100.0 (fL)   MCH 28.0  26.0 - 34.0 (pg)   MCHC 33.2  30.0 - 36.0 (g/dL)   RDW 47.4  25.9 - 56.3 (%)   Platelets 262  150 - 400 (K/uL)  COMPREHENSIVE METABOLIC PANEL     Status: Abnormal   Collection Time   06/21/11  5:45 AM      Component Value Range   Sodium 139  135 - 145 (mEq/L)   Potassium 3.8  3.5 - 5.1 (mEq/L)   Chloride 108  96 - 112 (mEq/L)   CO2 23  19 - 32 (mEq/L)   Glucose, Bld 82  70 - 99 (mg/dL)   BUN 12  6 - 23 (mg/dL)   Creatinine, Ser 8.75  0.50 - 1.10 (mg/dL)   Calcium 7.9 (*) 8.4 - 10.5 (mg/dL)   Total Protein 4.5 (*) 6.0 - 8.3 (g/dL)   Albumin 1.7 (*) 3.5 - 5.2 (g/dL)   AST 23  0 - 37 (U/L)   ALT 23  0 - 35 (U/L)   Alkaline Phosphatase 49  39 - 117 (U/L)   Total Bilirubin 0.3  0.3 - 1.2 (mg/dL)   GFR calc non Af Amer 79 (*) >90 (mL/min)   GFR calc Af Amer >90  >90 (mL/min)  MAGNESIUM     Status: Normal   Collection Time   06/21/11  5:45 AM      Component Value Range   Magnesium 1.6  1.5 - 2.5 (mg/dL)  PROCALCITONIN     Status: Normal   Collection Time   06/21/11  5:45 AM      Component Value Range   Procalcitonin 4.14      Micro  Results: Recent Results (from the past 240 hour(s))  CULTURE, BLOOD (ROUTINE X 2)     Status: Normal (Preliminary result)   Collection Time   06/15/11 11:30 PM      Component Value Range Status Comment   Specimen Description BLOOD RIGHT HAND   Final    Special Requests BOTTLES DRAWN AEROBIC AND ANAEROBIC 5CC EACH   Final    Culture  Setup Time [id number]   Final    Culture     Final    Value:        BLOOD CULTURE RECEIVED NO GROWTH TO DATE CULTURE WILL BE HELD FOR 5 DAYS BEFORE ISSUING A FINAL NEGATIVE REPORT   Report Status PENDING   Incomplete   CULTURE, BLOOD (ROUTINE X 2)     Status: Normal (Preliminary result)   Collection Time   06/15/11 11:35 PM      Component Value Range Status Comment   Specimen Description BLOOD RIGHT HAND   Final    Special Requests BOTTLES DRAWN AEROBIC AND ANAEROBIC [facility] EACH   Final    Culture  Setup Time [id number]   Final    Culture     Final    Value:        BLOOD CULTURE RECEIVED NO GROWTH TO DATE CULTURE WILL BE HELD FOR 5 DAYS BEFORE ISSUING A FINAL NEGATIVE REPORT   Report Status PENDING   Incomplete   MRSA PCR SCREENING     Status: Abnormal   Collection Time   06/16/11  2:12 AM      Component Value Range Status Comment   MRSA by PCR POSITIVE (*) NEGATIVE  Final   URINE CULTURE     Status: Normal   Collection Time   06/16/11  5:38 AM      Component Value Range  Status Comment   Specimen Description URINE, CLEAN CATCH   Final    Special Requests NONE   Final    Culture  Setup Time [id number]   Final    Colony Count 4,000 COLONIES/ML   Final    Culture INSIGNIFICANT GROWTH   Final    Report Status 06/17/2011 FINAL   Final   ANAEROBIC CULTURE     Status: Normal   Collection Time   06/16/11  6:59 PM      Component Value Range Status Comment   Specimen Description ABSCESS LABIA   Final    Special Requests NONE   Final    Gram Stain     Final    Value: RARE WBC PRESENT,BOTH PMN AND MONONUCLEAR     RARE SQUAMOUS EPITHELIAL CELLS PRESENT     RARE GRAM POSITIVE COCCI IN PAIRS   Culture NO ANAEROBES ISOLATED   Final    Report Status 06/21/2011 FINAL   Final   CULTURE, ROUTINE-ABSCESS     Status: Normal   Collection Time   06/16/11  6:59 PM      Component Value Range Status Comment   Specimen Description ABSCESS LABIA   Final    Special Requests NONE   Final    Gram Stain     Final    Value: RARE WBC PRESENT,BOTH PMN AND MONONUCLEAR     RARE SQUAMOUS EPITHELIAL CELLS PRESENT     RARE GRAM POSITIVE COCCI IN PAIRS   Culture     Final    Value: MODERATE METHICILLIN RESISTANT STAPHYLOCOCCUS AUREUS     Note: RIFAMPIN AND GENTAMICIN SHOULD NOT BE USED AS SINGLE DRUGS FOR  TREATMENT OF STAPH INFECTIONS. This organism DOES NOT demonstrate inducible Clindamycin resistance in vitro. CRITICAL RESULT CALLED TO, READ BACK BY AND VERIFIED WITH: Aida Puffer      06/19/11 0810 BY [email or web address]   Report Status 06/19/2011 FINAL   Final    Organism ID, Bacteria METHICILLIN RESISTANT STAPHYLOCOCCUS AUREUS   Final     Medications:  Scheduled Meds:   . Chlorhexidine Gluconate Cloth  6 each Topical Q0600  . heparin subcutaneous  5,000 Units Subcutaneous Q8H  . mupirocin ointment  1 application Nasal BID  . pantoprazole  20 mg Oral Q1200  . potassium chloride  40 mEq Oral BID  . vancomycin  750 mg Intravenous Q12H   Continuous Infusions:   . sodium chloride 500  mL (06/21/11 0601)   PRN Meds:.acetaminophen, droperidol, HYDROmorphone (DILAUDID) injection, sodium chloride  Assessment/Plan: Right labial MRSA Abscess s/p I&D -  Pt clinically improving. Switch antibiotics to doxycycline 100 mg bid for full 10 day course per surgery.  OK for discharge from surgery standpoint.  Will need to follow up with surgeon in 1-2 weeks.   Sepsis - resolved now, off pressors  Hypokalemia - resolved. Replaced orally.  ARF - likely prerenal - resolved with IVFs.  Anemia - Hg holding stable, following  DVT prophylaxis - heparin subcut tid, doppler US BLE neg for DVT GI prophylaxis - protonix  Dispo - CSW working on SNF placement. Pt medically ready for discharge.   Updated pt's son at bedside today Pt going to [facility] when bed available.     LOS: 6 days   Alyla Pietila 06/21/2011, 2:53 PM  Cleora Fleet, MD, CDE, FAAFP [facility] [place], [place]  [phone]

## 2011-06-21 NOTE — Progress Notes (Signed)
Physical Therapy Treatment Note   06/21/11 0805  PT Visit Information  Last PT Received On 06/21/11  Assistance Needed +1  PT Time Calculation  PT Start Time 0805  PT Stop Time 0822  PT Time Calculation (min) 17 min  Subjective Data  Subjective Pt received supine in bed agreeable to PT.  Precautions  Precautions Fall  Restrictions  Weight Bearing Restrictions No  Cognition  Overall Cognitive Status Impaired  Area of Impairment Safety/judgement;Awareness of deficits  Arousal/Alertness Awake/alert  Orientation Level Appears intact for tasks assessed  Behavior During Session [facility] for tasks performed  Safety/Judgement Decreased safety judgement for tasks assessed;Decreased awareness of safety precautions;Impulsive  Awareness of Deficits decreased  Bed Mobility  Bed Mobility Supine to Sit  Supine to Sit 4: Min assist;HOB flat  Details for Bed Mobility Assistance assist for trunk elevation, pt initiated LE to EOB  Transfers  Transfers Sit to Stand;Stand to Sit  Sit to Stand 4: Min assist;With upper extremity assist;From bed  Stand to Sit 4: Min assist;With upper extremity assist;With armrests;To chair/3-in-1  Details for Transfer Assistance v/c's for hand placement and safety  Ambulation/Gait  Ambulation/Gait Assistance 4: Min assist  Ambulation Distance (Feet) 75 Feet (x2, 4 min seated rest break )  Assistive device Rolling walker  Ambulation/Gait Assistance Details + onset of fatigue, short shuffled steps  Gait Pattern Step-through pattern;Decreased stride length  Stairs No  PT - End of Session  Equipment Utilized During Treatment Gait belt  Activity Tolerance Patient limited by fatigue  Patient left in chair;with call bell/phone within reach;with family/visitor present  Nurse Communication Mobility status  PT - Assessment/Plan  Comments on Treatment Session Patient con't to present with increased fall risk, generalized weakness, decreased activity tolerance, and decreased  safety awareness.  PT Plan Discharge plan remains appropriate;Frequency remains appropriate  PT Frequency Min 3X/week  Follow Up Recommendations Skilled nursing facility  Equipment Recommended Defer to next venue  Acute Rehab PT Goals  Time For Goal Achievement 06/26/11  Potential to Achieve Goals Good  PT Goal: Supine/Side to Sit - Progress Progressing toward goal  PT Goal: Sit to Supine/Side - Progress Progressing toward goal  PT Goal: Sit to Stand - Progress Progressing toward goal  PT Goal: Ambulate - Progress Progressing toward goal     Pain: pt denies however labial dressing fell off. RN made aware.   Lewis Shock, PT, DPT Pager #: [phone] Office #: [phone]

## 2011-06-21 NOTE — Progress Notes (Signed)
VASCULAR LAB PRELIMINARY  PRELIMINARY  PRELIMINARY  PRELIMINARY  Bilateral lower extremity venous duplex  completed.    Preliminary report:  Bilateral:  No evidence of DVT, superficial thrombosis, or Baker's Cyst.   Terance Hart, RVT 06/21/2011, 11:27 AM

## 2011-06-21 NOTE — Progress Notes (Signed)
Patient ID: Joann [name], female   DOB: December 12, 1927, 78 y.o.   MRN: [id number] 5 Days Post-Op  Subjective: Pt without c/o.    Objective: Vital signs in last 24 hours: Temp:  [97.6 F (36.4 C)-98 F (36.7 C)] 98 F (36.7 C) (05/24 0534) Pulse Rate:  [67-89] 89  (05/24 0534) Resp:  [18-20] 18  (05/24 0534) BP: (111-131)/(70-83) 124/74 mmHg (05/24 0534) SpO2:  [95 %-98 %] 98 % (05/24 0534) Last BM Date: 06/19/11  Intake/Output from previous day: 05/23 0701 - 05/24 0700 In: 791 [P.O.:390; I.V.:101; IV Piggyback:300] Out: 3000 [Urine:3000] Intake/Output this shift:    PE: GU: patient is getting u/s right now.  Unable to see wound.  RN already changed dressing and reports that it is clean and no drainage present.  Lab Results:   Basename 06/21/11 0545 06/19/11 0505  WBC 8.9 9.5  HGB 11.1* 10.6*  HCT 33.4* 31.8*  PLT 262 201   BMET  Basename 06/21/11 0545 06/20/11 0611  NA 139 138  K 3.8 3.2*  CL 108 108  CO2 23 22  GLUCOSE 82 70  BUN 12 21  CREATININE 0.69 0.79  CALCIUM 7.9* 8.0*   PT/INR No results found for this basename: LABPROT:2,INR:2 in the last 72 hours CMP     Component Value Date/Time   NA 139 06/21/2011 0545   K 3.8 06/21/2011 0545   CL 108 06/21/2011 0545   CO2 23 06/21/2011 0545   GLUCOSE 82 06/21/2011 0545   BUN 12 06/21/2011 0545   CREATININE 0.69 06/21/2011 0545   CREATININE 0.84 03/07/2011 1016   CALCIUM 7.9* 06/21/2011 0545   PROT 4.5* 06/21/2011 0545   ALBUMIN 1.7* 06/21/2011 0545   AST 23 06/21/2011 0545   ALT 23 06/21/2011 0545   ALKPHOS 49 06/21/2011 0545   BILITOT 0.3 06/21/2011 0545   GFRNONAA 79* 06/21/2011 0545   GFRAA >90 06/21/2011 0545   Lipase  No results found for this basename: lipase       Studies/Results: No results found.  Anti-infectives: Anti-infectives     Start     Dose/Rate Route Frequency Ordered Stop   06/20/11 1000   vancomycin (VANCOCIN) 750 mg in sodium chloride 0.9 % 150 mL IVPB        750 mg 150 mL/hr over 60  Minutes Intravenous Every 12 hours 06/20/11 0230     06/16/11 2200   vancomycin (VANCOCIN) 750 mg in sodium chloride 0.9 % 150 mL IVPB  Status:  Discontinued        750 mg 150 mL/hr over 60 Minutes Intravenous Daily at bedtime 06/16/11 0327 06/20/11 0230   06/16/11 1800   clindamycin (CLEOCIN) IVPB 600 mg  Status:  Discontinued        600 mg 100 mL/hr over 30 Minutes Intravenous 4 times per day 06/16/11 1652 06/16/11 1700   06/16/11 1715   clindamycin (CLEOCIN) IVPB 900 mg  Status:  Discontinued        900 mg 100 mL/hr over 30 Minutes Intravenous 3 times per day 06/16/11 1700 06/20/11 1246   06/16/11 0800   piperacillin-tazobactam (ZOSYN) IVPB 3.375 g  Status:  Discontinued        3.375 g 12.5 mL/hr over 240 Minutes Intravenous 3 times per day 06/16/11 0327 06/19/11 1018   06/16/11 0115   piperacillin-tazobactam (ZOSYN) IVPB 3.375 g  Status:  Discontinued        3.375 g 100 mL/hr over 30 Minutes Intravenous  Once 06/16/11 0113 06/16/11 0140  06/16/11 0115   vancomycin (VANCOCIN) IVPB 1000 mg/200 mL premix  Status:  Discontinued        1,000 mg 200 mL/hr over 60 Minutes Intravenous  Once 06/16/11 0113 06/16/11 0140   06/16/11 0015   vancomycin (VANCOCIN) IVPB 1000 mg/200 mL premix        1,000 mg 200 mL/hr over 60 Minutes Intravenous  Once 06/16/11 0002 06/16/11 0210   06/16/11 0015  piperacillin-tazobactam (ZOSYN) IVPB 3.375 g       3.375 g 12.5 mL/hr over 240 Minutes Intravenous  Once 06/16/11 0002 06/16/11 0436           Assessment/Plan  1. S/p I&D of labial abscess  Plan: 1. Ok for [facility] from our standpoint whenever ok with medicine.  Will be a prn for the weekend. 2. follow up with surgeon in 1-2 weeks and antcipate 10 days of abx therapy is all that is necessary.      LOS: 6 days    Aidric Endicott E 06/21/2011

## 2011-06-21 NOTE — Clinical Social Work Note (Signed)
Clinical Social Worker spoke with patient and patient son at bedside to discuss patient plans at discharge.  Patient and patient family are agreeable to patient going to [facility] on Saturday 06/22/2011.  Patient family is requesting an afternoon discharge.  CSW spoke with MD who is agreeable with patient discharge plan.  Facility is prepared for patient admission.  Please fax discharge summary/AVS to [facility] at [phone].  Family requests to be notified prior to patient discharge.  Clinical Social Worker will follow up with patient and patient family to facilitate patient discharge needs to [facility].  [address] [place], [place] [phone]

## 2011-06-22 DIAGNOSIS — Z9181 History of falling: Secondary | ICD-10-CM

## 2011-06-22 DIAGNOSIS — A419 Sepsis, unspecified organism: Secondary | ICD-10-CM

## 2011-06-22 DIAGNOSIS — N731 Chronic parametritis and pelvic cellulitis: Secondary | ICD-10-CM

## 2011-06-22 DIAGNOSIS — E876 Hypokalemia: Secondary | ICD-10-CM

## 2011-06-22 DIAGNOSIS — D510 Vitamin B12 deficiency anemia due to intrinsic factor deficiency: Secondary | ICD-10-CM

## 2011-06-22 LAB — CULTURE, BLOOD (ROUTINE X 2)
Culture  Setup Time: 201305191140
Culture  Setup Time: 201305191140
Culture: NO GROWTH
Culture: NO GROWTH

## 2011-06-22 MED ORDER — ACETAMINOPHEN 325 MG PO TABS: 650.0000 mg | ORAL_TABLET | Freq: Four times a day (QID) | ORAL | Status: DC | PRN

## 2011-06-22 MED ORDER — ACIDOPHILUS PROBIOTIC 100 MG PO CAPS: 1.0000 | ORAL_CAPSULE | Freq: Three times a day (TID) | ORAL | Status: AC

## 2011-06-22 MED ORDER — DOXYCYCLINE HYCLATE 100 MG PO TABS: 100.0000 mg | ORAL_TABLET | Freq: Two times a day (BID) | ORAL | Status: AC

## 2011-06-22 NOTE — Progress Notes (Signed)
Pt to transfer to [facility] today via [facility]. Pt and SNF aware of d/c. CSW attempted to notify pt's dtr, Mitzi Davenport [phone], of d/c but no answer so voicemail requesting return call was left. CSW signing off as no other CSW needs identified. Dellie Burns, MSW, [place] [phone] (weekend)

## 2011-06-22 NOTE — Discharge Summary (Signed)
Physician Discharge Summary  Patient ID: Joann [name] MRN: [id number] DOB/AGE: 1927-09-15 78 y.o.  Admit date: 06/15/2011 Discharge date: 06/22/2011  Admission Diagnoses: Sepsis UTI Labial Abscess History of hyperlipidemia Nicotine user-long history  Discharge Diagnoses:    Abscess of female pelvis - s/p incision and drainage  MRSA (methicillin resistant staph aureus) culture positive  Risk for falls  Hyperlipidemia  Nicotine user  Discharged Condition: good  Hospital Course:  Right labial MRSA Abscess s/p I&D - Pt clinically improved.  Patient completed 5 days of IV vancomycin therapy and was switched to oral doxycycline to complete an additional 5 days of therapy for his full ten-day course of antibiotic treatment.  OK for discharge from surgery standpoint. Will need to follow up with surgeon Dr. Carolynne Edouard  in 1-2 weeks.  Please call and make the patient an appointment for followup.  The patient will need to continue saline wet-to-dry dressing changes 2 times per day.  The packing should be removed prior to taking a shower and then replaced after the shower.  Sepsis - resolved.  the patient was initially admitted into the intensive care unit and required pressor therapy and was treated by the critical care specialist.  She quickly improved after having an incision and drainage of the abscess.  This was performed by Dr.Toth.   Hypokalemia - resolved. Replaced orally.   ARF - likely prerenal - resolved with IVFs.   Anemia - Hg holding stable around 11.  Physical deconditioning-the patient was seen by the physical therapy service and they recommended skilled nursing placement and continued physical therapy.  At this time the patient is a high fall risk.  Fall precautions recommended.    The Patient Was Encouraged to Discontinue Nicotine Products.    Consults: general surgery, critical care specialists, physical therapy occupational therapy   Treatments: Incision and drainage    Discharge Exam: the patient is sitting up in the bed eating breakfast with no complaints, cooperative.   Blood pressure 128/68, pulse 72, temperature 98.1 F (36.7 C), temperature source Oral, resp. rate 16, height 5\' 7"  (1.702 m), weight 76.3 kg (168 lb 3.4 oz), SpO2 97.00%. General - awake, no distress, cooperative  HEENT - NCAT, MMM Lungs - BBS, CTA  CV - normal s1, s2 sounds  Abd - soft, nondistended, no masses, nontender  Ext - no C/C/E Wound-clean dry and healing, very minimal drainage   Disposition:  to skilled nursing facility   Discharge Orders    Future Orders Please Complete By Expires   Increase activity slowly        Medication List  As of 06/22/2011  7:42 AM   STOP taking these medications         ibuprofen 200 MG tablet         TAKE these medications         acetaminophen 325 MG tablet   Commonly known as: TYLENOL   Take 2 tablets (650 mg total) by mouth every 6 (six) hours as needed for pain or fever.      doxycycline 100 MG tablet   Commonly known as: VIBRA-TABS   Take 1 tablet (100 mg total) by mouth every 12 (twelve) hours.  Take with food            Follow-up Information    Follow up with Robyne Askew, MD. Schedule an appointment as soon as possible for a visit in 1 week. (1-2 week follow up)    Contact information:   [address] [place]  [facility] [address] [place] [address] [phone]       Follow up with Ellin Mayhew, MD. Schedule an appointment as soon as possible for a visit in 2 weeks. (hospital follow up)    Contact information:   [address] [place] [address] [phone]         I spent 40 minutes preparing this patient's discharge, reviewing medical records consultation notes, reconciling medications, preparing discharge summary and followup.  SignedStandley Dakins 06/22/2011, 7:42 AM  pager [phone]  [facility]

## 2011-06-22 NOTE — Progress Notes (Signed)
Discharge instructions given to SNF.  EMS here to pick patient up. Patient has all belongings with her.  All questions were answered.  Patient vital signs are stable. Dressing wet to dry was changed at 1130 am.

## 2011-06-22 NOTE — Discharge Instructions (Signed)
Normal Saline wet to dry dressing changes to wound twice a day.  Remove packing prior to taking a shower, replace packing once shower completed  Set up appointment to follow up with your surgeon Dr. Carolynne Edouard in 1-2 weeks for recheck  Incision and Drainage Incision and drainage (I&D) is a procedure in which a cavity-like structure (cystic structure) is opened and drained. The cyst to be drained usually contains material such as pus, fluid, or blood. Gauze is sometimes packed into the cut (incision). Keeping a drain or piece of gauze in the incision keeps the skin from healing first. This helps stop the cyst from forming again. HOME CARE INSTRUCTIONS   Only take over-the-counter or prescription medicines for pain, discomfort, or fever as directed by your caregiver. Use these only if your caregiver has not given medicines that would interfere.   See your caregiver as directed for a recheck.   If medicines (antibiotics) that kill germs were prescribed, take them as directed.  SEEK MEDICAL CARE IF:   You develop increased pain, swelling, redness, drainage, or bleeding in the wound.   You develop signs of an infection. These signs include muscle aches, chills, or a general ill feeling.   You have a fever.  MAKE SURE YOU:   Understand these instructions.   Will watch your condition.   Will get help right away if you are not doing well or get worse.  Document Released: 07/10/2000 Document Revised: 01/03/2011 Document Reviewed: 09/04/2007 [facility] Patient Information 2012 [place], [place].  RETURN IF SYMPTOMS RECUR, WORSEN OR NEW PROBLEMS DEVELOP.

## 2011-07-17 ENCOUNTER — Telehealth (INDEPENDENT_AMBULATORY_CARE_PROVIDER_SITE_OTHER): Payer: Self-pay | Admitting: General Surgery

## 2011-07-17 NOTE — Telephone Encounter (Signed)
Steward Drone from [facility] had called saying that one of their patients had surgery by Dr. Carolynne Edouard on 5/19 for I&D perirectal abscess and she needs a PO appt with him.  I gave her an appt for 6/21 at 4:20.  She said she would have the patient here for her appt on time.

## 2011-07-17 NOTE — Telephone Encounter (Signed)
BRENDA CALLED FROM [facility] RE RESIDENT Joann [name]. PT NEEDS F/U APPT WITH DR. P. TOTH PER DISCHARGE INSRUCTIONS. PT WAS DISCHARGED SEVERAL WEEKS AGO AND SHOULD BE SEEN SOON. SHE HAD I&D OF LABIAL ABSCESS. FACILITY IS PERFORMING DRESSING CHANGES./ I COULD NOT FIND ANY APPOINTMENTS WITHIN A 2 WEEK PERIOD. PLEASE CALL BRENDA/ [phone]. THANKS GERMAINE

## 2011-07-19 ENCOUNTER — Ambulatory Visit (INDEPENDENT_AMBULATORY_CARE_PROVIDER_SITE_OTHER): Payer: PRIVATE HEALTH INSURANCE | Admitting: General Surgery

## 2011-07-19 ENCOUNTER — Encounter (INDEPENDENT_AMBULATORY_CARE_PROVIDER_SITE_OTHER): Payer: Self-pay | Admitting: General Surgery

## 2011-07-19 VITALS — BP 126/78 | HR 76 | Temp 97.0°F | Ht 67.0 in | Wt 139.2 lb

## 2011-07-19 DIAGNOSIS — N739 Female pelvic inflammatory disease, unspecified: Secondary | ICD-10-CM

## 2011-07-19 DIAGNOSIS — N731 Chronic parametritis and pelvic cellulitis: Secondary | ICD-10-CM

## 2011-07-19 NOTE — Patient Instructions (Signed)
Shower daily and repack gauze into wound 

## 2011-07-25 ENCOUNTER — Ambulatory Visit (INDEPENDENT_AMBULATORY_CARE_PROVIDER_SITE_OTHER): Payer: PRIVATE HEALTH INSURANCE | Admitting: Family Medicine

## 2011-07-25 ENCOUNTER — Encounter: Payer: Self-pay | Admitting: Family Medicine

## 2011-07-25 VITALS — BP 115/66 | HR 80 | Temp 98.4°F | Ht 67.0 in | Wt 138.0 lb

## 2011-07-25 DIAGNOSIS — N739 Female pelvic inflammatory disease, unspecified: Secondary | ICD-10-CM

## 2011-07-25 DIAGNOSIS — F17200 Nicotine dependence, unspecified, uncomplicated: Secondary | ICD-10-CM

## 2011-07-25 DIAGNOSIS — Z8639 Personal history of other endocrine, nutritional and metabolic disease: Secondary | ICD-10-CM

## 2011-07-25 DIAGNOSIS — Z862 Personal history of diseases of the blood and blood-forming organs and certain disorders involving the immune mechanism: Secondary | ICD-10-CM

## 2011-07-25 DIAGNOSIS — Z9889 Other specified postprocedural states: Secondary | ICD-10-CM

## 2011-07-25 DIAGNOSIS — N731 Chronic parametritis and pelvic cellulitis: Secondary | ICD-10-CM

## 2011-07-25 DIAGNOSIS — Z978 Presence of other specified devices: Secondary | ICD-10-CM

## 2011-07-25 LAB — BASIC METABOLIC PANEL
BUN: 9 mg/dL (ref 6–23)
CO2: 25 mEq/L (ref 19–32)
Calcium: 9.5 mg/dL (ref 8.4–10.5)
Chloride: 104 mEq/L (ref 96–112)
Creat: 0.75 mg/dL (ref 0.50–1.10)
Glucose, Bld: 91 mg/dL (ref 70–99)
Potassium: 4.2 mEq/L (ref 3.5–5.3)
Sodium: 139 mEq/L (ref 135–145)

## 2011-07-25 NOTE — Progress Notes (Signed)
  Subjective:    Patient ID: Joann [name], female    DOB: [birth year], 78 y.o.   MRN: [id number]  HPI Pt here for hospital follow up:  Labial/perineal wound: Patient admitted to the hospital in May was septic with labial abscess. Required pressors and was in the ICU. Patient stabilize once I and D was performed by surgery of infection. Patient was placed on vancomycin and transitioned to doxycycline. Culture positive for MRSA. Has large wound from incision and drainage that runs from upper right labia down to rectum. Patient states it is healing well. Home health nurse coming out 2 times a week for dressing changes. His helping family learn how to do dressing changes. Family is having a hard time finding someone to change this 2 times a day. No fever. No drainage. No foul odor. No surrounding erythema.  Indwelling foley cath:  Patient has indwelling Foley catheter. Patient was told this was placed to keep the urine out of the wound. That doing this should help the wound heal better and keep out infection. Pt doesn't know what MD is managing this foley.   No urethra pain. No back pain. No fever. No abd pain.  Urine normal color.  Some sediment in foley cath.  No drainage from urethra.  No redness of urethra.    Abnormal labs f/up: Pt had hypokalemia and acute renal failure during hospitalization. These levels were normalized prior to D/C from hospital.  Pt agrees to f/up labwork regarding these issues.   Smoking counseling: Pt still smoking. No ready to quit.   Review of Systems As per above    Objective:   Physical Exam  Constitutional: She appears well-developed and well-nourished.  Cardiovascular: Normal rate, regular rhythm and normal heart sounds.   No murmur heard. Pulmonary/Chest: Effort normal. No respiratory distress.  Genitourinary:          Foley cath in place  Skin: No rash noted.  Psychiatric: She has a normal mood and affect.          Assessment & Plan:

## 2011-07-25 NOTE — Patient Instructions (Addendum)
Return in 2-4 weeks for recheck.  My name is Ellin Mayhew, MD  -- [phone]

## 2011-07-26 ENCOUNTER — Telehealth: Payer: Self-pay | Admitting: Family Medicine

## 2011-07-26 ENCOUNTER — Encounter: Payer: Self-pay | Admitting: Family Medicine

## 2011-07-26 ENCOUNTER — Telehealth (INDEPENDENT_AMBULATORY_CARE_PROVIDER_SITE_OTHER): Payer: Self-pay | Admitting: General Surgery

## 2011-07-26 DIAGNOSIS — Z978 Presence of other specified devices: Secondary | ICD-10-CM | POA: Insufficient documentation

## 2011-07-26 NOTE — Telephone Encounter (Signed)
I spoke with patient home health nurse and asked her to discontinue the catheter.  This will be done today.

## 2011-07-26 NOTE — Assessment & Plan Note (Signed)
No mention of foley cath in D/C summary- pt able to ambulate and self- toilet without cath.  Called Dr. Carolynne Edouard, surgeon, to ask if I could d/c.  He states that he is not managing this and that he doesn't see a problem in discontinuing the cath at this time from a surgical standpoint. Will call patient and let her know that home health nurse can d/c.

## 2011-07-26 NOTE — Telephone Encounter (Signed)
Johnny Bridge, nurse with [facility], calling to inquire about a Foley catheter place during surgery 06/15/11.  Paged Dr. Carolynne Edouard, who answered he did not order, nor does he manage Foleys.  Researched pt record and found she was admitted via ER and managed by [facility], who ultimately discharged her home.  [facility] saw her strictly as consult for abscess I&D.  Suggested HH nurse contact [facility] MD for orders, or pt's PCP.  She understands and will follow-up there.

## 2011-07-26 NOTE — Assessment & Plan Note (Addendum)
Pt states that she is not ready to quit at this time. Encouraged pt to quit- this will help promote wound healing.

## 2011-07-26 NOTE — Assessment & Plan Note (Signed)
Surgical site from I and D- healing well.  Wound care per home health nurse.  Reviewed red flags for return.  Pt being followed by Dr. Carolynne Edouard.

## 2011-07-26 NOTE — Telephone Encounter (Signed)
Fwd. To Dr.Caviness for review. .Joann [name]  

## 2011-07-26 NOTE — Telephone Encounter (Signed)
Daughter is calling asking for Dr. Edmonia James to call Martha/Caregiver about the patients catheter.  The nurse is there now and says that it is very important that she speak to Dr. Arlys John as soon as possible.

## 2011-07-30 ENCOUNTER — Ambulatory Visit: Payer: PRIVATE HEALTH INSURANCE | Admitting: Family Medicine

## 2011-08-08 NOTE — Progress Notes (Signed)
Subjective:     Patient ID: Joann [name], female   DOB: [birth year], 78 y.o.   MRN: [id number]  HPI The patient is an 78 year old black female who is about a month out from an incision and drainage of a perirectal abscess. She has been doing relatively well and has no complaints today. She has been getting wound care and things seem to be improving.  Review of Systems     Objective:   Physical Exam On exam the perirectal wound is very clean with good granulation tissue. We will redress the wound today and she tolerated this well.   Assessment:     One month status post incision and drainage of perirectal abscess    Plan:     At this point she seems to be taking good care of the wound. It is very clean. I would like her to continue to try to shower and keep the wound clean and dry. We will plan to see her back in about a month

## 2011-08-12 ENCOUNTER — Emergency Department (HOSPITAL_COMMUNITY): Payer: PRIVATE HEALTH INSURANCE

## 2011-08-12 ENCOUNTER — Encounter (HOSPITAL_COMMUNITY): Payer: Self-pay | Admitting: Emergency Medicine

## 2011-08-12 ENCOUNTER — Emergency Department (HOSPITAL_COMMUNITY)
Admission: EM | Admit: 2011-08-12 | Discharge: 2011-08-12 | Disposition: A | Discharge status: Home / Self Care | Payer: PRIVATE HEALTH INSURANCE | Attending: Emergency Medicine | Admitting: Emergency Medicine

## 2011-08-12 DIAGNOSIS — M1610 Unilateral primary osteoarthritis, unspecified hip: Secondary | ICD-10-CM | POA: Insufficient documentation

## 2011-08-12 DIAGNOSIS — M169 Osteoarthritis of hip, unspecified: Secondary | ICD-10-CM | POA: Insufficient documentation

## 2011-08-12 DIAGNOSIS — W010XXA Fall on same level from slipping, tripping and stumbling without subsequent striking against object, initial encounter: Secondary | ICD-10-CM | POA: Insufficient documentation

## 2011-08-12 DIAGNOSIS — Y92009 Unspecified place in unspecified non-institutional (private) residence as the place of occurrence of the external cause: Secondary | ICD-10-CM | POA: Insufficient documentation

## 2011-08-12 DIAGNOSIS — W19XXXA Unspecified fall, initial encounter: Secondary | ICD-10-CM

## 2011-08-12 DIAGNOSIS — E7800 Pure hypercholesterolemia, unspecified: Secondary | ICD-10-CM | POA: Insufficient documentation

## 2011-08-12 DIAGNOSIS — S0191XA Laceration without foreign body of unspecified part of head, initial encounter: Secondary | ICD-10-CM

## 2011-08-12 DIAGNOSIS — G319 Degenerative disease of nervous system, unspecified: Secondary | ICD-10-CM | POA: Insufficient documentation

## 2011-08-12 DIAGNOSIS — S0180XA Unspecified open wound of other part of head, initial encounter: Secondary | ICD-10-CM | POA: Insufficient documentation

## 2011-08-12 MED ORDER — TETANUS-DIPHTH-ACELL PERTUSSIS 5-2.5-18.5 LF-MCG/0.5 IM SUSP
INTRAMUSCULAR | Status: AC
  Administered 2011-08-12: 0.5 mL via INTRAMUSCULAR
  Filled 2011-08-12: qty 0.5

## 2011-08-12 MED ORDER — TETANUS-DIPHTH-ACELL PERTUSSIS 5-2.5-18.5 LF-MCG/0.5 IM SUSP
0.5000 mL | Freq: Once | INTRAMUSCULAR | Status: AC
  Administered 2011-08-12: 0.5 mL via INTRAMUSCULAR

## 2011-08-12 NOTE — ED Provider Notes (Signed)
I saw and evaluated the patient, reviewed the resident's note and I agree with the findings and plan.   Awake and alert No hemotympanum  EOMI PERRL Small laceration just lateral to the right eyebrow RRR CTAB NABS FROM x 4 Cn 2-12 intact  CT and dermabond and close follow up  Tyann Niehaus Smitty Cords, MD 08/12/11 [phone]

## 2011-08-12 NOTE — ED Provider Notes (Signed)
History     CSN: [id number]  Arrival date & time 08/12/11  0530   First MD Initiated Contact with Patient 08/12/11 [phone]      Chief Complaint  Patient presents with  . Head Laceration  . Fall    (Consider location/radiation/quality/duration/timing/severity/associated sxs/prior treatment) HPI Comments: Pleasant 78 yo who presents after a fall at home. She was swatting at a bug and slipped, bumped her right knee and her right forehead on the floor. She denies losing consciousness. No changes in her vision, no headaches, no slurred speech, no trouble walking. Denies dizziness or palpitations before the fall. No lightheadedness, no loss of vision.   No recent history of falls. Pt did have a perirectal abscess recently drained and is currently having dressing changes at home.   Past Medical History  Diagnosis Date  . Hypercholesteremia   . Hip osteoarthritis 09/24/2010    Past Surgical History  Procedure Date  . Incision and drainage perirectal abscess 06/16/2011    Procedure: IRRIGATION AND DEBRIDEMENT PERIRECTAL ABSCESS;  Surgeon: Robyne Askew, MD;  Location: [facility] OR;  Service: General;  Laterality: N/A;    Family History  Problem Relation Age of Onset  . Kidney disease Mother   . Cancer Sister     colon  . Cancer Brother     lung  . Cancer Sister     stomach    History  Substance Use Topics  . Smoking status: Former Smoker -- 0.3 packs/day    Types: Cigarettes    Quit date: 06/15/2011  . Smokeless tobacco: Not on file  . Alcohol Use: No    OB History    Grav Para Term Preterm Abortions TAB SAB Ect Mult Living                  Review of Systems  Constitutional: Negative for fever, chills and fatigue.  Eyes: Negative for visual disturbance.  Respiratory: Negative for chest tightness and shortness of breath.   Cardiovascular: Negative for chest pain.  Gastrointestinal: Negative for nausea, vomiting, diarrhea, constipation and blood in stool.  Neurological:  Negative for light-headedness and numbness.    Allergies  Sulfonamide derivatives  Home Medications   Current Outpatient Rx  Name Route Sig Dispense Refill  . MULTI-VITAMIN/MINERALS PO TABS Oral Take 1 tablet by mouth daily.    Marland Kitchen VITAMIN C 500 MG PO TABS Oral Take 500 mg by mouth daily.    Marland Kitchen ZINC SULFATE 220 MG PO CAPS Oral Take 220 mg by mouth daily.      BP 126/72  Pulse 88  Temp 98.2 F (36.8 C) (Oral)  Resp 20  SpO2 98%  Physical Exam  Nursing note and vitals reviewed. Constitutional: She is oriented to person, place, and time. She appears well-developed and well-nourished.  Eyes: Pupils are equal, round, and reactive to light.  Cardiovascular: Normal rate, S1 normal and S2 normal.   No murmur heard. Pulmonary/Chest: Effort normal and breath sounds normal. She has no wheezes. She has no rales.  Abdominal: Soft. She exhibits no distension. There is no tenderness.  Musculoskeletal: She exhibits no edema.  Neurological: She is alert and oriented to person, place, and time. No cranial nerve deficit or sensory deficit.  Skin: Skin is warm, dry and intact.       9mm laceration just lateral to R eyebrow. No active bleeding.    ED Course  Procedures (including critical care time)  Labs Reviewed - No data to display Ct Head  Wo Contrast  08/12/2011  *RADIOLOGY REPORT*  Clinical Data: Laceration above the right orbit after fall.  No loss of consciousness.  CT HEAD WITHOUT CONTRAST  Technique:  Contiguous axial images were obtained from the base of the skull through the vertex without contrast.  Comparison: None.  Findings: Mild diffuse cerebral atrophy.  Low attenuation changes in the deep and periventricular white matter consistent with small vessel ischemia.  No mass effect or midline shift.  No abnormal extra-axial fluid collections.  Gray-white matter junctions are distinct.  Basal cisterns are not effaced.  No evidence of acute intracranial hemorrhage.  No depressed skull  fractures. Visualized paranasal sinuses and mastoid air cells are not opacified.  Vascular calcifications.  Tiny apparent soft tissue gas collections adjacent to the inferior aspect of the nasal bones on the lowest slice probably represent artifact.  IMPRESSION: No acute intracranial abnormalities.  Chronic atrophy and small vessel ischemic changes.  Original Report Authenticated By: Marlon Pel, M.D.    LACERATION REPAIR Performed by: Denton Ar Authorized by: Denton Ar Consent: Verbal consent obtained. Risks and benefits: risks, benefits and alternatives were discussed Consent given by: patient Patient identity confirmed: provided demographic data Prepped in normal sterile fashion Wound explored  Laceration Location: lateral to R eyebrow  Laceration Length: 9mm  No Foreign Bodies seen or palpated  Anesthesia: none  Irrigation method: syringe  Amount of cleaning: standard  Skin closure: dermabond   Patient tolerance: Patient tolerated the procedure well with no immediate complications.   1. Fall at home   2. Laceration of head       MDM    Well-appearing 78 yo who presents after what sounds like a mechanical fall at home. Vital signs stable. History not consistent with syncope, arrhythmia. CT without acute bleed or evidence of trauma. Cranial nerves in tact.  Dermabond on laceration. Tetanus shot given. Pt advised not to use neosporin, bacitracin or petroleum jelly on the face. Pt advised to return to ED if she develops HA, neurologic symptoms.            Larey Seat, MD 08/12/11 [phone]

## 2011-08-12 NOTE — ED Notes (Signed)
Pt and family report she fell attempting to strike an insect

## 2011-08-16 ENCOUNTER — Ambulatory Visit (INDEPENDENT_AMBULATORY_CARE_PROVIDER_SITE_OTHER): Payer: PRIVATE HEALTH INSURANCE | Admitting: Family Medicine

## 2011-08-16 ENCOUNTER — Encounter: Payer: Self-pay | Admitting: Family Medicine

## 2011-08-16 VITALS — BP 108/71 | HR 89 | Ht 67.0 in | Wt 137.0 lb

## 2011-08-16 DIAGNOSIS — N731 Chronic parametritis and pelvic cellulitis: Secondary | ICD-10-CM

## 2011-08-16 DIAGNOSIS — F17200 Nicotine dependence, unspecified, uncomplicated: Secondary | ICD-10-CM

## 2011-08-16 DIAGNOSIS — N739 Female pelvic inflammatory disease, unspecified: Secondary | ICD-10-CM

## 2011-08-16 NOTE — Patient Instructions (Addendum)
Great job with the wound care.   Continue to consider stopping smoking-  1-800-Quit- Now  Dr. Fara Boros will be your new doctor-  Return in 1 month for a complete physical

## 2011-08-19 NOTE — Progress Notes (Signed)
  Subjective:    Patient ID: Joann [name], female    DOB: 1927-09-18, 78 y.o.   MRN: [id number]  HPI Wound recheck: Pt states that right labial /perineum wound is healing well.  No drainage.  No redness. No fever.  Healed completely now per daughter who has been checking wound daily.  Pt has been urinating well.  No problems with bowel movements.  Good appetite.  F/up with dr. Carolynne Edouard on 7/26.  Per pt.    Smoking status reviewed.    Review of Systems As per above    Objective:   Physical Exam  Constitutional: She appears well-developed and well-nourished.  Cardiovascular: Normal rate, regular rhythm and normal heart sounds.   No murmur heard. Pulmonary/Chest: Effort normal. No respiratory distress. She has no wheezes.  Musculoskeletal: She exhibits no edema.  Skin:       Left labial wound: See last ov for diagram of previous wound.  Right labial and perineal wound no completely healed.  No drainage. No erythema.           Assessment & Plan:

## 2011-08-19 NOTE — Assessment & Plan Note (Signed)
Wound now completely healed per my exam.  Pt to go to regularly scheduled f/up with Dr. Carolynne Edouard- surgeon.  Pt is past due for regular physical exam.  Pt to return in 1 month for yearly physical exam with new pcp.

## 2011-08-19 NOTE — Assessment & Plan Note (Signed)
encouraged smoking cessation. Pt not ready to quit.

## 2011-08-23 ENCOUNTER — Encounter (INDEPENDENT_AMBULATORY_CARE_PROVIDER_SITE_OTHER): Payer: Self-pay | Admitting: General Surgery

## 2011-08-23 ENCOUNTER — Ambulatory Visit (INDEPENDENT_AMBULATORY_CARE_PROVIDER_SITE_OTHER): Payer: PRIVATE HEALTH INSURANCE | Admitting: General Surgery

## 2011-08-23 VITALS — BP 124/82 | HR 78 | Temp 98.6°F | Resp 16 | Ht 67.0 in | Wt 138.8 lb

## 2011-08-23 DIAGNOSIS — K612 Anorectal abscess: Secondary | ICD-10-CM

## 2011-08-23 DIAGNOSIS — K611 Rectal abscess: Secondary | ICD-10-CM | POA: Insufficient documentation

## 2011-08-23 NOTE — Progress Notes (Signed)
Subjective:     Patient ID: Joann [name], female   DOB: 12/17/1927, 78 y.o.   MRN: [id number]  HPI The patient is a 78 year old white female has a couple months out from an incision and drainage of a perirectal abscess. She is feeling very well today. She has no complaints. She feels that area is completely healed.  Review of Systems     Objective:   Physical Exam On exam the area where the perirectal abscess is has completely healed. She has a small linear scar left. There is no evidence for infection.    Assessment:     Status post incision and drainage of a perirectal abscess that is now healed    Plan:     At this point she can return to her normal activities without any restrictions. We will plan to see her back on a p.r.n. basis.

## 2011-08-27 ENCOUNTER — Ambulatory Visit: Payer: PRIVATE HEALTH INSURANCE | Admitting: Family Medicine

## 2011-09-11 ENCOUNTER — Ambulatory Visit (INDEPENDENT_AMBULATORY_CARE_PROVIDER_SITE_OTHER): Payer: PRIVATE HEALTH INSURANCE | Admitting: Family Medicine

## 2011-09-11 ENCOUNTER — Encounter: Payer: Self-pay | Admitting: Family Medicine

## 2011-09-11 VITALS — BP 148/78 | HR 82 | Ht 67.0 in | Wt 142.0 lb

## 2011-09-11 DIAGNOSIS — R030 Elevated blood-pressure reading, without diagnosis of hypertension: Secondary | ICD-10-CM | POA: Insufficient documentation

## 2011-09-11 DIAGNOSIS — F17200 Nicotine dependence, unspecified, uncomplicated: Secondary | ICD-10-CM

## 2011-09-11 DIAGNOSIS — K611 Rectal abscess: Secondary | ICD-10-CM

## 2011-09-11 DIAGNOSIS — E785 Hyperlipidemia, unspecified: Secondary | ICD-10-CM

## 2011-09-11 DIAGNOSIS — K612 Anorectal abscess: Secondary | ICD-10-CM

## 2011-09-11 NOTE — Assessment & Plan Note (Signed)
Again encouraged smoking cessation.  Pt says she will quit cold [place] once she's ready.

## 2011-09-11 NOTE — Assessment & Plan Note (Signed)
Pt would like to start something to help with her cholesterol-- will start fish oil 1g daily, last LDL elevated to 168 03/2011.

## 2011-09-11 NOTE — Assessment & Plan Note (Signed)
No h/o HTN. 1st elevated reading. F/u 1-2 months for recheck.

## 2011-09-11 NOTE — Assessment & Plan Note (Signed)
S/p I&D by surgery 05/2011, MRSA. Healed/resolved.  Finished w/ surgery f/u as of 3 weeks ago.

## 2011-09-11 NOTE — Progress Notes (Signed)
S: Pt comes in today for follow up.   ABSCESS Was MRSA, being followed by Surgery since pt s/p I&D in 05/2011 for perirectal abscess.  Feels like strength is getting better since leaving SNF on 07/22/11 (~4 week stay).  Area is well healed, last saw surgeons ~2-3 weeks ago and was cleared-- f/u PRN from their stand point.    ELEVATED BP Has never had elevated BP before.  No CP, SOB, edema.     TOBACCO ABUSE Smokes 6-7 cig/day.  Knows she needs to quit, but isn't ready yet.   HYPERLIPIDEMIA  Meds: none, previously on statin Muscle aches: n/a Last FLP or LDL:  Lab Results  Component Value Date   LDLCALC 121* 12/14/2008   Lab Results  Component Value Date   CHOL 215* 12/14/2008   HDL 60 12/14/2008   LDLCALC 121* 12/14/2008   LDLDIRECT 168* 03/07/2011   TRIG 171* 12/14/2008   CHOLHDL 3.6 Ratio 12/14/2008    Diet: nothing in particular  Exercise: walking x 10 min every day  Weight:  Wt Readings from Last 3 Encounters:  09/11/11 142 lb (64.411 kg)  08/23/11 138 lb 12.8 oz (62.959 kg)  08/16/11 137 lb (62.143 kg)      ROS: Per HPI  History  Smoking status  . Current Everyday Smoker -- 0.5 packs/day  . Types: Cigarettes  . Last Attempt to Quit: 06/15/2011  Smokeless tobacco  . Not on file    O:  Filed Vitals:   09/11/11 1335  BP: 148/78  Pulse: 82    Gen: NAD, very pleasant  CV: RRR, no murmur Pulm: CTA bilat, no wheezes or crackles Abd: soft, NT, normoactive BS Ext: Warm, no edema   A/P: 78 y.o. female p/w elevated bP (1st reading), HLD, resolved perirectal abscess, tobacco abuse  -See problem list -f/u in 1-2 months for BP recheck

## 2011-09-11 NOTE — Patient Instructions (Addendum)
It was nice to meet you today!  For your cholesterol, you can start taking fish oil 1 gram every day.  Putting it in the freezer will make it taste less fishy.  QUIT SMOKING!!!!  We have lots of resources to help you quit if you want them.  You can use 1-800-QUIT-NOW phone line.  Come back in 1-2 months so we can recheck your blood pressure.

## 2011-12-16 ENCOUNTER — Ambulatory Visit (INDEPENDENT_AMBULATORY_CARE_PROVIDER_SITE_OTHER): Payer: PRIVATE HEALTH INSURANCE | Admitting: Family Medicine

## 2011-12-16 VITALS — BP 132/76 | HR 85 | Ht 67.0 in | Wt 145.0 lb

## 2011-12-16 DIAGNOSIS — L0291 Cutaneous abscess, unspecified: Secondary | ICD-10-CM | POA: Insufficient documentation

## 2011-12-16 MED ORDER — MUPIROCIN 2 % EX OINT: TOPICAL_OINTMENT | Freq: Every day | CUTANEOUS | Status: DC

## 2011-12-16 MED ORDER — CHLORHEXIDINE GLUCONATE 4 % EX LIQD: 60.0000 mL | Freq: Every day | CUTANEOUS | Status: DC | PRN

## 2011-12-16 NOTE — Patient Instructions (Addendum)
Thanks for coming in today!  I have written a prescription for mupirocin, an antibiotic ointment, I'd like you to put it in your nose once a day for 5 days as we discussed Also use Hibiclens (chlorhexidine wash) daily for the next two weeks.   Use Warm compressess for 15 minutes 3 to 6 times a day, for the next two weeks.   Come back to see [place] right away if you develop fevers, if the bumps get worse, if you begin to get more tired than usual, or for any other concerning symptom.   Schedule another appointment with your PCP in 2 weeks to make sure these are getting better, come back sooner if it gets worse.

## 2011-12-16 NOTE — Progress Notes (Signed)
  Subjective:    Patient ID: Joann [name], female    DOB: 05-Aug-1927, 78 y.o.   MRN: [id number]  HPI  Pt here for acute visit with multiple itchy bumps in BL axilla and groin for about two weeks.  Not painful They started with just being itchy and the bumps came the next day, the number of bumps has increased.  Notes drainage of the one in her groin area Denies shaving underarms.  Denies fevers, chills. Having some sweats but describes multiple blankets No nausea, vomiting, or diarrhea  + Hx of MRSA, ICU stay in the summer  Review of Systems Per HPI plus  No shortness of breath No chest pain No abdominal pain    Objective:   Physical Exam  Gen: NAD, alert, cooperative with exam, walks very easily and moves easily on bed CV: RRR, good S1/S2 Resp: CTABL, no wheezes, non-labored Ext: No edema noted Neuro: Alert and oriented, No gross deficits Skin:  L Axilla: 4 discrete flesh colored and yellow bumps approx 6 to 8 mm in size, raised, one centrally located grossly yellow and appears to b epus-filled, no surrounding induration or erythema  R Axilla- 4 to 5 flesh colored bumps less than 8 mm in size, flesh colored Perineum: one 5 to 10 mm black to brown/flesh colored superficial lesion with sero-sanguinous drainage jsut above the mons pubis. Area of surrounding erythema approx 3 cm, No induration  - uterine prolapse  - two 1 to 3 mm heme crusted lesions on R groin  That appear to be healing.         Assessment & Plan:

## 2011-12-16 NOTE — Assessment & Plan Note (Signed)
Multiple in BL axilla and lower abdomen/upper perineal area No signs of systemic infection, vitals stable Hx of ICU stay from perineal abscess and MRSA infection Considering that these are small I will treat conservatively to begin, advised if they worsen or signs of systemic infection to come right back.   Warm compresses for 15 min 3 to 6 times a day to affected area. Mupirocin nasally 5 days for irradication Hibiclens daily for 2 weeks

## 2012-01-01 ENCOUNTER — Ambulatory Visit: Payer: PRIVATE HEALTH INSURANCE | Admitting: Family Medicine

## 2012-01-09 ENCOUNTER — Encounter: Payer: Self-pay | Admitting: Family Medicine

## 2012-01-09 ENCOUNTER — Ambulatory Visit (INDEPENDENT_AMBULATORY_CARE_PROVIDER_SITE_OTHER): Payer: PRIVATE HEALTH INSURANCE | Admitting: Family Medicine

## 2012-01-09 VITALS — BP 140/77 | HR 79 | Ht 67.0 in | Wt 147.9 lb

## 2012-01-09 DIAGNOSIS — L0291 Cutaneous abscess, unspecified: Secondary | ICD-10-CM

## 2012-01-09 DIAGNOSIS — R030 Elevated blood-pressure reading, without diagnosis of hypertension: Secondary | ICD-10-CM

## 2012-01-09 NOTE — Assessment & Plan Note (Signed)
1 elevated reading 08/2011, goal <140/90; today is technically normal at 140/77.  Will monitor closely but no need to initiate medication at this time.

## 2012-01-09 NOTE — Assessment & Plan Note (Signed)
Appears c/w hidradenitis suppurativa but is first occurrence.  All areas appear to be healing well- none appear infected today.  Pt did not use the mupirocin but has been doing the Hibiclens.  Pt can d/c hibiclens. Ok to use deodorant.  F/u for new lesions.

## 2012-01-09 NOTE — Progress Notes (Signed)
S: Pt comes in today for follow up.  ABSCESS Seen 11/18 for multiple small abscesses bilateral axilla, as well as lower abd/upper perineal area.  She was given mupirocin nasally x5 days and then 2 weeks of hibiclens as patient as h/o MRSA with ICU stay 2/2 perirectal abscess 05/2011.  Denies N/V, fever.  No new areas of infection. No drainage, soreness, or redness of areas.  Feels like they are much better.     ELEVATED BP Pt with elevated BP (148/78) on 09/11/2011 without history of hypertension.  No CP, SOB, LE edema, headaches, dizziness.  Goal BP is <140/90.    ROS: Per HPI  History  Smoking status  . Current Every Day Smoker -- 0.5 packs/day  . Types: Cigarettes  . Last Attempt to Quit: 06/15/2011  Smokeless tobacco  . Not on file    O:  Filed Vitals:   01/09/12 1452  BP: 140/77  Pulse: 79    Gen: NAD CV: RRR, no murmur Pulm: CTA bilat, no wheezes or crackles Ext: Warm, multiple (3-5) small, palpable nodules in bilateral axilla with overlying hyperpigmentation- no fluctuance, erythema, edema, or tenderness   A/P: 78 y.o. female p/w resolved elevated BP, resolving abscesses -See problem list -f/u in 3 months

## 2012-01-09 NOTE — Patient Instructions (Addendum)
It was good to see you today.  Your blood pressure looks ok; we will keep an eye on it.  I'm glad that the areas in your armpits are getting better.  Keep an eye on them-- if you get any new bumps, come back so we can take a look at them.  You can stop using the Hibiclens!  Come back in about 3 months so we can check in.

## 2012-03-20 ENCOUNTER — Encounter: Payer: Self-pay | Admitting: Family Medicine

## 2012-03-20 ENCOUNTER — Ambulatory Visit (INDEPENDENT_AMBULATORY_CARE_PROVIDER_SITE_OTHER): Payer: PRIVATE HEALTH INSURANCE | Admitting: Family Medicine

## 2012-03-20 VITALS — BP 135/80 | HR 77 | Temp 98.0°F | Ht 67.0 in | Wt 148.0 lb

## 2012-03-20 DIAGNOSIS — N898 Other specified noninflammatory disorders of vagina: Secondary | ICD-10-CM | POA: Insufficient documentation

## 2012-03-20 DIAGNOSIS — L293 Anogenital pruritus, unspecified: Secondary | ICD-10-CM

## 2012-03-20 LAB — POCT WET PREP (WET MOUNT): Clue Cells Wet Prep Whiff POC: NEGATIVE

## 2012-03-20 MED ORDER — DESONIDE 0.05 % EX CREA: TOPICAL_CREAM | Freq: Two times a day (BID) | CUTANEOUS | Status: DC

## 2012-03-20 NOTE — Patient Instructions (Signed)
It was good to see you today.  I'm sorry you're itching so much!  I am prescribing you a cream to try to help decrease the inflammation and itching in that area.  Make sure that area is staying dry.  I want to see you back in 3-4 weeks so we can see if that area is looking any better.  If not, we may need to send a sample to the lab to see what exactly it is.

## 2012-03-20 NOTE — Progress Notes (Signed)
S: Pt comes in today for SDA for rash. She complains of 2 weeks of an itchy bottom that started acutely after she showered.  It is her vaginal area-- both inside and outside.  Has hemorrhoids but this is a different kind of itching.  Has never had this before.  Tried some vaseline, which helped a little but not that much.  No vaginal discharge or bleeding.  Hasn't actually seen a rash.  No itching anywhere else.    ROS: Per HPI  History  Smoking status  . Current Every Day Smoker -- 0.50 packs/day  . Types: Cigarettes  . Last Attempt to Quit: 06/15/2011  Smokeless tobacco  . Not on file    O:  Filed Vitals:   03/20/12 1023  BP: 135/80  Pulse: 77  Temp: 98 F (36.7 C)    Gen: NAD, very pleasant  GU: skin around labia and rectum hyperpigmented and thickened, c/w lichenification; creases of skin pink but without scaling, general area seems wet (pt denies urinary incontinence), no vaginal d/c, + cystocele with urinary incontinence with cough; no vaginal atrophy, no labial tightness or atrophy, no papules or pustules noted   A/P: 79 y.o. female p/w vaginal itching/irritation -See problem list -f/u in 3 weeks

## 2012-03-20 NOTE — Assessment & Plan Note (Addendum)
No yeast on wet prep going against intertrigo.  Will treat with low potency topical steroid to help with irritation and inflammatory changes.  Unlikely to be lichen sclerosis but would need to consider a punch biopsy if not improved after 3-4 weeks.

## 2012-04-09 ENCOUNTER — Encounter: Payer: Self-pay | Admitting: Family Medicine

## 2012-04-09 ENCOUNTER — Ambulatory Visit (INDEPENDENT_AMBULATORY_CARE_PROVIDER_SITE_OTHER): Payer: PRIVATE HEALTH INSURANCE | Admitting: Family Medicine

## 2012-04-09 VITALS — BP 130/75 | HR 73 | Temp 98.3°F | Ht 67.0 in | Wt 149.0 lb

## 2012-04-09 DIAGNOSIS — N898 Other specified noninflammatory disorders of vagina: Secondary | ICD-10-CM

## 2012-04-09 DIAGNOSIS — L293 Anogenital pruritus, unspecified: Secondary | ICD-10-CM

## 2012-04-09 NOTE — Assessment & Plan Note (Signed)
Resolved s/p topical steroid.  Advised pt to stop using this- if itching returns, may need punch bx to r/o lichen sclerosis.  Likely, itching was from irritant or contact dermatitis, leading to lichenification from itching/scratching.

## 2012-04-09 NOTE — Progress Notes (Signed)
S: Pt comes in today for follow up.  Patient was seen 2/21 for rash in vaginal/rectal area and started on a topical steroid cream.  Since that time, she feels like the itching has improved 100%.  Started getting better after 1 week of cream.  Is still using it now.  No vaginal discharge.  No new bumps or itching.  No issues or problems.     ROS: Per HPI  History  Smoking status  . Current Every Day Smoker -- 0.50 packs/day  . Types: Cigarettes  . Last Attempt to Quit: 06/15/2011  Smokeless tobacco  . Not on file    O:  Filed Vitals:   04/09/12 1001  BP: 130/75  Pulse: 73  Temp: 98.3 F (36.8 C)    Gen: NAD, very pleasant  GYN/skin exam deferred-- pt requested no exam today since doing better    A/P: 79 y.o. female p/w genital rash, now improved s/p steroids likely from contact or irritant dermatitis  -See problem list -f/u in PRN

## 2012-04-09 NOTE — Patient Instructions (Signed)
It was good to see you today.  I'm glad you're doing so much better!  STOP using the cream.  If the itching comes back when you stop the cream, come back and we will take a biopsy of the skin.  Otherwise, come back as needed!

## 2012-07-28 ENCOUNTER — Encounter (HOSPITAL_COMMUNITY): Payer: Self-pay | Admitting: Emergency Medicine

## 2012-07-28 DIAGNOSIS — Z8739 Personal history of other diseases of the musculoskeletal system and connective tissue: Secondary | ICD-10-CM | POA: Insufficient documentation

## 2012-07-28 DIAGNOSIS — F17200 Nicotine dependence, unspecified, uncomplicated: Secondary | ICD-10-CM | POA: Insufficient documentation

## 2012-07-28 DIAGNOSIS — Z862 Personal history of diseases of the blood and blood-forming organs and certain disorders involving the immune mechanism: Secondary | ICD-10-CM | POA: Insufficient documentation

## 2012-07-28 DIAGNOSIS — R252 Cramp and spasm: Secondary | ICD-10-CM | POA: Insufficient documentation

## 2012-07-28 DIAGNOSIS — Z8639 Personal history of other endocrine, nutritional and metabolic disease: Secondary | ICD-10-CM | POA: Insufficient documentation

## 2012-07-28 NOTE — ED Notes (Signed)
PT. REPORTS BILATERAL LOWER LEG CRAMPS ONSET THIS EVENING , NO CRAMPING AT TRIAGE/AMBULATORY.

## 2012-07-29 ENCOUNTER — Emergency Department (HOSPITAL_COMMUNITY)
Admission: EM | Admit: 2012-07-29 | Discharge: 2012-07-29 | Disposition: A | Discharge status: Home / Self Care | Payer: Medicaid Other | Attending: Emergency Medicine | Admitting: Emergency Medicine

## 2012-07-29 DIAGNOSIS — R252 Cramp and spasm: Secondary | ICD-10-CM

## 2012-07-29 LAB — POCT I-STAT, CHEM 8
BUN: 11 mg/dL (ref 6–23)
Calcium, Ion: 1.24 mmol/L (ref 1.13–1.30)
Chloride: 104 mEq/L (ref 96–112)
Creatinine, Ser: 0.9 mg/dL (ref 0.50–1.10)
Glucose, Bld: 124 mg/dL — ABNORMAL HIGH (ref 70–99)
HCT: 43 % (ref 36.0–46.0)
Hemoglobin: 14.6 g/dL (ref 12.0–15.0)
Potassium: 3.9 mEq/L (ref 3.5–5.1)
Sodium: 140 mEq/L (ref 135–145)
TCO2: 25 mmol/L (ref 0–100)

## 2012-07-29 NOTE — ED Provider Notes (Signed)
History    CSN: [id number] Arrival date & time 07/28/12  2336  First MD Initiated Contact with Patient 07/29/12 0051     Chief Complaint  Patient presents with  . Leg Pain   HPI  History provided by the patient. The patient is an 79 year old female with history of hypercholesterolemia and osteoarthritis who presents with complaints of pain in cramp bilateral lower legs and calf area. Symptoms began briefly before arrival while getting into bed. She reports having sharp cramping pains spine both legs. Symptoms seemed to improve after 5-10 minutes as she was on her way to the emergency room. She denies having similar symptoms before. She denies any injury or trauma to the area. She does report some increased activity early in the day with increased walking. No strenuous activity. She did not use any treatment for symptoms and they resolved on their own. Denies any other associated symptoms. No fever, chills or sweats. No weakness or numbness.    Past Medical History  Diagnosis Date  . Hypercholesteremia   . Hip osteoarthritis 09/24/2010   Past Surgical History  Procedure Laterality Date  . Incision and drainage perirectal abscess  06/16/2011    Procedure: IRRIGATION AND DEBRIDEMENT PERIRECTAL ABSCESS;  Surgeon: Robyne Askew, MD;  Location: [facility] OR;  Service: General;  Laterality: N/A;   Family History  Problem Relation Age of Onset  . Kidney disease Mother   . Cancer Sister     colon  . Cancer Brother     lung  . Cancer Sister     stomach   History  Substance Use Topics  . Smoking status: Current Every Day Smoker -- 0.50 packs/day    Types: Cigarettes    Last Attempt to Quit: 06/15/2011  . Smokeless tobacco: Not on file  . Alcohol Use: No   OB History   Grav Para Term Preterm Abortions TAB SAB Ect Mult Living                 Review of Systems  Respiratory: Negative for shortness of breath.   Cardiovascular: Negative for chest pain.  Neurological: Negative for  weakness and numbness.  All other systems reviewed and are negative.    Allergies  Sulfonamide derivatives  Home Medications  No current outpatient prescriptions on file. BP 108/65  Pulse 73  Temp(Src) 98 F (36.7 C) (Oral)  Resp 16  SpO2 97% Physical Exam  Nursing note and vitals reviewed. Constitutional: She is oriented to person, place, and time. She appears well-developed and well-nourished. No distress.  HENT:  Head: Normocephalic.  Cardiovascular: Normal rate and regular rhythm.   Pulmonary/Chest: Effort normal and breath sounds normal. No respiratory distress. She has no wheezes. She has no rales.  Abdominal: Soft.  Musculoskeletal: Normal range of motion. She exhibits no edema and no tenderness.  Neurological: She is alert and oriented to person, place, and time.  Skin: Skin is warm and dry. No rash noted.  Psychiatric: She has a normal mood and affect. Her behavior is normal.    ED Course  Procedures   Results for orders placed during the hospital encounter of 07/29/12  POCT I-STAT, CHEM 8      Result Value Range   Sodium 140  135 - 145 mEq/L   Potassium 3.9  3.5 - 5.1 mEq/L   Chloride 104  96 - 112 mEq/L   BUN 11  6 - 23 mg/dL   Creatinine, Ser 9.62  0.50 - 1.10 mg/dL  Glucose, Bld 124 (*) 70 - 99 mg/dL   Calcium, Ion 8.46  9.62 - 1.30 mmol/L   TCO2 25  0 - 100 mmol/L   Hemoglobin 14.6  12.0 - 15.0 g/dL   HCT 95.2  84.1 - 32.4 %       1. Muscle cramp     MDM  Patient seen and evaluated. Patient appears well without any pain or complaints at this time. Exam unremarkable. Lab electrolytes also unremarkable. We'll discharge to followup with PCP.  Bilateral upper calfs 5-62min only    Angus Seller, PA-C 07/30/12 0234

## 2012-07-30 NOTE — ED Provider Notes (Signed)
Medical screening examination/treatment/procedure(s) were performed by non-physician practitioner and as supervising physician I was immediately available for consultation/collaboration.   Laray Anger, DO 07/30/12 2014

## 2012-09-02 ENCOUNTER — Ambulatory Visit: Payer: PRIVATE HEALTH INSURANCE | Admitting: Family Medicine

## 2012-09-22 ENCOUNTER — Encounter: Payer: Self-pay | Admitting: Family Medicine

## 2012-09-22 ENCOUNTER — Ambulatory Visit (INDEPENDENT_AMBULATORY_CARE_PROVIDER_SITE_OTHER): Payer: Medicare Other | Admitting: Family Medicine

## 2012-09-22 VITALS — BP 120/65 | HR 65 | Temp 98.5°F | Ht 67.0 in | Wt 147.0 lb

## 2012-09-22 DIAGNOSIS — L293 Anogenital pruritus, unspecified: Secondary | ICD-10-CM

## 2012-09-22 DIAGNOSIS — N898 Other specified noninflammatory disorders of vagina: Secondary | ICD-10-CM

## 2012-09-22 LAB — POCT WET PREP (WET MOUNT): Clue Cells Wet Prep Whiff POC: NEGATIVE

## 2012-09-22 NOTE — Patient Instructions (Addendum)
I would like to get a biopsy of the area to see if we can figure out for sure what it is.  Make an appointment with me for a biopsy at your earliest convenience.

## 2012-09-26 NOTE — Assessment & Plan Note (Addendum)
Ulcerated lesion presented on exam that could be explanation for itching symptoms. Lichen sclerosis vs vulvar neoplasm.  - wet prep negative  - patient to schedule appointment for biopsy in office. Will do punch biopsy - hold on treatment until diagnosis is made.

## 2012-09-26 NOTE — Progress Notes (Signed)
Patient ID: Joann [name]    DOB: [birth year], 79 y.o.   MRN: [id number] --- Subjective:  Joann [name] is a 79 y.o.female with h/o HTN, peripheral neuropathy, hyperlipidemia who presents with vaginal itching.   - vaginal itching: ongoing for over 1 month. Itching, worst at night time. No associated discharge. Itching feels like it's inside vagina. She was evaluated for this in March. She has used steroid cream in the past which helped, but she was advised to stop it.  ROS: see HPI Past Medical History: reviewed and updated medications and allergies. Social History: Tobacco: 1/2 pack per day  Objective: Filed Vitals:   09/22/12 1402  BP: 120/65  Pulse: 65  Temp: 98.5 F (36.9 C)    Physical Examination:   General appearance - alert, well appearing, and in no distress Genital exam - ulcerated round lesion at 11o'clock of labia minora, irritation around introitus, cystocele present. Scant discharge

## 2012-10-02 ENCOUNTER — Ambulatory Visit: Payer: Medicaid Other | Admitting: Family Medicine

## 2013-05-26 ENCOUNTER — Encounter: Payer: Self-pay | Admitting: Emergency Medicine

## 2013-05-26 ENCOUNTER — Ambulatory Visit (INDEPENDENT_AMBULATORY_CARE_PROVIDER_SITE_OTHER): Payer: Medicare Other | Admitting: Emergency Medicine

## 2013-05-26 VITALS — BP 129/72 | HR 67 | Temp 97.2°F | Ht 67.0 in | Wt 154.0 lb

## 2013-05-26 DIAGNOSIS — L293 Anogenital pruritus, unspecified: Secondary | ICD-10-CM | POA: Diagnosis not present

## 2013-05-26 DIAGNOSIS — N898 Other specified noninflammatory disorders of vagina: Secondary | ICD-10-CM

## 2013-05-26 MED ORDER — CLOBETASOL PROPIONATE 0.05 % EX CREA: 1.0000 "application " | TOPICAL_CREAM | Freq: Two times a day (BID) | CUTANEOUS | Status: DC

## 2013-05-26 MED ORDER — CLOTRIMAZOLE 1 % EX CREA: 1.0000 "application " | TOPICAL_CREAM | Freq: Two times a day (BID) | CUTANEOUS | Status: DC

## 2013-05-26 NOTE — Patient Instructions (Addendum)
It was nice to meet you!  I think you may have some mild yeast on your skin.   It also looks like you might some something called lichen sclerosis.  Use the Clotrimazole cream twice a day for 1 week - this is for yeast. Use the Clobetasol cream twice a day until the itching improved, then gradually taper off to once every other day.  If there is no improvement in 2 weeks, please come back. Otherwise f/u in 1-2 months.

## 2013-05-26 NOTE — Progress Notes (Signed)
   Subjective:    Patient ID: Joann [name], female    DOB: 03/05/1927, 80 y.o.   MRN: [id number]  HPI Joann [name] is here for a SDA for vaginal itching.  Reports vaginal itching for the last month.  It has stayed constant.  Itching is located in groin and labia majora.  No vaginal discharge, vaginal bleeding.  No discomfort with sex.  No new soaps or lotions or detergents.  She has not tried anything.    No current outpatient prescriptions on file prior to visit.   No current facility-administered medications on file prior to visit.    I have reviewed and updated the following as appropriate: allergies and current medications SHx: current smoker    Review of Systems See  HPI    Objective:   Physical Exam BP 129/72  Pulse 67  Temp(Src) 97.2 F (36.2 C) (Oral)  Ht 5\' 7"  (1.702 m)  Wt 154 lb (69.854 kg)  BMI 24.11 kg/m2 Gen: alert, cooperative, NAD Pelvic: hyperpigmentation in bilateral groin fold; skin of labia majora appears slightly thin and shiny with some patchy hypopigmentation; excoriations noted on labia majora and groin folds      Assessment & Plan:

## 2013-05-26 NOTE — Assessment & Plan Note (Signed)
Exam most consistent with early lichen sclerosus.  May have some mild yeast in groin folds as well. Will treat with clotrimazole cream BID x1 week. Also clobetasol cream BID until itching resolved, then taper to 3x/week. F/u if no improvement in 2 weeks.  Would recommend biopsy at that time.

## 2013-08-17 ENCOUNTER — Ambulatory Visit (INDEPENDENT_AMBULATORY_CARE_PROVIDER_SITE_OTHER): Payer: Medicare Other | Admitting: Family Medicine

## 2013-08-17 ENCOUNTER — Encounter: Payer: Self-pay | Admitting: Family Medicine

## 2013-08-17 ENCOUNTER — Ambulatory Visit (HOSPITAL_COMMUNITY)
Admission: RE | Admit: 2013-08-17 | Discharge: 2013-08-17 | Disposition: A | Discharge status: Home / Self Care | Payer: Medicare Other | Source: Ambulatory Visit | Attending: Family Medicine | Admitting: Family Medicine

## 2013-08-17 VITALS — BP 122/67 | HR 75 | Temp 98.1°F | Ht 67.0 in | Wt 152.0 lb

## 2013-08-17 DIAGNOSIS — I700 Atherosclerosis of aorta: Secondary | ICD-10-CM | POA: Diagnosis not present

## 2013-08-17 DIAGNOSIS — R059 Cough, unspecified: Secondary | ICD-10-CM | POA: Insufficient documentation

## 2013-08-17 DIAGNOSIS — R05 Cough: Secondary | ICD-10-CM

## 2013-08-17 MED ORDER — GUAIFENESIN 200 MG PO TABS: 200.0000 mg | ORAL_TABLET | ORAL | Status: DC | PRN

## 2013-08-17 NOTE — Patient Instructions (Addendum)
Thank you for coming in,   We are going to get an x-ray. Pending on what that shows will determine if I need to give you any other medications.   I have prescribed guaifenesin to help loosen your mucous.   Please follow up with me in three weeks if you continue to have cough.    Please feel free to call with any questions or concerns at any time, at [phone]. --Dr. Jordan LikesSchmitz

## 2013-08-17 NOTE — Progress Notes (Signed)
   Subjective:    Patient ID: Joann [name], female    DOB: 11/03/1927,Joann [name] 80 y.o.   MRN: [id number]  HPI Joann FickOlive [name] is here for cough.  A cough for one week. No sick contacts. Doesn't report any environmental allergies. Felt hot one night but no reported fevers. Productive cough that is yellow mucous but no blood. Staying about the same. Lives at home. Cough during the day and night. No post nasal drip. Smoke since she was 80 yo. Will smoke around 1/2 PPD. Good appetite and no unintentional weight loss.   Current Outpatient Prescriptions on File Prior to Visit  Medication Sig Dispense Refill  . clobetasol cream (TEMOVATE) 0.05 % Apply 1 application topically 2 (two) times daily. Until itching improved, then taper to 3x a week.  30 g  3  . clotrimazole (LOTRIMIN) 1 % cream Apply 1 application topically 2 (two) times daily. For 1 week.  30 g  0   No current facility-administered medications on file prior to visit.   Review of Systems See HPI     Objective:   Physical Exam BP 122/67  Pulse 75  Temp(Src) 98.1 F (36.7 C) (Oral)  Ht 5\' 7"  (1.702 m)  Wt 152 lb (68.947 kg)  BMI 23.80 kg/m2 Gen: NAD, alert, cooperative with exam, well-appearing HEENT: NCAT, PERRL, clear conjunctiva, oropharynx clear, supple neck CV: RRR, good S1/S2, no murmur, no edema, capillary refill brisk  Resp: CTABL, no wheezes, non-labored        Assessment & Plan:

## 2013-08-18 ENCOUNTER — Telehealth: Payer: Self-pay | Admitting: *Deleted

## 2013-08-18 NOTE — Telephone Encounter (Signed)
Spoke with patient and informed her of below 

## 2013-08-18 NOTE — Telephone Encounter (Signed)
Message copied by Farrell OursEVANS, Meiah Zamudio K on Wed Aug 18, 2013  1:50 PM ------      Message from: Clare GandySCHMITZ, JEREMY E      Created: Wed Aug 18, 2013 12:10 PM       Please call patient and inform that her chest x-ray was normal. Continue current treatment and follow up in 3 weeks if not better. Thank you. ------

## 2013-08-19 DIAGNOSIS — R059 Cough, unspecified: Secondary | ICD-10-CM | POA: Insufficient documentation

## 2013-08-19 DIAGNOSIS — R05 Cough: Secondary | ICD-10-CM | POA: Insufficient documentation

## 2013-08-19 NOTE — Assessment & Plan Note (Signed)
No signs of allergies or URI. Concerning for any carcinoma given smoking history but no bright red blood. Good breath sounds on exam.  - Chest x-ray: no signs of pneumonia or mass  - guaifenesin  - f/u in 3 weeks in no improvement.  - discussed with Dr. Deirdre Priesthambliss

## 2014-06-22 ENCOUNTER — Ambulatory Visit (INDEPENDENT_AMBULATORY_CARE_PROVIDER_SITE_OTHER): Payer: Medicare Other | Admitting: Family Medicine

## 2014-06-22 ENCOUNTER — Encounter: Payer: Self-pay | Admitting: Family Medicine

## 2014-06-22 VITALS — Temp 98.3°F | Ht 67.0 in | Wt 154.0 lb

## 2014-06-22 DIAGNOSIS — I951 Orthostatic hypotension: Secondary | ICD-10-CM | POA: Diagnosis not present

## 2014-06-22 DIAGNOSIS — L298 Other pruritus: Secondary | ICD-10-CM | POA: Diagnosis not present

## 2014-06-22 DIAGNOSIS — N898 Other specified noninflammatory disorders of vagina: Secondary | ICD-10-CM

## 2014-06-22 LAB — BASIC METABOLIC PANEL WITH GFR
BUN: 10 mg/dL (ref 6–23)
CO2: 25 meq/L (ref 19–32)
Calcium: 9.3 mg/dL (ref 8.4–10.5)
Chloride: 102 meq/L (ref 96–112)
Creat: 0.78 mg/dL (ref 0.50–1.10)
Glucose, Bld: 95 mg/dL (ref 70–99)
Potassium: 4.1 meq/L (ref 3.5–5.3)
Sodium: 138 meq/L (ref 135–145)

## 2014-06-22 MED ORDER — CLOBETASOL PROPIONATE 0.05 % EX CREA: 1.0000 "application " | TOPICAL_CREAM | Freq: Two times a day (BID) | CUTANEOUS | Status: DC

## 2014-06-22 NOTE — Assessment & Plan Note (Signed)
Most likely hypovolemic hypernatremia from sweating/insensible loses.   - Check BMET - No red flags for cardiac or vasovagal origin - Replenish with Gatorade/Water, increase PO intake till MMM and UOP is clearish - F/U in 1-2 weeks.

## 2014-06-22 NOTE — Progress Notes (Signed)
Joann [name] is a 81 y.o. female who presents today for vaginal irritation and dizziness.  Dizziness - Ongoing now for since May 11th, does not have at rest while sitting, but does get it while standing.  Denies chest pain, palpitations, blurred vision, diplopia, SOB, DOE, edema.  Has not tried anything for it and denies radiating Sx, vertigo, tinnitus, or hearing loss.  Has hx of vertigo but has not had in over 20 years.  Does admit to decreased intake PO and urine yellow/orange in color.  Vaginal Itching - Ongoing now for about one month, states more severe itching, denies changes in color, moisturizer, soaps, detergents.  Previous hx of this x 1 about one year ago that improved with clobetasol.  Denies vaginal discharge or dysuria, dyspareunia, or constipation.    Past Medical History  Diagnosis Date  . Hypercholesteremia   . Hip osteoarthritis 09/24/2010    History  Smoking status  . Current Every Day Smoker -- 0.50 packs/day  . Types: Cigarettes  . Last Attempt to Quit: 06/15/2011  Smokeless tobacco  . Not on file    Family History  Problem Relation Age of Onset  . Kidney disease Mother   . Cancer Sister     colon  . Cancer Brother     lung  . Cancer Sister     stomach    Current Outpatient Prescriptions on File Prior to Visit  Medication Sig Dispense Refill  . clobetasol cream (TEMOVATE) 0.05 % Apply 1 application topically 2 (two) times daily. Until itching improved, then taper to 3x a week. 30 g 3  . clotrimazole (LOTRIMIN) 1 % cream Apply 1 application topically 2 (two) times daily. For 1 week. 30 g 0  . guaiFENesin 200 MG tablet Take 1 tablet (200 mg total) by mouth every 4 (four) hours as needed for cough or to loosen phlegm. 30 suppository 0   No current facility-administered medications on file prior to visit.    ROS: Per HPI.  All other systems reviewed and are negative.   Physical Exam Filed Vitals:   06/22/14 1442  Temp: 98.3 F (36.8 C)    Physical  Examination: General appearance - alert, well appearing, and in no distress HEENT: Dry mucous membranes, otherwise O/p clear  Chest - clear to auscultation, no wheezes, rales or rhonchi, symmetric air entry Heart - normal rate and regular rhythm, no murmurs noted GU: [name], CNA chaperone.   white, atrophic papules present along vulva    Chemistry      Component Value Date/Time   NA 140 07/29/2012 0239   K 3.9 07/29/2012 0239   CL 104 07/29/2012 0239   CO2 25 07/25/2011 0935   BUN 11 07/29/2012 0239   CREATININE 0.90 07/29/2012 0239   CREATININE 0.75 07/25/2011 0935      Component Value Date/Time   CALCIUM 9.5 07/25/2011 0935   ALKPHOS 49 06/21/2011 0545   AST 23 06/21/2011 0545   ALT 23 06/21/2011 0545   BILITOT 0.3 06/21/2011 0545

## 2014-06-22 NOTE — Patient Instructions (Signed)
Clobetasol Propionate skin cream What is this medicine? CLOBETASOL (kloe BAY ta sol) is a corticosteroid. It is used on the skin to treat itching, redness, and swelling caused by some skin conditions. This medicine may be used for other purposes; ask your health care provider or pharmacist if you have questions. COMMON BRAND NAME(S): Cormax, Embeline, Embeline E, Temovate, Temovate E What should I tell my health care provider before I take this medicine? They need to know if you have any of these conditions: -any type of active infection including measles, tuberculosis, herpes, or chickenpox -circulation problems or vascular disease -large areas of burned or damaged skin -rosacea -skin wasting or thinning -an unusual or allergic reaction to clobetasol, corticosteroids, other medicines, foods, dyes, or preservatives -pregnant or trying to get pregnant -breast-feeding How should I use this medicine? This medicine is for external use only. Do not take by mouth. Follow the directions on the prescription label. Wash your hands before and after use. Apply a thin film of medicine to the affected area. Do not cover with a bandage or dressing unless your doctor or health care professional tells you to. Do not get this medicine in your eyes. If you do, rinse out with plenty of cool tap water. It is important not to use more medicine than prescribed. Do not use your medicine more often than directed. To do so may increase the chance of side effects. Talk to your pediatrician regarding the use of this medicine in children. Special care may be needed. Elderly patients are more likely to have damaged skin through aging, and this may increase side effects. This medicine should only be used for brief periods and infrequently in older patients. Overdosage: If you think you have taken too much of this medicine contact a poison control center or emergency room at once. NOTE: This medicine is only for you. Do not  share this medicine with others. What if I miss a dose? If you miss a dose, use it as soon as you can. If it is almost time for your next dose, use only that dose. Do not use double or extra doses. What may interact with this medicine? Interactions are not expected. Do not use cosmetics or other skin care products on the treated area. This list may not describe all possible interactions. Give your health care provider a list of all the medicines, herbs, non-prescription drugs, or dietary supplements you use. Also tell them if you smoke, drink alcohol, or use illegal drugs. Some items may interact with your medicine. What should I watch for while using this medicine? Tell your doctor or health care professional if your symptoms do not get better within 2 weeks, or if you develop skin irritation from the medicine. Tell your doctor or health care professional if you are exposed to anyone with measles or chickenpox, or if you develop sores or blisters that do not heal properly. What side effects may I notice from receiving this medicine? Side effects that you should report to your doctor or health care professional as soon as possible: -allergic reactions like skin rash, itching or hives, swelling of the face, lips, or tongue -changes in vision -lack of healing of the skin condition -painful, red, pus filled blisters on the skin or in hair follicles -thinning of the skin with easy bruising Side effects that usually do not require medical attention (report to your doctor or health care professional if they continue or are bothersome): -burning, irritation of the skin -redness or   scaling of the skin This list may not describe all possible side effects. Call your doctor for medical advice about side effects. You may report side effects to FDA at 1-800-FDA-1088. Where should I keep my medicine? Keep out of the reach of children. Store at room temperature between 15 and 30 degrees C (59 and 86 degrees  F). Keep away from heat and direct light. Do not freeze. Throw away any unused medicine after the expiration date. NOTE: This sheet is a summary. It may not cover all possible information. If you have questions about this medicine, talk to your doctor, pharmacist, or health care provider.  2015, Elsevier/Gold Standard. (2007-04-22 16:56:45)  

## 2014-06-22 NOTE — Assessment & Plan Note (Signed)
most consistent with early lichen sclerosus Clobetasol cream BID until itching resolved, then taper to 3x/week. F/u if no improvement in 2 weeks w/ PCP.  Would recommend biopsy at that time.

## 2014-06-23 ENCOUNTER — Encounter: Payer: Self-pay | Admitting: Family Medicine

## 2014-07-08 ENCOUNTER — Encounter: Payer: Self-pay | Admitting: Family Medicine

## 2014-07-08 ENCOUNTER — Ambulatory Visit (INDEPENDENT_AMBULATORY_CARE_PROVIDER_SITE_OTHER): Payer: Medicare Other | Admitting: Family Medicine

## 2014-07-08 VITALS — BP 129/77 | HR 64 | Temp 97.9°F | Wt 142.0 lb

## 2014-07-08 DIAGNOSIS — R42 Dizziness and giddiness: Secondary | ICD-10-CM | POA: Diagnosis not present

## 2014-07-08 NOTE — Patient Instructions (Signed)
Thank you for coming in,   Most likely your dizziness if coming from sitting up to fast. So the best thing to do would be take your time before standing.   Please bring all of your medications with you to each visit.    Please feel free to call with any questions or concerns at any time, at [phone]. --Dr. Jordan Likes  Dizziness Dizziness is a common problem. It is a feeling of unsteadiness or light-headedness. You may feel like you are about to faint. Dizziness can lead to injury if you stumble or fall. A person of any age group can suffer from dizziness, but dizziness is more common in older adults. CAUSES  Dizziness can be caused by many different things, including:  Middle ear problems.  Standing for too long.  Infections.  An allergic reaction.  Aging.  An emotional response to something, such as the sight of blood.  Side effects of medicines.  Tiredness.  Problems with circulation or blood pressure.  Excessive use of alcohol or medicines, or illegal drug use.  Breathing too fast (hyperventilation).  An irregular heart rhythm (arrhythmia).  A low red blood cell count (anemia).  Pregnancy.  Vomiting, diarrhea, fever, or other illnesses that cause body fluid loss (dehydration).  Diseases or conditions such as Parkinson's disease, high blood pressure (hypertension), diabetes, and thyroid problems.  Exposure to extreme heat. DIAGNOSIS  Your health care provider will ask about your symptoms, perform a physical exam, and perform an electrocardiogram (ECG) to record the electrical activity of your heart. Your health care provider may also perform other heart or blood tests to determine the cause of your dizziness. These may include:  Transthoracic echocardiogram (TTE). During echocardiography, sound waves are used to evaluate how blood flows through your heart.  Transesophageal echocardiogram (TEE).  Cardiac monitoring. This allows your health care provider to monitor  your heart rate and rhythm in real time.  Holter monitor. This is a portable device that records your heartbeat and can help diagnose heart arrhythmias. It allows your health care provider to track your heart activity for several days if needed.  Stress tests by exercise or by giving medicine that makes the heart beat faster. TREATMENT  Treatment of dizziness depends on the cause of your symptoms and can vary greatly. HOME CARE INSTRUCTIONS   Drink enough fluids to keep your urine clear or pale yellow. This is especially important in very hot weather. In older adults, it is also important in cold weather.  Take your medicine exactly as directed if your dizziness is caused by medicines. When taking blood pressure medicines, it is especially important to get up slowly.  Rise slowly from chairs and steady yourself until you feel okay.  In the morning, first sit up on the side of the bed. When you feel okay, stand slowly while holding onto something until you know your balance is fine.  Move your legs often if you need to stand in one place for a long time. Tighten and relax your muscles in your legs while standing.  Have someone stay with you for 1-2 days if dizziness continues to be a problem. Do this until you feel you are well enough to stay alone. Have the person call your health care provider if he or she notices changes in you that are concerning.  Do not drive or use heavy machinery if you feel dizzy.  Do not drink alcohol. SEEK IMMEDIATE MEDICAL CARE IF:   Your dizziness or light-headedness gets worse.  You feel nauseous or vomit.  You have problems talking, walking, or using your arms, hands, or legs.  You feel weak.  You are not thinking clearly or you have trouble forming sentences. It may take a friend or family member to notice this.  You have chest pain, abdominal pain, shortness of breath, or sweating.  Your vision changes.  You notice any bleeding.  You have  side effects from medicine that seems to be getting worse rather than better. MAKE SURE YOU:   Understand these instructions.  Will watch your condition.  Will get help right away if you are not doing well or get worse. Document Released: 07/10/2000 Document Revised: 01/19/2013 Document Reviewed: 08/03/2010 [facility] Patient Information 2015 [place], [place]. This information is not intended to replace advice given to you by your health care provider. Make sure you discuss any questions you have with your health care provider.

## 2014-07-08 NOTE — Progress Notes (Signed)
    Subjective   Libertee Bendick is a 81 y.o. female that presents for a same day visit  DIZZINESS  Feeling dizzy for about a month.  Dizziness is only when she stands up. intermittent in nature. She sits on the bed about 5-10 min. And it resolves.  Feels like room spins: yes Lightheadedness when stands: yes Palpitations or heart racing: not sure  Prior dizziness: vertigo diagnosed at 81 yo  Medications tried: previously on medication for vertigo  Taking blood thinners: no   Symptoms Hearing Loss: no Ear Pain or fullness: no Nausea or vomiting: no Vision difficulty or double vision: no Falls: no Head trauma: no Weakness in arm or leg: has arthritis in her left knee  Speaking problems: no Headache: no  Smoking Status noted  History  Substance Use Topics  . Smoking status: Current Every Day Smoker -- 0.50 packs/day    Types: Cigarettes    Last Attempt to Quit: 06/15/2011  . Smokeless tobacco: Not on file  . Alcohol Use: No    ROS Per HPI  Objective   BP 129/77 mmHg  Pulse 64  Temp(Src) 97.9 F (36.6 C) (Oral)  Wt 142 lb (64.411 kg)  General: NAD, alert, cooperative with exam, Respiratory/Chest: CTABL, no wheezes, non-labored Cardiovascular: RRR, good S1/S2, no murmur,  capillary refill brisk  Extremities: no edema Neuro: CN 2-12 intact, normal finger to nose and rapid alternating hands movements. Normal gait   Orthostatics  Lying 113/60 HR 59 Sitting 117/89 HR 69 Standing 118/73 HR 78  Assessment and Plan   Please refer to problem based charting of assessment and plan

## 2014-07-10 ENCOUNTER — Encounter: Payer: Self-pay | Admitting: Family Medicine

## 2014-07-10 DIAGNOSIS — R42 Dizziness and giddiness: Secondary | ICD-10-CM

## 2014-07-10 HISTORY — DX: Dizziness and giddiness: R42

## 2014-07-10 NOTE — Assessment & Plan Note (Signed)
Has been seen recently for similar complaint. Most likely related to orthostatic Hypotension  - Encouraged fluids  - she lives at home alone. May not "remember" to drink as daughter reports worsening memory loss  - advised to f/u with PCP and may need an evaluation in Geriatric clinic

## 2014-07-29 ENCOUNTER — Encounter: Payer: Medicare Other | Admitting: Family Medicine

## 2014-08-23 ENCOUNTER — Encounter: Payer: Medicare Other | Admitting: Family Medicine

## 2014-09-08 ENCOUNTER — Ambulatory Visit
Admission: RE | Admit: 2014-09-08 | Discharge: 2014-09-08 | Disposition: A | Discharge status: Home / Self Care | Payer: Medicare Other | Source: Ambulatory Visit | Attending: Family Medicine | Admitting: Family Medicine

## 2014-09-08 ENCOUNTER — Ambulatory Visit (INDEPENDENT_AMBULATORY_CARE_PROVIDER_SITE_OTHER): Payer: Medicare Other | Admitting: Family Medicine

## 2014-09-08 ENCOUNTER — Encounter: Payer: Self-pay | Admitting: Family Medicine

## 2014-09-08 VITALS — BP 127/73 | HR 77 | Temp 98.1°F | Ht 67.0 in | Wt 142.8 lb

## 2014-09-08 DIAGNOSIS — M25562 Pain in left knee: Secondary | ICD-10-CM | POA: Diagnosis not present

## 2014-09-08 NOTE — Patient Instructions (Signed)
Go get xrays Try tylenol, not advil Ice may help Gave prescription for rolling walker Follow up if not better next week Return sooner if you have big swelling, redness, fevers, etc  Be well, Dr. Pollie Meyer

## 2014-09-08 NOTE — Progress Notes (Signed)
Patient ID: Joann [name], female   DOB: [birth year], 81 y.o.   MRN: [id number]  HPI:  Pt presents for a same day appointment to discuss left knee pain.  Started having pain on Sunday night. Monday it felt better. No fevers. No injuries. No swelling. Has taken advil liquid gel which hasn't helped. Does not take any medicines regularly. Daughter accompanies her and also requests rx for rolling walker with seat for patient so that she can rest while walking if she gets tired.  ROS: See HPI  PMFSH: hx HLD, chronic L knee pain, osteopenia  PHYSICAL EXAM: BP 127/73 mmHg  Pulse 77  Temp(Src) 98.1 F (36.7 C) (Oral)  Ht  (1.702 m)  Wt 142 lb 12.8 oz (64.774 kg)  BMI 22.36 kg/m2 Gen: NAD, pleasant, cooperative Ext: bilateral knees without effusion. nontender to palpation. Full active ROM of left knee, with some pain. MCL & LCL intact to varus and valgus stressing. Good strength with L knee extension & flexion but limited by pain.  ASSESSMENT/PLAN:  1. L knee pain - suspect flare of OA. No signs of septic joint. hx of documented arthritis on prior xrays. Has not had any images done in several years. Discussed treatment options with patient and daughter, including joint injection or PO meds. They prefer oral medication. Recommend tylenol (not NSAID's due to age). Can try ice on the knee. Will send for xrays. F/u if not better, at which time would consider steroid injection. rx written for rolling walker with seat. Return precautions discussed.  FOLLOW UP: F/u as needed if symptoms worsen or do not improve.   [place] J. Pollie Meyer, MD [facility]

## 2014-09-09 ENCOUNTER — Encounter: Payer: Self-pay | Admitting: Family Medicine

## 2014-09-10 ENCOUNTER — Encounter (HOSPITAL_COMMUNITY): Payer: Self-pay | Admitting: Emergency Medicine

## 2014-09-10 ENCOUNTER — Emergency Department (HOSPITAL_COMMUNITY)
Admission: EM | Admit: 2014-09-10 | Discharge: 2014-09-10 | Disposition: A | Discharge status: Home / Self Care | Payer: Medicare Other | Attending: Emergency Medicine | Admitting: Emergency Medicine

## 2014-09-10 DIAGNOSIS — R42 Dizziness and giddiness: Secondary | ICD-10-CM | POA: Diagnosis not present

## 2014-09-10 DIAGNOSIS — Z79899 Other long term (current) drug therapy: Secondary | ICD-10-CM | POA: Insufficient documentation

## 2014-09-10 DIAGNOSIS — Z8639 Personal history of other endocrine, nutritional and metabolic disease: Secondary | ICD-10-CM | POA: Diagnosis not present

## 2014-09-10 DIAGNOSIS — Z720 Tobacco use: Secondary | ICD-10-CM | POA: Diagnosis not present

## 2014-09-10 DIAGNOSIS — M179 Osteoarthritis of knee, unspecified: Secondary | ICD-10-CM | POA: Diagnosis not present

## 2014-09-10 DIAGNOSIS — M25562 Pain in left knee: Secondary | ICD-10-CM | POA: Diagnosis present

## 2014-09-10 DIAGNOSIS — M1712 Unilateral primary osteoarthritis, left knee: Secondary | ICD-10-CM

## 2014-09-10 MED ORDER — ACETAMINOPHEN 325 MG PO TABS
650.0000 mg | ORAL_TABLET | Freq: Once | ORAL | Status: AC
  Administered 2014-09-10: 650 mg via ORAL
  Filled 2014-09-10: qty 2

## 2014-09-10 NOTE — ED Notes (Signed)
Pt reports chronic left knee pain which has worsened in intensity x 1 week. Pt denies any injury. Pt was evaluated by her md 2 days ago and had imaging done but does not know the results. Pt alert x4. NAD at this time.

## 2014-09-10 NOTE — ED Provider Notes (Signed)
CSN: [id number]     Arrival date & time 09/10/14  0719 History   First MD Initiated Contact with Patient 09/10/14 [phone]     Chief Complaint  Patient presents with  . Knee Pain   History is provided by Joann [name] and her daughter.   HPI  Joann [name] is an 81 year old female presenting today with left knee pain. This is a chronic problem. She has had years of left knee pain but reports that it has gotten more severe over the past week. She was seen by her family medicine doctor two days ago for this problem and instructed to use tylenol and ice. She had an xray of her left knee taken at this time which shows osteoarthritis of the knee. The pain is sharp, localized in the center of her knee and she only feels it when she is walking on it. She uses a walker to get around the house 2/2 to pain. She reports occasionally feeling instability in the knee. No recent falls or trauma to the knee. Denies fever, swelling, redness or warmth of the joint, calf pain, shortness of breath or chest pain. Pt reports feeling lightheaded when she stands up in the morning. This is another chronic problem which she sees family medicine for. She reports no feelings of dizziness or lightheadedness currently. Denies headache, syncope, abdominal pain, nausea, vomiting, difficulty urinating and changes in bowel movements.   Pt's daughter reports that Joann [name] lives alone. She has concerns that she is not icing her knee or taking tylenol. She reports that [name] was up and walking using her walker when she picked her up this AM. Pt and daughter do not want narcotic pain medicine for pain control because [name] lives alone.   Past Medical History  Diagnosis Date  . Hypercholesteremia   . Hip osteoarthritis 09/24/2010   Past Surgical History  Procedure Laterality Date  . Incision and drainage perirectal abscess  06/16/2011    Procedure: IRRIGATION AND DEBRIDEMENT PERIRECTAL ABSCESS;  Surgeon: Robyne Askew, MD;  Location: [facility] OR;   Service: General;  Laterality: N/A;   Family History  Problem Relation Age of Onset  . Kidney disease Mother   . Cancer Sister     colon  . Cancer Brother     lung  . Cancer Sister     stomach   Social History  Substance Use Topics  . Smoking status: Current Every Day Smoker -- 0.50 packs/day    Types: Cigarettes    Last Attempt to Quit: 06/15/2011  . Smokeless tobacco: None  . Alcohol Use: No   OB History    No data available     Review of Systems  Constitutional: Negative for fever.  Respiratory: Negative for cough and shortness of breath.   Cardiovascular: Negative for chest pain and leg swelling.  Gastrointestinal: Negative for nausea, vomiting and abdominal pain.  Genitourinary: Negative for difficulty urinating.  Musculoskeletal: Positive for arthralgias and gait problem. Negative for myalgias and joint swelling.  Skin: Negative for color change.  Neurological: Positive for light-headedness. Negative for dizziness, syncope and headaches.      Allergies  Sulfonamide derivatives  Home Medications   Prior to Admission medications   Medication Sig Start Date End Date Taking? Authorizing Provider  clobetasol cream (TEMOVATE) 0.05 % Apply 1 application topically 2 (two) times daily. Until itching improved, then taper to 3x a week. 06/22/14   Briscoe Deutscher, DO  clotrimazole (LOTRIMIN) 1 %  cream Apply 1 application topically 2 (two) times daily. For 1 week. 05/26/13   Charm Rings, MD  guaiFENesin 200 MG tablet Take 1 tablet (200 mg total) by mouth every 4 (four) hours as needed for cough or to loosen phlegm. 08/17/13   Myra Rude, MD   BP 107/62 mmHg  Pulse 65  Temp(Src) 97.8 F (36.6 C) (Oral)  Resp 18  SpO2 100% Physical Exam  Constitutional: She is oriented to person, place, and time. She appears well-developed and well-nourished. No distress.  HENT:  Head: Normocephalic and atraumatic.  Cardiovascular: Normal rate and regular rhythm.   Pulmonary/Chest:  Effort normal and breath sounds normal.  Abdominal: Soft. She exhibits no distension. There is no tenderness.  Musculoskeletal:  Crepitations with flex/extend of left knee. No tenderness to palpation of left knee. No edema of left knee. No effusion present. No bakers cyst. ROM of left knee intact. No MCL/LCL laxity. Negative anterior drawer.  ROM of left hip intact. No edema of lower extremities present.   Neurological: She is alert and oriented to person, place, and time.  Sensation intact over left knee and lower extremity. 5/5 strength of left knee and ankle  Skin: Skin is warm and dry.  No erythema or wound over left knee.  Psychiatric: She has a normal mood and affect.    ED Course  Procedures (including critical care time) Labs Review Labs Reviewed - No data to display  Imaging Review Dg Knee Bilateral Standing Ap  09/08/2014   CLINICAL DATA:  LEFT knee pain. Chronic knee pain. Osteoarthritis of the knee.  EXAM: LEFT KNEE - COMPLETE 4+ VIEW; BILATERAL KNEES STANDING - 1 VIEW  COMPARISON:  01/24/2009.  FINDINGS: Standing frontal projections of both knees demonstrate RIGHT-greater-than-LEFT tricompartmental osteoarthritis. Bilateral chondrocalcinosis is present. This may represent senile chondrocalcinosis or CPPD arthropathy. LEFT knee medial and lateral compartment osteoarthritis is mild. Patellofemoral osteoarthritis is mild to moderate. Atherosclerosis is present. No destructive osseous lesions. No fracture. When comparing to the prior exam from 2010, osteoarthritis has progressed and to the chondrocalcinosis has also progressed.  IMPRESSION: 1. RIGHT-greater-than-LEFT bilateral tricompartmental osteoarthritis of the knee. 2. Patellofemoral compartment predominant tricompartmental LEFT knee osteoarthritis. 3. Chondrocalcinosis of the menisci. In this age group, this is probably senile degenerative chondrocalcinosis but could also be associated with CPPD arthropathy.   Electronically Signed    By: Andreas Newport M.D.   On: 09/08/2014 16:48   Dg Knee 4 Views W/patella Left  09/08/2014   CLINICAL DATA:  LEFT knee pain. Chronic knee pain. Osteoarthritis of the knee.  EXAM: LEFT KNEE - COMPLETE 4+ VIEW; BILATERAL KNEES STANDING - 1 VIEW  COMPARISON:  01/24/2009.  FINDINGS: Standing frontal projections of both knees demonstrate RIGHT-greater-than-LEFT tricompartmental osteoarthritis. Bilateral chondrocalcinosis is present. This may represent senile chondrocalcinosis or CPPD arthropathy. LEFT knee medial and lateral compartment osteoarthritis is mild. Patellofemoral osteoarthritis is mild to moderate. Atherosclerosis is present. No destructive osseous lesions. No fracture. When comparing to the prior exam from 2010, osteoarthritis has progressed and to the chondrocalcinosis has also progressed.  IMPRESSION: 1. RIGHT-greater-than-LEFT bilateral tricompartmental osteoarthritis of the knee. 2. Patellofemoral compartment predominant tricompartmental LEFT knee osteoarthritis. 3. Chondrocalcinosis of the menisci. In this age group, this is probably senile degenerative chondrocalcinosis but could also be associated with CPPD arthropathy.   Electronically Signed   By: Andreas Newport M.D.   On: 09/08/2014 16:48   I, Joseph Pierini Maddisen Vought, personally reviewed and evaluated these images and lab results as part of  my medical decision-making.   EKG Interpretation None      MDM   Final diagnoses:  None    1. Left knee OA - Tylenol 650 mg given in ED - Advised to take OTC tylenol for pain relief - Ice and rest knee - Follow up with Dr. Lajoyce Corners of [facility] early next week - Return to ED with swelling, redness or warmth of left knee, fever, inability to move the knee, syncope, chest pain, shortness of breath or further worsening of symptoms    Alveta Heimlich, PA-C 09/10/14 0945  Margarita Grizzle, MD 09/11/14 1611

## 2014-09-10 NOTE — ED Notes (Signed)
PA at the bedside.

## 2014-09-10 NOTE — Discharge Instructions (Signed)
-   Take OTC tylenol for knee pain - Ice knee at home - Rest and elevate knee - Call Dr. Lajoyce Corners at [facility] for appointment - Return to ED with redness, swelling or warmth of joint develops, fever, inability to move the knee or further worsening of symptoms

## 2014-09-20 ENCOUNTER — Telehealth: Payer: Self-pay | Admitting: *Deleted

## 2014-09-20 ENCOUNTER — Ambulatory Visit (INDEPENDENT_AMBULATORY_CARE_PROVIDER_SITE_OTHER): Payer: Medicare Other | Admitting: Family Medicine

## 2014-09-20 VITALS — BP 142/68 | HR 69 | Temp 98.1°F | Ht 67.0 in | Wt 139.0 lb

## 2014-09-20 DIAGNOSIS — N760 Acute vaginitis: Secondary | ICD-10-CM | POA: Diagnosis not present

## 2014-09-20 DIAGNOSIS — M25562 Pain in left knee: Secondary | ICD-10-CM | POA: Diagnosis not present

## 2014-09-20 MED ORDER — CLOTRIMAZOLE 1 % VA CREA: 1.0000 | TOPICAL_CREAM | Freq: Every day | VAGINAL | Status: DC

## 2014-09-20 MED ORDER — METHYLPREDNISOLONE ACETATE 40 MG/ML IJ SUSP
40.0000 mg | Freq: Once | INTRAMUSCULAR | Status: AC
  Administered 2014-09-20: 40 mg via INTRA_ARTICULAR

## 2014-09-20 MED ORDER — CLOTRIMAZOLE 2 % VA CREA: 1.0000 | TOPICAL_CREAM | Freq: Every day | VAGINAL | Status: DC

## 2014-09-20 NOTE — Assessment & Plan Note (Signed)
Joint aspiration and steroid injection performed today, with improvement in symptoms. Synovial fluid appropriate in appearance (yellow and clear) so doubt infectious etiology. Will send for culture and cell count/crystal analysis as it may guide future therapy. F/u as already scheduled with PCP in a few weeks.

## 2014-09-20 NOTE — Patient Instructions (Addendum)
Drained fluid off knee and did steroid injection  Follow up with Dr. Richarda Blade in a few weeks to make sure things are getting better  Be well, Dr. Pollie Meyer   Knee Injection Joint injections are shots. Your caregiver will place a needle into your knee joint. The needle is used to put medicine into the joint. These shots can be used to help treat different painful knee conditions such as osteoarthritis, bursitis, local flare-ups of rheumatoid arthritis, and pseudogout. Anti-inflammatory medicines such as corticosteroids and anesthetics are the most common medicines used for joint and soft tissue injections.  PROCEDURE  The skin over the kneecap will be cleaned with an antiseptic solution.  Your caregiver will inject a small amount of a local anesthetic (a medicine like Novocaine) just under the skin in the area that was cleaned.  After the area becomes numb, a second injection is done. This second injection usually includes an anesthetic and an anti-inflammatory medicine called a steroid or cortisone. The needle is carefully placed in between the kneecap and the knee, and the medicine is injected into the joint space.  After the injection is done, the needle is removed. Your caregiver may place a bandage over the injection site. The whole procedure takes no more than a couple of minutes. BEFORE THE PROCEDURE  Wash all of the skin around the entire knee area. Try to remove any loose, scaling skin. There is no other specific preparation necessary unless advised otherwise by your caregiver. LET YOUR CAREGIVER KNOW ABOUT:   Allergies.  Medications taken including herbs, eye drops, over the counter medications, and creams.  Use of steroids (by mouth or creams).  Possible pregnancy, if applicable.  Previous problems with anesthetics or Novocaine.  History of blood clots (thrombophlebitis).  History of bleeding or blood problems.  Previous surgery.  Other health problems. RISKS AND  COMPLICATIONS Side effects from cortisone shots are rare. They include:   Slight bruising of the skin.  Shrinkage of the normal fatty tissue under the skin where the shot was given.  Increase in pain after the shot.  Infection.  Weakening of tendons or tendon rupture.  Allergic reaction to the medicine.  Diabetics may have a temporary increase in their blood sugar after a shot.  Cortisone can temporarily weaken the immune system. While receiving these shots, you should not get certain vaccines. Also, avoid contact with anyone who has chickenpox or measles. Especially if you have never had these diseases or have not been previously immunized. Your immune system may not be strong enough to fight off the infection while the cortisone is in your system. AFTER THE PROCEDURE   You can go home after the procedure.  You may need to put ice on the joint 15-20 minutes every 3 or 4 hours until the pain goes away.  You may need to put an elastic bandage on the joint. HOME CARE INSTRUCTIONS   Only take over-the-counter or prescription medicines for pain, discomfort, or fever as directed by your caregiver.  You should avoid stressing the joint. Unless advised otherwise, avoid activities that put a lot of pressure on a knee joint, such as:  Jogging.  Bicycling.  Recreational climbing.  Hiking.  Laying down and elevating the leg/knee above the level of your heart can help to minimize swelling. SEEK MEDICAL CARE IF:   You have repeated or worsening swelling.  There is drainage from the puncture area.  You develop red streaking that extends above or below the site where the  needle was inserted. SEEK IMMEDIATE MEDICAL CARE IF:   You develop a fever.  You have pain that gets worse even though you are taking pain medicine.  The area is red and warm, and you have trouble moving the joint. MAKE SURE YOU:   Understand these instructions.  Will watch your condition.  Will get help  right away if you are not doing well or get worse. Document Released: 04/07/2006 Document Revised: 04/08/2011 Document Reviewed: 01/02/2007 [facility] Patient Information 2015 [place], [place]. This information is not intended to replace advice given to you by your health care provider. Make sure you discuss any questions you have with your health care provider.

## 2014-09-20 NOTE — Telephone Encounter (Signed)
Received a call from [facility] pharmacy stating the clotrimazole 1% ordered is not available.  They carry clotrimazole 2% vaginal cream.  If ok to change to the 2%, please send new Rx. Fax: [phone] or phone [phone].  Clovis Pu, RN

## 2014-09-20 NOTE — Progress Notes (Signed)
Patient ID: Joann [name], female   DOB: July 14, 1927, 81 y.o.   MRN: [id number]  HPI:  F/u L knee pain - last saw patient for this on 8/11 for complaints of L knee pain. Had xrays done showing bilateral osteoarthritis. L knee has continued to be painful, not responsive to tylenol. Patient limping with walking. Now states she is ready to have a steroid injection done. No fevers. Eating and drinking normally. No redness of knee. It is swollen.  Also has had rash on vulva for about a week. It's itchy and painful. No dysuria.   ROS: See HPI.  PMFSH: hx HLD, osteopenia, vitamin D deficiency  PHYSICAL EXAM: BP 142/68 mmHg  Pulse 69  Temp(Src) 98.1 F (36.7 C) (Oral)  Ht  (1.702 m)  Wt 139 lb (63.05 kg)  BMI 21.77 kg/m2 Gen: NAD, pleasant, cooperative GU: external female genitalia normal in appearance, except for adherent white discharge vulvar mucosal areas, localizing to area of discomfort.  Ext: L knee with moderate effusion and mild warmth. No erythema. Full active and passive ROM of knee. MCL & LCL intact to varus & valgus stressing bilaterally. Joint line tenderness present on medial aspect of knee.  PROCEDURE NOTE:  Knee Aspiration and Injection Patient verbally consented; risks (including rare risk of infection), benefits, and alternatives explained. Patient prepped with betadine. 2 cc of 1% Lidocaine used in wheal then injected Subcutaneous fashion on lateral approach. Under sterile conditions with patient reclining, 18 gauge needle used via lateral approach to aspirate 20 cc of straw colored fluid with some bloody contaminant. Upon attempting to remove large aspiration syringe and replace with steroid-containing syringe, 18g needle was prematurely withdrawn from knee, and steroid injection procedure abandoned. The area of this procedure was then cleansed and covered with a bandaid, with good hemostasis. Pt reported symptomatic improvement after aspiration.  Then, patient was seated on  exam table. L knee was prepped with alcohol swab. Utilizing anterolateral approach, patient's left knee was injected intraarticularly with mixture of 1cc of depomedrol /mL and 4cc of 2% lidocaine without epinephrine. Patient tolerated the procedure well without immediate complications. Patient again reported continued improvement in symptoms and ambulated safely after the procedure.  ASSESSMENT/PLAN:  KNEE PAIN, LEFT, CHRONIC Joint aspiration and steroid injection performed today, with improvement in symptoms. Synovial fluid appropriate in appearance (yellow and clear) so doubt infectious etiology. Will send for culture and cell count/crystal analysis as it may guide future therapy. F/u as already scheduled with PCP in a few weeks.   Vaginal/vulval rash - appears yeast. rx clotrimazole vaginal cream. F/u if not improving.  FOLLOW UP: F/u in a few weeks with PCP for L knee pain.  [place] J. Pollie Meyer, MD [facility]

## 2014-09-20 NOTE — Telephone Encounter (Signed)
Rx for clotrimazole 2% sent in.  Please call patient and let her know, and advise she should use this at night for 3 days.  Latrelle Dodrill, MD

## 2014-09-21 LAB — SYNOVIAL CELL COUNT + DIFF, W/ CRYSTALS
Crystals, Fluid: NONE SEEN
Eosinophils-Synovial: 0 % (ref 0–1)
Lymphocytes-Synovial Fld: 58 % — ABNORMAL HIGH (ref 0–20)
Monocyte/Macrophage: 15 % — ABNORMAL LOW (ref 50–90)
Neutrophil, Synovial: 27 % — ABNORMAL HIGH (ref 0–25)
WBC, Synovial: 375 cu mm — ABNORMAL HIGH (ref 0–200)

## 2014-09-22 NOTE — Telephone Encounter (Signed)
Left message for patient to return call.

## 2014-09-24 LAB — BODY FLUID CULTURE
Gram Stain: NONE SEEN
Organism ID, Bacteria: NO GROWTH

## 2014-09-27 ENCOUNTER — Encounter: Payer: Self-pay | Admitting: Family Medicine

## 2014-09-27 ENCOUNTER — Ambulatory Visit (INDEPENDENT_AMBULATORY_CARE_PROVIDER_SITE_OTHER): Payer: Medicare Other | Admitting: Family Medicine

## 2014-09-27 VITALS — BP 124/73 | HR 75 | Temp 98.3°F | Ht 67.0 in | Wt 138.5 lb

## 2014-09-27 DIAGNOSIS — Z862 Personal history of diseases of the blood and blood-forming organs and certain disorders involving the immune mechanism: Secondary | ICD-10-CM

## 2014-09-27 DIAGNOSIS — R413 Other amnesia: Secondary | ICD-10-CM

## 2014-09-27 DIAGNOSIS — M25562 Pain in left knee: Secondary | ICD-10-CM

## 2014-09-27 DIAGNOSIS — Z0000 Encounter for general adult medical examination without abnormal findings: Secondary | ICD-10-CM | POA: Diagnosis not present

## 2014-09-27 DIAGNOSIS — E559 Vitamin D deficiency, unspecified: Secondary | ICD-10-CM

## 2014-09-27 LAB — CBC
HCT: 41.1 % (ref 36.0–46.0)
Hemoglobin: 14.3 g/dL (ref 12.0–15.0)
MCH: 32.1 pg (ref 26.0–34.0)
MCHC: 34.8 g/dL (ref 30.0–36.0)
MCV: 92.4 fL (ref 78.0–100.0)
MPV: 8.3 fL — ABNORMAL LOW (ref 8.6–12.4)
Platelets: 270 10*3/uL (ref 150–400)
RBC: 4.45 MIL/uL (ref 3.87–5.11)
RDW: 14.1 % (ref 11.5–15.5)
WBC: 4.6 10*3/uL (ref 4.0–10.5)

## 2014-09-27 MED ORDER — CHOLECALCIFEROL 1.25 MG (50000 UT) PO TABS: 1.0000 | ORAL_TABLET | ORAL | Status: DC

## 2014-09-27 NOTE — Patient Instructions (Signed)

## 2014-09-28 LAB — VITAMIN D 25 HYDROXY (VIT D DEFICIENCY, FRACTURES): Vit D, 25-Hydroxy: 14 ng/mL — ABNORMAL LOW (ref 30–100)

## 2014-10-05 DIAGNOSIS — Z862 Personal history of diseases of the blood and blood-forming organs and certain disorders involving the immune mechanism: Secondary | ICD-10-CM

## 2014-10-05 DIAGNOSIS — R413 Other amnesia: Secondary | ICD-10-CM | POA: Insufficient documentation

## 2014-10-05 HISTORY — DX: Personal history of diseases of the blood and blood-forming organs and certain disorders involving the immune mechanism: Z86.2
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
## 2014-10-05 NOTE — Assessment & Plan Note (Signed)
Iron def in past, not checked recently - CBC today

## 2014-10-05 NOTE — Assessment & Plan Note (Signed)
History of vit D deficiency, not checked in several years, not on supplementation now - restart 50k units weekly x 8 weeks - recheck today

## 2014-10-11 ENCOUNTER — Telehealth: Payer: Self-pay | Admitting: *Deleted

## 2014-10-11 NOTE — Telephone Encounter (Signed)
-----   Message from Abram Sander, MD sent at 09/28/2014 12:15 PM EDT ----- Please notify patient's daughter Mitzi Davenport) that her vitamin D level is quite low as we discussed and she should take the supplement I prescribed. She is not anemic, her other blood test was normal. Please also remind her of her appointment in Geriatric clinic on October 20th at 2:30. Thank you!

## 2014-10-11 NOTE — Telephone Encounter (Signed)
Pt daughter was informed. Deseree Bruna Potter, CMA

## 2014-10-14 ENCOUNTER — Emergency Department (HOSPITAL_COMMUNITY): Payer: Medicare Other

## 2014-10-14 ENCOUNTER — Encounter (HOSPITAL_COMMUNITY): Payer: Self-pay | Admitting: Emergency Medicine

## 2014-10-14 ENCOUNTER — Emergency Department (HOSPITAL_COMMUNITY)
Admission: EM | Admit: 2014-10-14 | Discharge: 2014-10-14 | Disposition: A | Discharge status: Home / Self Care | Payer: Medicare Other | Attending: Emergency Medicine | Admitting: Emergency Medicine

## 2014-10-14 DIAGNOSIS — E782 Mixed hyperlipidemia: Secondary | ICD-10-CM | POA: Diagnosis not present

## 2014-10-14 DIAGNOSIS — N390 Urinary tract infection, site not specified: Secondary | ICD-10-CM | POA: Diagnosis not present

## 2014-10-14 DIAGNOSIS — D72819 Decreased white blood cell count, unspecified: Secondary | ICD-10-CM | POA: Insufficient documentation

## 2014-10-14 DIAGNOSIS — R42 Dizziness and giddiness: Secondary | ICD-10-CM | POA: Diagnosis not present

## 2014-10-14 DIAGNOSIS — R531 Weakness: Secondary | ICD-10-CM | POA: Diagnosis present

## 2014-10-14 DIAGNOSIS — Z720 Tobacco use: Secondary | ICD-10-CM | POA: Diagnosis not present

## 2014-10-14 LAB — CBC WITH DIFFERENTIAL/PLATELET
Basophils Absolute: 0 10*3/uL (ref 0.0–0.1)
Basophils Relative: 0 %
Eosinophils Absolute: 0.1 10*3/uL (ref 0.0–0.7)
Eosinophils Relative: 4 %
HCT: 39 % (ref 36.0–46.0)
Hemoglobin: 13.1 g/dL (ref 12.0–15.0)
Lymphocytes Relative: 40 %
Lymphs Abs: 1.1 10*3/uL (ref 0.7–4.0)
MCH: 32 pg (ref 26.0–34.0)
MCHC: 33.6 g/dL (ref 30.0–36.0)
MCV: 95.1 fL (ref 78.0–100.0)
Monocytes Absolute: 0.3 10*3/uL (ref 0.1–1.0)
Monocytes Relative: 10 %
Neutro Abs: 1.3 10*3/uL — ABNORMAL LOW (ref 1.7–7.7)
Neutrophils Relative %: 46 %
Platelets: 208 10*3/uL (ref 150–400)
RBC: 4.1 MIL/uL (ref 3.87–5.11)
RDW: 13.3 % (ref 11.5–15.5)
WBC: 2.9 10*3/uL — ABNORMAL LOW (ref 4.0–10.5)

## 2014-10-14 LAB — BASIC METABOLIC PANEL
Anion gap: 9 (ref 5–15)
BUN: 9 mg/dL (ref 6–20)
CO2: 26 mmol/L (ref 22–32)
Calcium: 9.1 mg/dL (ref 8.9–10.3)
Chloride: 101 mmol/L (ref 101–111)
Creatinine, Ser: 0.75 mg/dL (ref 0.44–1.00)
GFR calc Af Amer: >60 mL/min (ref >60)
GFR calc non Af Amer: >60 mL/min (ref >60)
Glucose, Bld: 93 mg/dL (ref 65–99)
Potassium: 4 mmol/L (ref 3.5–5.1)
Sodium: 136 mmol/L (ref 135–145)

## 2014-10-14 LAB — URINE MICROSCOPIC-ADD ON

## 2014-10-14 LAB — URINALYSIS, ROUTINE W REFLEX MICROSCOPIC
Bilirubin Urine: NEGATIVE
Glucose, UA: NEGATIVE mg/dL
Hgb urine dipstick: NEGATIVE
Ketones, ur: NEGATIVE mg/dL
Nitrite: POSITIVE — AB
Protein, ur: NEGATIVE mg/dL
Specific Gravity, Urine: 1.017 (ref 1.005–1.030)
Urobilinogen, UA: 1 mg/dL (ref 0.0–1.0)
pH: 5 (ref 5.0–8.0)

## 2014-10-14 MED ORDER — DEXTROSE 5 % IV SOLN
1.0000 g | Freq: Once | INTRAVENOUS | Status: AC
  Administered 2014-10-14: 1 g via INTRAVENOUS
  Filled 2014-10-14: qty 10

## 2014-10-14 MED ORDER — CEPHALEXIN 500 MG PO CAPS: 500.0000 mg | ORAL_CAPSULE | Freq: Two times a day (BID) | ORAL | Status: DC

## 2014-10-14 MED ORDER — SODIUM CHLORIDE 0.9 % IV BOLUS (SEPSIS)
500.0000 mL | Freq: Once | INTRAVENOUS | Status: AC
  Administered 2014-10-14: 500 mL via INTRAVENOUS

## 2014-10-14 NOTE — ED Notes (Signed)
Pt reports that she woke up around 0530 this morning to go the bathroom when she fell backwards onto bed. Pt denies head injury or passing out. Pt c/o dizziness.

## 2014-10-14 NOTE — ED Provider Notes (Addendum)
CSN: [id number]     Arrival date & time 10/14/14  0707 History   First MD Initiated Contact with Patient 10/14/14 0710     Chief Complaint  Patient presents with  . Weakness  . Fall     (Consider location/radiation/quality/duration/timing/severity/associated sxs/prior Treatment) HPI Comments: Patient presents with dizziness. She has a history of hyperlipidemia but is on no regular medications. She states that she woke up this morning about 5:30 and as she was getting out of bed she had an episode where she felt like the room was spinning. This lasted just a few seconds and she fell back onto the bed. She had no injuries from the fall. She did not hit her head. She currently denies any dizziness. She states she just had that one episode. She's been having these episodes for the last 4-5 months. It usually is in the morning when she is first getting out of bed and she doesn't have any further episodes throughout the day. She denies any numbness or weakness in her extremities. She denies any vision changes or speech deficits. She denies any leg swelling other than she has some intermittent swelling and pain to her left knee which is related to her arthritis.  Patient is a 81 y.o. female presenting with weakness and fall.  Weakness Pertinent negatives include no chest pain, no abdominal pain, no headaches and no shortness of breath.  Fall Pertinent negatives include no chest pain, no abdominal pain, no headaches and no shortness of breath.    Past Medical History  Diagnosis Date  . Hypercholesteremia   . Hip osteoarthritis 09/24/2010   Past Surgical History  Procedure Laterality Date  . Incision and drainage perirectal abscess  06/16/2011    Procedure: IRRIGATION AND DEBRIDEMENT PERIRECTAL ABSCESS;  Surgeon: Robyne Askew, MD;  Location: [facility] OR;  Service: General;  Laterality: N/A;   Family History  Problem Relation Age of Onset  . Kidney disease Mother   . Cancer Sister     colon  .  Cancer Brother     lung  . Cancer Sister     stomach   Social History  Substance Use Topics  . Smoking status: Current Every Day Smoker -- 0.50 packs/day    Types: Cigarettes    Last Attempt to Quit: 06/15/2011  . Smokeless tobacco: None  . Alcohol Use: No   OB History    No data available     Review of Systems  Constitutional: Negative for fever, chills, diaphoresis and fatigue.  HENT: Negative for congestion, rhinorrhea and sneezing.   Eyes: Negative.   Respiratory: Negative for cough, chest tightness and shortness of breath.   Cardiovascular: Negative for chest pain and leg swelling.  Gastrointestinal: Negative for nausea, vomiting, abdominal pain, diarrhea and blood in stool.  Genitourinary: Negative for frequency, hematuria, flank pain and difficulty urinating.  Musculoskeletal: Positive for arthralgias. Negative for back pain.  Skin: Negative for rash.  Neurological: Positive for dizziness. Negative for speech difficulty, weakness, numbness and headaches.      Allergies  Sulfonamide derivatives  Home Medications   Prior to Admission medications   Medication Sig Start Date End Date Taking? Authorizing Provider  acetaminophen (TYLENOL) 500 MG tablet Take 1,000 mg by mouth once a week. Thursday   Yes Historical Provider, MD  Cholecalciferol 50000 UNITS TABS Take 1 tablet by mouth once a week. 09/27/14  Yes Abram Sander, MD  cephALEXin (KEFLEX) 500 MG capsule Take 1 capsule (500 mg total) by  mouth 2 (two) times daily. 10/14/14   Rolan Bucco, MD  clobetasol cream (TEMOVATE) 0.05 % Apply 1 application topically 2 (two) times daily. Until itching improved, then taper to 3x a week. Patient not taking: Reported on 10/14/2014 06/22/14   Briscoe Deutscher, DO  clotrimazole (GYNE-LOTRIMIN 3) 2 % vaginal cream Place 1 Applicatorful vaginally at bedtime. Patient not taking: Reported on 10/14/2014 09/20/14   Latrelle Dodrill, MD  guaiFENesin 200 MG tablet Take 1 tablet (200 mg total)  by mouth every 4 (four) hours as needed for cough or to loosen phlegm. Patient not taking: Reported on 10/14/2014 08/17/13   Myra Rude, MD   BP 132/57 mmHg  Pulse 52  Resp 12  SpO2 98% Physical Exam  Constitutional: She is oriented to person, place, and time. She appears well-developed and well-nourished.  HENT:  Head: Normocephalic and atraumatic.  Eyes: Pupils are equal, round, and reactive to light.  No nystagmus  Neck: Normal range of motion. Neck supple.  Cardiovascular: Normal rate, regular rhythm and normal heart sounds.   Pulmonary/Chest: Effort normal and breath sounds normal. No respiratory distress. She has no wheezes. She has no rales. She exhibits no tenderness.  Abdominal: Soft. Bowel sounds are normal. There is no tenderness. There is no rebound and no guarding.  Musculoskeletal: Normal range of motion. She exhibits no edema.  Lymphadenopathy:    She has no cervical adenopathy.  Neurological: She is alert and oriented to person, place, and time. She has normal strength. No cranial nerve deficit or sensory deficit. GCS eye subscore is 4. GCS verbal subscore is 5. GCS motor subscore is 6.  FTN intact, no pronator drift  Skin: Skin is warm and dry. No rash noted.  Psychiatric: She has a normal mood and affect.    ED Course  Procedures (including critical care time) Labs Review Labs Reviewed  CBC WITH DIFFERENTIAL/PLATELET - Abnormal; Notable for the following:    WBC 2.9 (*)    Neutro Abs 1.3 (*)    All other components within normal limits  URINALYSIS, ROUTINE W REFLEX MICROSCOPIC (NOT AT [facility]) - Abnormal; Notable for the following:    APPearance CLOUDY (*)    Nitrite POSITIVE (*)    Leukocytes, UA TRACE (*)    All other components within normal limits  URINE MICROSCOPIC-ADD ON - Abnormal; Notable for the following:    Bacteria, UA MANY (*)    All other components within normal limits  URINE CULTURE  BASIC METABOLIC PANEL    Imaging Review Ct Head Wo  Contrast  10/14/2014   CLINICAL DATA:  Fall backwards, vertigo, dizziness, possible head injury  EXAM: CT HEAD WITHOUT CONTRAST  TECHNIQUE: Contiguous axial images were obtained from the base of the skull through the vertex without contrast.  COMPARISON:  08/12/2011  FINDINGS: Stable brain atrophy pattern and mild chronic white matter microvascular ischemic change throughout the cerebral hemispheres. Remote right basal ganglia lacunar-type infarct. No acute intracranial hemorrhage, mass lesion, definite infarction, midline shift, herniation, hydrocephalus, or extra-axial fluid collection. Gray-white matter differentiation maintained. Cisterns are patent. No cerebellar abnormality. Atherosclerosis of the intracranial vessels of the skullbase. Symmetric orbits. Mastoids and sinuses remain clear.  IMPRESSION: Stable atrophy and mild chronic white matter ischemic changes.  No interval change or acute process by noncontrast CT.   Electronically Signed   By: Judie Petit.  Shick M.D.   On: 10/14/2014 08:24   I have personally reviewed and evaluated these images and lab results as part of  my medical decision-making.   EKG Interpretation   Date/Time:  Friday October 14 2014 07:35:04 EDT Ventricular Rate:  50 PR Interval:  186 QRS Duration: 85 QT Interval:  451 QTC Calculation: 411 R Axis:   76 Text Interpretation:  Sinus rhythm Atrial premature complex Anteroseptal  infarct, age indeterminate since last tracing no significant change  Confirmed by BELFI  MD, MELANIE ([id number]) on 10/14/2014 8:27:38 AM      MDM   Final diagnoses:  UTI (lower urinary tract infection)  Leukopenia   Patient presents with an episode of vertigo-like dizziness. This was brief and she had no further episodes. She has no current ataxia. His had these episodes for the last few months. She always only has one episode in the morning when she ambulates. I don't feel that the symptoms would be more consistent with a posterior stroke. She has  no fevers or other signs of illness. She has nothing that sounds cardiac in nature. She does have evidence of UTI. She also appeared to be old dehydrated and was given an IV fluid bolus. She was started on Keflex for her UTI. She was given dose Rocephin in the ED and a urine culture was sent. She also had leukopenia which appeared new as compared to her last values. I did discuss with the patient and her daughter that she needs to follow-up with her primary care physician who is the family [facility] for follow-up of this.     Rolan Bucco, MD 10/14/14 1459  Rolan Bucco, MD 10/14/14 1500

## 2014-10-14 NOTE — ED Notes (Signed)
Pt ambulatory to restroom down the hall from patient room. Gait steady and well balanced, denies dizziness. Unable to provide urine specimen at current time.

## 2014-10-14 NOTE — ED Notes (Signed)
Bladder scanned pt and bladder shows <30 cc urine. Will notify MD and see if recommends I/O cath.

## 2014-10-14 NOTE — Discharge Instructions (Signed)
Leukopenia Leukopenia is a condition in which you have a low number of white blood cells. White blood cells help your body fight infections. The number of white blood cells in the body varies from person to person. Leukopenia is usually defined as having fewer than 4,000 white blood cells in 1 microliter of blood. There are five types of white blood cells. Two types make up most of your white blood cell count. These are neutrophils and lymphocytes. When your level of neutrophils is low, it is called neutropenia. When your lymphocytes are low, it is called lymphocytopenia. Neutropenia is the most dangerous type of leukopenia because it can lead to dangerous infections. CAUSES  Most white blood cells are made in the soft tissue inside your bones (bone marrow). Conditions that damage or suppress bone marrow are the most common causes of leukopenia. These include:  Medicine or X-ray treatments for cancer.  Serious infections.  Cancer of the white blood cells (leukemia or myeloma).  Medicines, including antibiotics, cardiac drugs, steroids, and those used to treat rheumatoid arthritis. Leukopenia also happens when white blood cells are destroyed after leaving your bone marrow. Causes may include:  Liver disease.  Diseases of the immune system (autoimmune disease).  Vitamin B deficiencies. SIGNS AND SYMPTOMS One of the most common signs of leukopenia, especially severe neutropenia, is having a lot of bacterial infections. Different infections have different symptoms. An infection in your lungs may cause coughing. A urinary tract infection may cause frequent urination and a burning sensation. You may also get infections of the blood, skin, rectum, throat, sinus, or ear. General signs and symptoms of leukopenia include:  Fever.  Fatigue.  Swollen glands (lymph nodes).  Painful mouth ulcers.  Gum disease. DIAGNOSIS  Your health care provider can diagnose leukopenia based on a physical exam  and the results of lab tests. During a physical exam, your health care provider will feel for swollen lymph nodes and check whether your spleen is enlarged. Your spleen is an organ on the left side of your body that stores white blood cells. Tests that may be done include:  A complete blood count. This blood test counts each type of white cell.  Bone marrow aspiration. Some bone marrow is removed to be checked under a microscope.  Lymph node biopsy. Some lymph node tissue is removed to be checked under a microscope.  Other types of blood tests or imaging tests. TREATMENT  Treatment of leukopenia depends on the cause. Some common treatments include:  Antibiotics for bacterial infections.  No longer taking medicines that may cause leukopenia.  Vitamin B supplements.  Medicines to stimulate neutrophil production (hematopoietic growth factors) for neutropenia. HOME CARE INSTRUCTIONS  Preventing infection is important if you have leukopenia.  Avoid sick friends and family members.  Wash your hands often.  Do noteat uncooked or undercooked meats.  Wash fruits and vegetables.  Do not eat or drink unpasteurized dairy products.  Get regular dental care, and maintain good dental hygiene.  Keep all follow-up appointments. SEEK MEDICAL CARE IF:  You have chills or a fever.  You have signs or symptoms of infection. SEEK IMMEDIATE MEDICAL CARE IF:  You have a fever or persistent symptoms for more than 2-3 days.  You have trouble breathing.  You have chest pain. MAKE SURE YOU:  Understand these instructions.  Will watch your condition.  Will get help right away if you are not doing well or get worse. Document Released: 01/19/2013 Document Reviewed: 01/19/2013 [employer] Patient Information  2015 [employer]. This information is not intended to replace advice given to you by your health care provider. Make sure you discuss any questions you have with your health care  provider.  Urinary Tract Infection Urinary tract infections (UTIs) can develop anywhere along your urinary tract. Your urinary tract is your body's drainage system for removing wastes and extra water. Your urinary tract includes two kidneys, two ureters, a bladder, and a urethra. Your kidneys are a pair of bean-shaped organs. Each kidney is about the size of your fist. They are located below your ribs, one on each side of your spine. CAUSES Infections are caused by microbes, which are microscopic organisms, including fungi, viruses, and bacteria. These organisms are so small that they can only be seen through a microscope. Bacteria are the microbes that most commonly cause UTIs. SYMPTOMS  Symptoms of UTIs may vary by age and gender of the patient and by the location of the infection. Symptoms in young women typically include a frequent and intense urge to urinate and a painful, burning feeling in the bladder or urethra during urination. Older women and men are more likely to be tired, shaky, and weak and have muscle aches and abdominal pain. A fever may mean the infection is in your kidneys. Other symptoms of a kidney infection include pain in your back or sides below the ribs, nausea, and vomiting. DIAGNOSIS To diagnose a UTI, your caregiver will ask you about your symptoms. Your caregiver also will ask to provide a urine sample. The urine sample will be tested for bacteria and white blood cells. White blood cells are made by your body to help fight infection. TREATMENT  Typically, UTIs can be treated with medication. Because most UTIs are caused by a bacterial infection, they usually can be treated with the use of antibiotics. The choice of antibiotic and length of treatment depend on your symptoms and the type of bacteria causing your infection. HOME CARE INSTRUCTIONS  If you were prescribed antibiotics, take them exactly as your caregiver instructs you. Finish the medication even if you feel better  after you have only taken some of the medication.  Drink enough water and fluids to keep your urine clear or pale yellow.  Avoid caffeine, tea, and carbonated beverages. They tend to irritate your bladder.  Empty your bladder often. Avoid holding urine for long periods of time.  Empty your bladder before and after sexual intercourse.  After a bowel movement, women should cleanse from front to back. Use each tissue only once. SEEK MEDICAL CARE IF:   You have back pain.  You develop a fever.  Your symptoms do not begin to resolve within 3 days. SEEK IMMEDIATE MEDICAL CARE IF:   You have severe back pain or lower abdominal pain.  You develop chills.  You have nausea or vomiting.  You have continued burning or discomfort with urination. MAKE SURE YOU:   Understand these instructions.  Will watch your condition.  Will get help right away if you are not doing well or get worse. Document Released: 10/24/2004 Document Revised: 07/16/2011 Document Reviewed: 02/22/2011 [facility] Patient Information 2015 [place], [place]. This information is not intended to replace advice given to you by your health care provider. Make sure you discuss any questions you have with your health care provider.

## 2014-10-16 LAB — URINE CULTURE: Culture: >=100000

## 2014-10-18 ENCOUNTER — Telehealth (HOSPITAL_COMMUNITY): Payer: Self-pay

## 2014-10-18 NOTE — Telephone Encounter (Signed)
Post ED Visit - Positive Culture Follow-up  Culture report reviewed by antimicrobial stewardship pharmacist:   Celedonio Miyamoto, Pharm.D., BCPS  Georgina Pillion, Pharm.D., BCPS  [place], [address].D., BCPS, AAHIVP  Estella Husk, Pharm.D., BCPS, AAHIVP  Cristy Reyes, [address].D.  Cassie Roseanne Reno, [address].D.  Positive Urine culture, 100,000 colonies -> Klebsiella Pneumoniae Treated with Cephalexin, organism sensitive to the same and no further patient follow-up is required at this time.  Arvid Right 10/18/2014, 3:45 AM

## 2014-11-17 ENCOUNTER — Ambulatory Visit: Payer: Medicare Other

## 2015-03-11 ENCOUNTER — Encounter (HOSPITAL_COMMUNITY): Payer: Self-pay | Admitting: Emergency Medicine

## 2015-03-11 ENCOUNTER — Emergency Department (HOSPITAL_COMMUNITY)
Admission: EM | Admit: 2015-03-11 | Discharge: 2015-03-11 | Disposition: A | Discharge status: Home / Self Care | Payer: Medicare Other | Attending: Emergency Medicine | Admitting: Emergency Medicine

## 2015-03-11 ENCOUNTER — Emergency Department (HOSPITAL_COMMUNITY): Payer: Medicare Other

## 2015-03-11 DIAGNOSIS — Z8639 Personal history of other endocrine, nutritional and metabolic disease: Secondary | ICD-10-CM | POA: Insufficient documentation

## 2015-03-11 DIAGNOSIS — F17210 Nicotine dependence, cigarettes, uncomplicated: Secondary | ICD-10-CM | POA: Diagnosis not present

## 2015-03-11 DIAGNOSIS — N390 Urinary tract infection, site not specified: Secondary | ICD-10-CM | POA: Diagnosis not present

## 2015-03-11 DIAGNOSIS — D72819 Decreased white blood cell count, unspecified: Secondary | ICD-10-CM | POA: Insufficient documentation

## 2015-03-11 DIAGNOSIS — R1030 Lower abdominal pain, unspecified: Secondary | ICD-10-CM | POA: Diagnosis present

## 2015-03-11 LAB — BASIC METABOLIC PANEL
Anion gap: 11 (ref 5–15)
BUN: 10 mg/dL (ref 6–20)
CO2: 23 mmol/L (ref 22–32)
Calcium: 9.4 mg/dL (ref 8.9–10.3)
Chloride: 104 mmol/L (ref 101–111)
Creatinine, Ser: 0.85 mg/dL (ref 0.44–1.00)
GFR calc Af Amer: >60 mL/min (ref >60)
GFR calc non Af Amer: >60 mL/min (ref >60)
Glucose, Bld: 139 mg/dL — ABNORMAL HIGH (ref 65–99)
Potassium: 4.7 mmol/L (ref 3.5–5.1)
Sodium: 138 mmol/L (ref 135–145)

## 2015-03-11 LAB — URINALYSIS, ROUTINE W REFLEX MICROSCOPIC
Glucose, UA: NEGATIVE mg/dL
Ketones, ur: NEGATIVE mg/dL
Nitrite: NEGATIVE
Protein, ur: 100 mg/dL — AB
Specific Gravity, Urine: 1.023 (ref 1.005–1.030)
pH: 7 (ref 5.0–8.0)

## 2015-03-11 LAB — CBC WITH DIFFERENTIAL/PLATELET
Basophils Absolute: 0 10*3/uL (ref 0.0–0.1)
Basophils Relative: 0 %
Eosinophils Absolute: 0.1 10*3/uL (ref 0.0–0.7)
Eosinophils Relative: 2 %
HCT: 37.5 % (ref 36.0–46.0)
Hemoglobin: 12.7 g/dL (ref 12.0–15.0)
Lymphocytes Relative: 28 %
Lymphs Abs: 0.7 10*3/uL (ref 0.7–4.0)
MCH: 34.8 pg — ABNORMAL HIGH (ref 26.0–34.0)
MCHC: 33.9 g/dL (ref 30.0–36.0)
MCV: 102.7 fL — ABNORMAL HIGH (ref 78.0–100.0)
Monocytes Absolute: 0.1 10*3/uL (ref 0.1–1.0)
Monocytes Relative: 5 %
Neutro Abs: 1.6 10*3/uL — ABNORMAL LOW (ref 1.7–7.7)
Neutrophils Relative %: 65 %
Platelets: 174 10*3/uL (ref 150–400)
RBC: 3.65 MIL/uL — ABNORMAL LOW (ref 3.87–5.11)
RDW: 13.6 % (ref 11.5–15.5)
WBC: 2.5 10*3/uL — ABNORMAL LOW (ref 4.0–10.5)

## 2015-03-11 LAB — URINE MICROSCOPIC-ADD ON

## 2015-03-11 MED ORDER — CEPHALEXIN 250 MG PO CAPS: 250.0000 mg | ORAL_CAPSULE | Freq: Four times a day (QID) | ORAL | Status: DC

## 2015-03-11 MED ORDER — SODIUM CHLORIDE 0.9 % IV BOLUS (SEPSIS)
500.0000 mL | Freq: Once | INTRAVENOUS | Status: AC
  Administered 2015-03-11: 500 mL via INTRAVENOUS

## 2015-03-11 MED ORDER — DEXTROSE 5 % IV SOLN
1.0000 g | Freq: Once | INTRAVENOUS | Status: AC
  Administered 2015-03-11: 1 g via INTRAVENOUS
  Filled 2015-03-11: qty 10

## 2015-03-11 NOTE — ED Notes (Signed)
Pt arrives from home reporting lower abdominal pain worse with urination, increased urinary frequency.  Pt reports pain worse in L side.  Pt denies N/V/D. NAD noted at this time.

## 2015-03-11 NOTE — Discharge Instructions (Signed)
Please take all antibiotics as ordered. You should be rechecked by her doctor early next week. Your white blood cell count is low. This can occur from infection and you appear to have not active white blood cells too fight the infection. Return here if you are worse at any time including unable to tolerate her medications, worsen pain, or high fever.   Urinary Tract Infection A urinary tract infection (UTI) can occur any place along the urinary tract. The tract includes the kidneys, ureters, bladder, and urethra. A type of germ called bacteria often causes a UTI. UTIs are often helped with antibiotic medicine.  HOME CARE   If given, take antibiotics as told by your doctor. Finish them even if you start to feel better.  Drink enough fluids to keep your pee (urine) clear or pale yellow.  Avoid tea, drinks with caffeine, and bubbly (carbonated) drinks.  Pee often. Avoid holding your pee in for a long time.  Pee before and after having sex (intercourse).  Wipe from front to back after you poop (bowel movement) if you are a woman. Use each tissue only once. GET HELP RIGHT AWAY IF:   You have back pain.  You have lower belly (abdominal) pain.  You have chills.  You feel sick to your stomach (nauseous).  You throw up (vomit).  Your burning or discomfort with peeing does not go away.  You have a fever.  Your symptoms are not better in 3 days. MAKE SURE YOU:   Understand these instructions.  Will watch your condition.  Will get help right away if you are not doing well or get worse.   This information is not intended to replace advice given to you by your health care provider. Make sure you discuss any questions you have with your health care provider.   Document Released: 07/03/2007 Document Revised: 02/04/2014 Document Reviewed: 08/15/2011 Elsevier Interactive Patient Education [employer]

## 2015-03-11 NOTE — ED Notes (Signed)
Dr Ray at bedside. 

## 2015-03-11 NOTE — ED Notes (Signed)
Dr Ray MD at bedside. 

## 2015-03-11 NOTE — ED Provider Notes (Signed)
CSN: [id number]     Arrival date & time 03/11/15  5621 History   First MD Initiated Contact with Patient 03/11/15 [phone]     Chief Complaint  Patient presents with  . Abdominal Pain     (Consider location/radiation/quality/duration/timing/severity/associated sxs/prior Treatment) HPI This is an 82 year old female who comes in today complaining of crampy lower abdominal pain for several days. She states that it occurs when she urinates. She has had increased frequency of urination. She has had urinary tract infections in the past. She denies fever, nausea, vomiting, diarrhea. States the pain is somewhat worse on the left side than the right side. Pain resolves in between episodes of urinating. Past Medical History  Diagnosis Date  . Hypercholesteremia   . Hip osteoarthritis 09/24/2010   Past Surgical History  Procedure Laterality Date  . Incision and drainage perirectal abscess  06/16/2011    Procedure: IRRIGATION AND DEBRIDEMENT PERIRECTAL ABSCESS;  Surgeon: Robyne Askew, MD;  Location: [facility] OR;  Service: General;  Laterality: N/A;  . Abdominal hysterectomy     Family History  Problem Relation Age of Onset  . Kidney disease Mother   . Cancer Sister     colon  . Cancer Brother     lung  . Cancer Sister     stomach   Social History  Substance Use Topics  . Smoking status: Current Every Day Smoker -- 0.50 packs/day    Types: Cigarettes    Last Attempt to Quit: 06/15/2011  . Smokeless tobacco: None  . Alcohol Use: No   OB History    No data available     Review of Systems  All other systems reviewed and are negative.     Allergies  Sulfonamide derivatives  Home Medications   Prior to Admission medications   Medication Sig Start Date End Date Taking? Authorizing Provider  acetaminophen (TYLENOL) 500 MG tablet Take 1,000 mg by mouth 2 (two) times daily as needed for mild pain. Thursday   Yes Historical Provider, MD   BP 112/70 mmHg  Pulse 67  Temp(Src) 97.5 F  (36.4 C) (Oral)  Resp 14  Ht  (1.702 m)  Wt 61.236 kg  BMI 21.14 kg/m2  SpO2 100% Physical Exam  Constitutional: She is oriented to person, place, and time. She appears well-developed and well-nourished.  HENT:  Head: Normocephalic and atraumatic.  Right Ear: External ear normal.  Left Ear: External ear normal.  Nose: Nose normal.  Mouth/Throat: Oropharynx is clear and moist.  Eyes: Conjunctivae and EOM are normal. Pupils are equal, round, and reactive to light.  Neck: Normal range of motion. Neck supple.  Cardiovascular: Normal rate, regular rhythm, normal heart sounds and intact distal pulses.   Pulmonary/Chest: Effort normal and breath sounds normal.  Abdominal: Soft. Bowel sounds are normal. She exhibits no distension and no mass. There is no tenderness. There is no rebound and no guarding.  Musculoskeletal: Normal range of motion.  Neurological: She is alert and oriented to person, place, and time. She has normal reflexes.  Skin: Skin is warm and dry.  Psychiatric: She has a normal mood and affect. Her behavior is normal. Judgment and thought content normal.  Nursing note and vitals reviewed.   ED Course  Procedures (including critical care time) Labs Review Labs Reviewed  URINALYSIS, ROUTINE W REFLEX MICROSCOPIC (NOT AT [facility]) - Abnormal; Notable for the following:    Color, Urine AMBER (*)    APPearance TURBID (*)    Hgb urine dipstick  LARGE (*)    Bilirubin Urine SMALL (*)    Protein, ur 100 (*)    Leukocytes, UA LARGE (*)    All other components within normal limits  CBC WITH DIFFERENTIAL/PLATELET - Abnormal; Notable for the following:    WBC 2.5 (*)    RBC 3.65 (*)    MCV 102.7 (*)    MCH 34.8 (*)    Neutro Abs 1.6 (*)    All other components within normal limits  BASIC METABOLIC PANEL - Abnormal; Notable for the following:    Glucose, Bld 139 (*)    All other components within normal limits  URINE MICROSCOPIC-ADD ON - Abnormal; Notable for the  following:    Squamous Epithelial / LPF 0-5 (*)    Bacteria, UA MANY (*)    All other components within normal limits    Imaging Review Ct Renal Stone Study  03/11/2015  CLINICAL DATA:  82 year old female with left-sided flank and abdominal pain for 2 weeks with hematuria. EXAM: CT ABDOMEN AND PELVIS WITHOUT CONTRAST TECHNIQUE: Multidetector CT imaging of the abdomen and pelvis was performed following the standard protocol without IV contrast. COMPARISON:  06/15/2011 and prior CTs FINDINGS: Please note that parenchymal abnormalities may be missed without intravenous contrast. Lower chest: No acute abnormalities. Cardiomegaly and coronary artery calcifications again noted. Hepatobiliary: The liver is unremarkable. Cholelithiasis identified without CT evidence of acute cholecystitis. There is no evidence of biliary dilatation. Pancreas: Unremarkable Spleen: Unremarkable Adrenals/Urinary Tract: No significant renal, adrenal or bladder abnormalities noted. There is no evidence of hydronephrosis or urinary calculi. Stomach/Bowel: Colonic diverticulosis noted without evidence of diverticulitis. There is no evidence of bowel obstruction or definite bowel wall thickening. Vascular/Lymphatic: Aortic calcifications noted without aneurysm. No enlarged lymph nodes identified. Reproductive: Patient is status post hysterectomy. Laxity of the pelvic floor is noted. Other: No free fluid, focal collection or pneumoperitoneum. A stable small supraumbilical midline ventral hernia containing fat noted. Musculoskeletal: No acute or suspicious abnormalities identified. A superior endplate compression fracture of L4 is unchanged. IMPRESSION: No evidence of acute abnormality. Cardiomegaly, coronary disease and aortic atherosclerosis. Cholelithiasis without CT evidence of acute cholecystitis. Colonic diverticulosis. Electronically Signed   By: Harmon Pier M.D.   On: 03/11/2015 11:05   I have personally reviewed and evaluated these  images and lab results as part of my medical decision-making.   MDM   Final diagnoses:  UTI (lower urinary tract infection)  Leukopenia    Patient with evaluation here significant for catheterized urine with many bacteria, with too numerous to count red blood cells and too numerous to count white blood cells. Given patient's age will assess with CT without contrast CT reveals no evidence of acute upper mouth. A materia may reflect infection. Plan home Keflex and follow-up with primary care to assure clearing of hematuria. Patient and daughter informed of return precautions and need for close follow-up and is understanding.  Margarita Grizzle, MD 03/12/15 [phone]

## 2015-03-21 ENCOUNTER — Telehealth: Payer: Self-pay | Admitting: *Deleted

## 2015-03-21 NOTE — Telephone Encounter (Signed)
Called patient to offer flu vaccine.  Patient declined.  DUCATTE, LAURENZE L, RN     

## 2015-03-23 ENCOUNTER — Encounter: Payer: Self-pay | Admitting: Family Medicine

## 2015-03-23 ENCOUNTER — Ambulatory Visit (INDEPENDENT_AMBULATORY_CARE_PROVIDER_SITE_OTHER): Payer: Medicare Other | Admitting: Family Medicine

## 2015-03-23 VITALS — BP 113/90 | HR 81 | Temp 98.0°F | Ht 67.0 in | Wt 127.2 lb

## 2015-03-23 DIAGNOSIS — N390 Urinary tract infection, site not specified: Secondary | ICD-10-CM

## 2015-03-23 DIAGNOSIS — F0150 Vascular dementia without behavioral disturbance: Secondary | ICD-10-CM | POA: Insufficient documentation

## 2015-03-23 DIAGNOSIS — F0280 Dementia in other diseases classified elsewhere without behavioral disturbance: Secondary | ICD-10-CM

## 2015-03-23 DIAGNOSIS — R413 Other amnesia: Secondary | ICD-10-CM

## 2015-03-23 DIAGNOSIS — D72819 Decreased white blood cell count, unspecified: Secondary | ICD-10-CM | POA: Diagnosis not present

## 2015-03-23 DIAGNOSIS — G309 Alzheimer's disease, unspecified: Secondary | ICD-10-CM

## 2015-03-23 HISTORY — DX: Decreased white blood cell count, unspecified: D72.819

## 2015-03-23 HISTORY — DX: Vascular dementia, unspecified severity, without behavioral disturbance, psychotic disturbance, mood disturbance, and anxiety: F01.50

## 2015-03-23 HISTORY — DX: Vascular dementia, unspecified severity, without behavioral disturbance, psychotic disturbance, mood disturbance, and anxiety: F02.80

## 2015-03-23 HISTORY — DX: Urinary tract infection, site not specified: N39.0

## 2015-03-23 LAB — CBC WITH DIFFERENTIAL/PLATELET
Basophils Absolute: 0 10*3/uL (ref 0.0–0.1)
Basophils Relative: 0 % (ref 0–1)
Eosinophils Absolute: 0.1 10*3/uL (ref 0.0–0.7)
Eosinophils Relative: 3 % (ref 0–5)
HCT: 33.9 % — ABNORMAL LOW (ref 36.0–46.0)
Hemoglobin: 11.8 g/dL — ABNORMAL LOW (ref 12.0–15.0)
Lymphocytes Relative: 38 % (ref 12–46)
Lymphs Abs: 1.2 10*3/uL (ref 0.7–4.0)
MCH: 34.5 pg — ABNORMAL HIGH (ref 26.0–34.0)
MCHC: 34.8 g/dL (ref 30.0–36.0)
MCV: 99.1 fL (ref 78.0–100.0)
MPV: 9 fL (ref 8.6–12.4)
Monocytes Absolute: 0.2 10*3/uL (ref 0.1–1.0)
Monocytes Relative: 5 % (ref 3–12)
Neutro Abs: 1.7 10*3/uL (ref 1.7–7.7)
Neutrophils Relative %: 54 % (ref 43–77)
Platelets: 193 10*3/uL (ref 150–400)
RBC: 3.42 MIL/uL — ABNORMAL LOW (ref 3.87–5.11)
RDW: 14.6 % (ref 11.5–15.5)
WBC: 3.1 10*3/uL — ABNORMAL LOW (ref 4.0–10.5)

## 2015-03-23 MED ORDER — CEPHALEXIN 250 MG PO CAPS: 500.0000 mg | ORAL_CAPSULE | Freq: Two times a day (BID) | ORAL | Status: DC

## 2015-03-27 ENCOUNTER — Telehealth: Payer: Self-pay | Admitting: Family Medicine

## 2015-03-27 NOTE — Assessment & Plan Note (Signed)
In setting of UTI, told to recheck for resolution, explained that it may take slightly longer but will recheck today - cbc - if not resolved will recheck next month, call daughter with results

## 2015-03-27 NOTE — Assessment & Plan Note (Signed)
Symptoms resolved, not taking keflex as prescribed by EDP because hard to remember - continue keflex, ok to take 500bid rather than 250qid, finish course - f/u if symptoms return

## 2015-03-27 NOTE — Assessment & Plan Note (Signed)
Seems to have significant dementia, no formal eval or diagnosis so far, referred to geri clinic - daughter worried about safety, is hording and creating fall risks, also may be kicked out of appartment because of hygeine issues, declines assistance - pt refuses my offer to have social worker come out, she expresses some paranoia about people stealing from her - will talk with [facility] SW and see if they can contact her and get permission to come by - could also consider adult protective services for self neglect but do not feel the situation warrants this yet, discussed with daughter and will try to get patient buy-in first

## 2015-03-27 NOTE — Telephone Encounter (Signed)
Daughter would like to know if dr has been able to contact a Child psychotherapist.

## 2015-03-27 NOTE — Progress Notes (Signed)
Subjective:   Joann [name] is a 82 y.o. female with a history of recent ED visit for UTI here for UTI f/u and memory concerns  Pt reports no more dysuria or abdominal pain since starting keflex, has been taking 1-2x/day because she has trouble remembering. No fevers. Feeling well.  Daughter again expresses significant concern about mothers ability to care for herself. She lives alone and has been threatened with foreclosure because she will not allow anyone in to clean the house. Daughter states all rooms and calls are stacked with boxes leaving very narrow walkways that pose a fall risk. She sponges bathes herself because of fear of falling and refuses daughters help with bathing, this is a big change because she was previously extremely concerned with cleanliness. Daughter does not think she has any trouble with wandering or leaving on burners or other acute dangers, she states she does very little and just sits in the house all day. She eats a small breakfast but little else and is losing weight.   Review of Systems:  Per HPI. All other systems reviewed and are negative.   PMH, PSH, Medications, Allergies, and FmHx reviewed and updated in EMR.  Social History: current smoker  Objective:  BP 113/90 mmHg  Pulse 81  Temp(Src) 98 F (36.7 C) (Oral)  Ht  (1.702 m)  Wt 127 lb 3.2 oz (57.698 kg)  BMI 19.92 kg/m2  Gen: thin 82 y.o. female in NAD HEENT: NCAT, MMM, EOMI, PERRL, anicteric sclerae CV: RRR, no MRG, no JVD Resp: Non-labored, CTAB, no wheezes noted Abd: Soft, NTND, BS present, no guarding or organomegaly Ext: WWP, no edema MSK: Full ROM, strength intact Neuro: Alert and oriented, speech normal      Chemistry      Component Value Date/Time   NA 138 03/11/2015 0732   K 4.7 03/11/2015 0732   CL 104 03/11/2015 0732   CO2 23 03/11/2015 0732   BUN 10 03/11/2015 0732   CREATININE 0.85 03/11/2015 0732   CREATININE 0.78 06/22/2014 1515      Component Value Date/Time   CALCIUM 9.4 03/11/2015 0732   ALKPHOS 49 06/21/2011 0545   AST 23 06/21/2011 0545   ALT 23 06/21/2011 0545   BILITOT 0.3 06/21/2011 0545      Lab Results  Component Value Date   WBC 3.1* 03/23/2015   HGB 11.8* 03/23/2015   HCT 33.9* 03/23/2015   MCV 99.1 03/23/2015   PLT 193 03/23/2015   Lab Results  Component Value Date   TSH 1.437 09/17/2007   No results found for: HGBA1C Assessment & Plan:     Joann [name] is a 82 y.o. female here for UTI and possible dementia  UTI (urinary tract infection) Symptoms resolved, not taking keflex as prescribed by EDP because hard to remember - continue keflex, ok to take 500bid rather than 250qid, finish course - f/u if symptoms return  Leukopenia In setting of UTI, told to recheck for resolution, explained that it may take slightly longer but will recheck today - cbc - if not resolved will recheck next month, call daughter with results  Memory deficit Seems to have significant dementia, no formal eval or diagnosis so far, referred to geri clinic - daughter worried about safety, is hording and creating fall risks, also may be kicked out of appartment because of hygeine issues, declines assistance - pt refuses my offer to have social worker come out, she expresses some paranoia about people stealing from her - will  talk with [facility] SW and see if they can contact her and get permission to come by - could also consider adult protective services for self neglect but do not feel the situation warrants this yet, discussed with daughter and will try to get patient buy-in first    Beverely Low, MD, MPH [facility] PGY-3 03/27/2015 10:22 AM

## 2015-03-28 NOTE — Telephone Encounter (Signed)
Returned call and advised her to contact adult protective services about her mother's self-neglect and the possible need to obtain guardianship over her in the near future. Daughter agrees and will call them.

## 2015-03-28 NOTE — Progress Notes (Signed)
Patient ID: Joann [name], female   DOB: September 03, 1927, 82 y.o.   MRN: [id number] I was available as preceptor to resident for this patient's office visit.

## 2015-04-27 ENCOUNTER — Encounter: Payer: Self-pay | Admitting: Family Medicine

## 2015-04-27 ENCOUNTER — Ambulatory Visit (INDEPENDENT_AMBULATORY_CARE_PROVIDER_SITE_OTHER): Payer: Medicare Other | Admitting: Family Medicine

## 2015-04-27 ENCOUNTER — Encounter: Payer: Self-pay | Admitting: Licensed Clinical Social Worker

## 2015-04-27 VITALS — BP 126/60 | HR 67 | Temp 97.5°F | Ht 67.0 in | Wt 122.0 lb

## 2015-04-27 DIAGNOSIS — D72819 Decreased white blood cell count, unspecified: Secondary | ICD-10-CM

## 2015-04-27 DIAGNOSIS — F0150 Vascular dementia without behavioral disturbance: Secondary | ICD-10-CM

## 2015-04-27 DIAGNOSIS — G309 Alzheimer's disease, unspecified: Secondary | ICD-10-CM

## 2015-04-27 DIAGNOSIS — E559 Vitamin D deficiency, unspecified: Secondary | ICD-10-CM | POA: Diagnosis not present

## 2015-04-27 DIAGNOSIS — I639 Cerebral infarction, unspecified: Secondary | ICD-10-CM

## 2015-04-27 DIAGNOSIS — I6381 Other cerebral infarction due to occlusion or stenosis of small artery: Secondary | ICD-10-CM

## 2015-04-27 DIAGNOSIS — D510 Vitamin B12 deficiency anemia due to intrinsic factor deficiency: Secondary | ICD-10-CM

## 2015-04-27 DIAGNOSIS — F0390 Unspecified dementia without behavioral disturbance: Secondary | ICD-10-CM

## 2015-04-27 DIAGNOSIS — F0280 Dementia in other diseases classified elsewhere without behavioral disturbance: Secondary | ICD-10-CM

## 2015-04-27 DIAGNOSIS — E538 Deficiency of other specified B group vitamins: Secondary | ICD-10-CM

## 2015-04-27 LAB — CBC
HCT: 35.7 % — ABNORMAL LOW (ref 36.0–46.0)
Hemoglobin: 12.4 g/dL (ref 12.0–15.0)
MCH: 35.2 pg — ABNORMAL HIGH (ref 26.0–34.0)
MCHC: 34.7 g/dL (ref 30.0–36.0)
MCV: 101.4 fL — ABNORMAL HIGH (ref 78.0–100.0)
MPV: 8.9 fL (ref 8.6–12.4)
Platelets: 193 10*3/uL (ref 150–400)
RBC: 3.52 MIL/uL — ABNORMAL LOW (ref 3.87–5.11)
RDW: 14.8 % (ref 11.5–15.5)
WBC: 3.7 10*3/uL — ABNORMAL LOW (ref 4.0–10.5)

## 2015-04-27 NOTE — Progress Notes (Addendum)
Patient ID: Joann [name], female   DOB: [birth year], 82 y.o.   MRN: [id number]   Patient in today for office visit with Dr. McDiarmid.  CSW consult requested to provide patient's daughter with information on obtaining guardianship due to Alzheimer's diagnosis.   CSW met with patient's daughter Joann [name] [phone]) to provide emotional support as patient's primary support person and resources.  Per daughter she has assisted with transporting, shopping and paying patient's bills for the past year.  States patient is alert to self and place, however often forgets and requires redirecting.  Patient has lived alone in [place] for over 10 years.  Per daughter patient does not utilize any assistance devices when she ambulates, however states she needs to.  Daughter states patient has not had any falls in the past year, however patient reported a recent fall.  Daughter has spoken to MD today and will contact APS to share concerns of patient's hording.  Also to discuss obtaining guardianship.  Per daughter, patient is independent of most ADL's.  Does limited cooking, washed dishes.   When asked if she thought patient was in imminent danger, daughter stated no.   Also states no current or previous involvement with APS.   CSW discussed community resources with daughter that would provide an opportunity for patient to go out daily or have in-home supportive services. Per daughter patient is closed to both options.  CSW provided daughter with additional information and resources for Assistant Living Facilities, as daughter is considering possible placement once she receives guardianship.    Daughter was appreciative of information provided by CSW.  She will follow up with APS for assistance.  Daughter will also follow up with CSW if additional resources are needed.   CSW informed by MD after visit that patient states she has a loaded gun in her nightstand however informed him not to tell her daughter.   CSW concerned, due to patient's Alzheimer's diagnosis.  Call made to Adult Protective Services [phone]. Left message.     Casimer Lanius. Kimble Work,  [phone] 4:29 PM
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
## 2015-04-28 ENCOUNTER — Encounter: Payer: Self-pay | Admitting: Family Medicine

## 2015-04-28 ENCOUNTER — Telehealth: Payer: Self-pay | Admitting: Licensed Clinical Social Worker

## 2015-04-28 DIAGNOSIS — D72819 Decreased white blood cell count, unspecified: Secondary | ICD-10-CM | POA: Insufficient documentation

## 2015-04-28 DIAGNOSIS — I6381 Other cerebral infarction due to occlusion or stenosis of small artery: Secondary | ICD-10-CM

## 2015-04-28 DIAGNOSIS — E538 Deficiency of other specified B group vitamins: Secondary | ICD-10-CM

## 2015-04-28 HISTORY — DX: Deficiency of other specified B group vitamins: E53.8

## 2015-04-28 HISTORY — DX: Other cerebral infarction due to occlusion or stenosis of small artery: I63.81

## 2015-04-28 LAB — COMPLETE METABOLIC PANEL WITH GFR
ALT: 7 U/L (ref 6–29)
AST: 14 U/L (ref 10–35)
Albumin: 4.5 g/dL (ref 3.6–5.1)
Alkaline Phosphatase: 60 U/L (ref 33–130)
BUN: 15 mg/dL (ref 7–25)
CO2: 21 mmol/L (ref 20–31)
Calcium: 9.6 mg/dL (ref 8.6–10.4)
Chloride: 102 mmol/L (ref 98–110)
Creat: 0.66 mg/dL (ref 0.60–0.88)
GFR, Est African American: >89 mL/min (ref >=60)
GFR, Est Non African American: 81 mL/min (ref >=60)
Glucose, Bld: 108 mg/dL — ABNORMAL HIGH (ref 65–99)
Potassium: 4.4 mmol/L (ref 3.5–5.3)
Sodium: 137 mmol/L (ref 135–146)
Total Bilirubin: 0.5 mg/dL (ref 0.2–1.2)
Total Protein: 7.2 g/dL (ref 6.1–8.1)

## 2015-04-28 LAB — RPR

## 2015-04-28 LAB — TSH: TSH: 3.11 mIU/L

## 2015-04-28 LAB — VITAMIN B12: Vitamin B-12: 168 pg/mL — ABNORMAL LOW (ref 200–1100)

## 2015-04-28 NOTE — Assessment & Plan Note (Signed)
Viatmin B12 serum level low.  New rediscovery of old problem. No anemia, though there is macrocytosis of RBS and leukopenia may have a contribution from B12 deficiency.  Patient listed with hx of peripheraql neuropathy.  New diagnosis of Dementia.   If compliance is an issue, then would recommend starting with repletion IM vitamin B12 then consider whether to go to maintenance IM vitamin B12 or oral maintenance. If compliance is not an issue then recommend patient start Vitamin B12 1000 to 2000 mcg daily Check monitor for compliance with Vit B12 levels in future.

## 2015-04-28 NOTE — Assessment & Plan Note (Signed)
FAST Stage: Five memory loss and difficulty with complex reasoning decreased problem solving and decreased safety awareness Palliative Performance Score: 50 to 60%.   Behavioral and Psychological Symptoms of Dementia: Agitation, Hoarding Vitamin B12 deficiency present - given duration of dementia I do not expect significant improvement in cognition with repletion of patient's Vitamin B12 levels.  Supportive Interventions for caregiver (support group or individual counseling): Ms Joann HinesDeborah Moore, LCSW, spoke with Ms Joann [name]'s daughter, Joann [name], to discuss how to pursue guardianship.  Ms Joann [name] discussed the case with APS who thought that the patient's ability to perform ADLs was sufficient not to start an investigation.  Physical Activity recommendation: If there is a way to get Ms Joann [name] to participate in [facility] activities during the day, this may help slow the decline in her cognition.   Long-term care planning: If Ms Joann [name] should show further deterioration in her capacities to perform ADLs and she refuses assistance, then the Ms Joann RippleShelby [name] should again pursue guardianship with or without assistance from APS.  Follow up surveillance appointment in 3 to 6 months: 3 months with Dr Joann [name].

## 2015-04-28 NOTE — Assessment & Plan Note (Signed)
Viatmin B12 serum level low.  New rediscovery of old problem. No anemia, though there is macrocytosis of RBS and leukopenia may have a contribution from B12 deficiency  If compliance is an issue, then would recommend starting with repletion IM vitamin B12 then consider whether to go to maintenance IM vitamin B12 or oral maintenance. If compliance is not an issue then recommend patient start Vitamin B12 1000 to 2000 mcg daily Check monitor for compliance with Vit B12 levels in future.

## 2015-04-28 NOTE — Assessment & Plan Note (Signed)
Patient is not taking vitamin D May help prevent falls.  Adherence is an issue May want to ask dgt to provide the Ergocalciferol 50000 IU monthly to patient.
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
## 2015-05-09 ENCOUNTER — Telehealth: Payer: Self-pay | Admitting: Licensed Clinical Social Worker

## 2015-05-09 NOTE — Telephone Encounter (Signed)
CSW received call from patient's daughter Joann [name] to discuss steps for obtaining guardianship for her mother.  CSW provided daughter with information and phone numbers of DSS Guardianship program.  Daughter was appreciative and will follow up with DSS.  Joann [name]. LCSWA Clinical Social Work,  [phone] 10:52 AM

## 2015-10-05 ENCOUNTER — Telehealth: Payer: Self-pay | Admitting: Licensed Clinical Social Worker

## 2015-10-05 ENCOUNTER — Telehealth: Payer: Self-pay | Admitting: Internal Medicine

## 2015-10-05 NOTE — Progress Notes (Addendum)
Phone call from patient's daughter Mitzi DavenportShelby.  States patient continues to live alone and is still functioning with her ADL's.  Per daughter patient has "so much junk in her house" however will not allow anyone to clean it.  Daughter has spoken to DSS in the past. Per daughter she thinks the dementia is getting worse. The following was discussed: follow up with PCP, durable POA, guardianship, long-term options for patient, concerns with landlord inspection of apartment and getting patient to allow family to assist with cleaning.   Plan: Patient's daughter will call DSS to have the conversation of obtaining guardianship for her mother or  with obtaining POA. Daughter will call LCSW if additional assistance is needed.     Sammuel Hineseborah Lynnsey Barbara, LCSW Licensed Clinical Social Worker [facility]   [phone] 1:40 PM

## 2015-10-05 NOTE — Telephone Encounter (Signed)
Spoke with patient's daughter Mitzi DavenportShelby about safety concerns of clutter in the home. She had spoken with SW [name] earlier today. I reiterated advice to call Social Services. Patient is also to have an apartment evaluation by the landlord in 2 weeks, which daughter hopes will help mother decide to allow people to help her rid home of clutter. If neither of these options work, could consider geriatric home visit or home health aid referral for safety assessment. Daughter plans to make appointment for annual physical later this month to discuss dementia further.

## 2015-10-05 NOTE — Telephone Encounter (Signed)
Daughter would like to talk to dr. Laverle PatterMom has recently been diagnosed with dementia.  She would like to discuss living arrangements.  She is needing some help in convincing her mother that her apt needs to be cleaned out and cleaned.  Please call daughter.

## 2015-10-18 ENCOUNTER — Encounter: Payer: Self-pay | Admitting: Internal Medicine

## 2015-11-06 ENCOUNTER — Ambulatory Visit (INDEPENDENT_AMBULATORY_CARE_PROVIDER_SITE_OTHER): Payer: Medicare Other | Admitting: Internal Medicine

## 2015-11-06 ENCOUNTER — Encounter: Payer: Self-pay | Admitting: Internal Medicine

## 2015-11-06 VITALS — BP 137/65 | HR 88 | Temp 98.5°F | Ht 67.0 in | Wt 127.2 lb

## 2015-11-06 DIAGNOSIS — E559 Vitamin D deficiency, unspecified: Secondary | ICD-10-CM | POA: Diagnosis not present

## 2015-11-06 DIAGNOSIS — L298 Other pruritus: Secondary | ICD-10-CM

## 2015-11-06 DIAGNOSIS — F0150 Vascular dementia without behavioral disturbance: Secondary | ICD-10-CM

## 2015-11-06 DIAGNOSIS — M25471 Effusion, right ankle: Secondary | ICD-10-CM

## 2015-11-06 DIAGNOSIS — F0280 Dementia in other diseases classified elsewhere without behavioral disturbance: Secondary | ICD-10-CM

## 2015-11-06 DIAGNOSIS — E538 Deficiency of other specified B group vitamins: Secondary | ICD-10-CM

## 2015-11-06 DIAGNOSIS — N898 Other specified noninflammatory disorders of vagina: Secondary | ICD-10-CM | POA: Diagnosis not present

## 2015-11-06 DIAGNOSIS — G309 Alzheimer's disease, unspecified: Secondary | ICD-10-CM

## 2015-11-06 DIAGNOSIS — R413 Other amnesia: Secondary | ICD-10-CM | POA: Diagnosis not present

## 2015-11-06 DIAGNOSIS — M25472 Effusion, left ankle: Secondary | ICD-10-CM

## 2015-11-06 MED ORDER — CHOLECALCIFEROL 25 MCG (1000 UT) PO CAPS: 1000.0000 [IU] | ORAL_CAPSULE | Freq: Every day | ORAL | 3 refills | Status: DC

## 2015-11-06 MED ORDER — ESTRADIOL 0.1 MG/GM VA CREA: 1.0000 | TOPICAL_CREAM | Freq: Every day | VAGINAL | 3 refills | Status: DC

## 2015-11-06 MED ORDER — B-12 1000 MCG PO TBCR: 1.0000 | EXTENDED_RELEASE_TABLET | Freq: Every day | ORAL | 3 refills | Status: DC

## 2015-11-06 NOTE — Progress Notes (Signed)
[facility] Progress Note  Subjective:  Joann [name] is an 82 y.o. female with mixed Alzheimer's and vascular dementia, osteopenia, vitamin D and B12 deficiency, and tobacco abuse who presents for check-up. She is brought by her daughter, who has concerns about her mother's living situation.  Memory Loss: - Patient can prepare her own meals, dress, toilet and bathe herself. - Daughter does her bills and drives and tries to help clean the house.  - Pt lives alone. Does not leave apartment much -- just goes to mailbox. Daughter would like to have her mother move in with her or her brother but mother refuses and insists on living independently.  - Patient does not like to leave the house and it is difficult to get her to come to doctor's appointments - Daughter has concerns about clutter in home and risk of falls. Has been working with mother to clear apartment. Lives in public housing and has been told by landlord she needs to clean her home.  - Pt not taking any medications regularly. Has history of vitamin B12 and D deficiencies. - Daughter requesting Living Will information.  Vaginal itching: - Pt complains of a couple days' of dryness and itching but rash for a couple of months. - Would like to know what she could use to make this better.    L ankle swelling: - Present for about 1 week - Mother has been complaining of discomfort per daughter - Denies fall or injury  ROS: Positive for trouble hearing, swelling of feet, knee pain, memory problems; negative for chest pain and SOB   Social History Narrative   Patient is one of 11 children   Pt has Daughter, Carlicia Leavens and and son, Armando Lauman, who is disabled and Wheelchair bound.       Advanced Directives: Code Status: Thomasene Ripple, daughter of patient, is Curator for [employer]      Contact: First and Last Name   Toothman,[name] Daughter [phone]   1st point of contact      [facility] Son [phone]      Social: Does not drink. Current smoker.   Objective: Blood pressure 137/65, pulse 88, temperature 98.5 F (36.9 C), temperature source Oral, height 5\' 7"  (1.702 m), weight 57.7 kg (127 lb 3.2 oz). Constitutional: Elderly female, clean, in NAD HENT: MMM, poor dentition Cardiovascular: RRR, S1, S2, no m/r/g.  Pulmonary/Chest: Effort normal and breath sounds normal. No respiratory distress.  Abdominal: Soft. +BS, soft, NT, ND, no rebound or guarding.  Musculoskeletal: 1+ edema of L ankle (medial > lateral malleolus), trace edema of R ankle, no pain with plantar, dorsi flexion or inversion eversion. No bruising or deformity. Neurological: AOx2, no focal deficits. GU: Pink, dry labia majora with few superficial excoriations, no plaques or white patches Vitals reviewed  PHQ-2: 0  Vitamin B12 04/27/15: 168 Vitamin D 09/27/14: 14   Assessment/Plan: Mixed Alzheimer's and vascular dementia - Slow worsening per daughter. Provided information on Namenda, but daughter does not think family will pursue this at this time - Recommended treating vitamin B12 and vitamin D deficiency; difficult for daughter to get patient to doctor's appointments so PO medication preferred, though pt may not take - Prescribed vitamin D 1000 U and cyanocobalamin 1000 mcg daily - Daughter to complete Living Will  Vaginal itching - Recommended Replens vaginal moisturizer, as likely will be easiest treatment for patient to use, but also provided Rx for estrace cream   Swollen ankles -  No pain or limitation of ROM on exam to suggest ankle sprain. Suspect due to some venous insufficiency. - Recommended elevating legs as able when resting at home.   Follow-up as needed.   Dani GobbleHillary Czar Ysaguirre, MD [facility], PGY-2

## 2015-11-06 NOTE — Patient Instructions (Signed)
[name],  I recommend 1000 mcg of B12 daily and 1000 U of vitamin D daily.  I have prescribed an estrogen cream to use once nightly for the next two weeks. You can continue twice weekly thereafter. You may also try an over the counter lubricant like Replens. I recommend increasing water intake.  Best, Dr. Sampson GoonFitzgerald  Memantine extended release capsules What is this medicine? MEMANTINE (MEM an teen) is used to treat dementia caused by Alzheimer's disease. This medicine may be used for other purposes; ask your health care provider or pharmacist if you have questions. What should I tell my health care provider before I take this medicine? They need to know if you have any of these conditions: -difficulty passing urine -kidney disease -liver disease -seizures -an unusual or allergic reaction to memantine, other medicines, foods, dyes, or preservatives -pregnant or trying to get pregnant -breast-feeding How should I use this medicine? Take this medicine by mouth with a glass of water. Follow the directions on the prescription label. You may take this medicine with or without food. You may swallow the capsules whole or open them and sprinkle the entire contents on applesauce before swallowing. Other than sprinkling the medicine on applesauce, the capsules should be swallowed whole and not divided, chewed, or crushed. Take your doses at regular intervals. Do not take your medicine more often than directed. Continue to take your medicine even if you feel better. Do not stop taking except on the advice of your doctor or health care professional. Talk to your pediatrician regarding the use of this medicine in children. Special care may be needed. Overdosage: If you think you have taken too much of this medicine contact a poison control center or emergency room at once. NOTE: This medicine is only for you. Do not share this medicine with others. What if I miss a dose? If you miss a dose, take it as  soon as you can. If it is almost time for your next dose, take only that dose. Do not take double or extra doses. If you do not take your medicine for several days, contact your health care provider. Your dose may need to be changed. What may interact with this medicine? -acetazolamide -amantadine -cimetidine -dextromethorphan -dofetilide -hydrochlorothiazide -ketamine -metformin -methazolamide -quinidine -ranitidine -sodium bicarbonate -triamterene This list may not describe all possible interactions. Give your health care provider a list of all the medicines, herbs, non-prescription drugs, or dietary supplements you use. Also tell them if you smoke, drink alcohol, or use illegal drugs. Some items may interact with your medicine. What should I watch for while using this medicine? Visit your doctor or health care professional for regular checks on your progress. Check with your doctor or health care professional if there is no improvement in your symptoms or if they get worse. You may get drowsy or dizzy. Do not drive, use machinery, or do anything that needs mental alertness until you know how this drug affects you. Do not stand or sit up quickly, especially if you are an older patient. This reduces the risk of dizzy or fainting spells. Alcohol can make you more drowsy and dizzy. Avoid alcoholic drinks. What side effects may I notice from receiving this medicine? Side effects that you should report to your doctor or health care professional as soon as possible: -agitation or a feeling of restlessness -allergic reactions like skin rash, itching or hives, swelling of the face, lips, or tongue -depressed mood -dizziness -hallucinations -redness, blistering, peeling or  loosening of the skin, including inside the mouth -seizures -vomiting Side effects that usually do not require medical attention (Report these to your doctor or health care professional if they continue or are  bothersome.): -constipation -diarrhea -headache -nausea -trouble sleeping This list may not describe all possible side effects. Call your doctor for medical advice about side effects. You may report side effects to FDA at 1-800-FDA-1088. Where should I keep my medicine? Keep out of the reach of children. Store at room temperature between 15 degrees and 30 degrees C (59 degrees and 86 degrees F). Throw away any unused medicine after the expiration date. NOTE: This sheet is a summary. It may not cover all possible information. If you have questions about this medicine, talk to your doctor, pharmacist, or health care provider.    2016, Elsevier/Gold Standard. (2012-11-02 13:59:18)

## 2015-11-09 DIAGNOSIS — I872 Venous insufficiency (chronic) (peripheral): Secondary | ICD-10-CM

## 2015-11-09 DIAGNOSIS — M25472 Effusion, left ankle: Secondary | ICD-10-CM

## 2015-11-09 DIAGNOSIS — M25471 Effusion, right ankle: Secondary | ICD-10-CM | POA: Insufficient documentation

## 2015-11-09 HISTORY — DX: Venous insufficiency (chronic) (peripheral): I87.2

## 2015-11-09 NOTE — Assessment & Plan Note (Signed)
-   No pain or limitation of ROM on exam to suggest ankle sprain. Suspect due to some venous insufficiency. - Recommended elevating legs as able when resting at home.

## 2015-11-09 NOTE — Assessment & Plan Note (Addendum)
-   Slow worsening per daughter. Provided information on Namenda, but daughter does not think family will pursue this at this time - Recommended treating vitamin B12 and vitamin D deficiency; difficult for daughter to get patient to doctor's appointments so PO medication preferred, though pt may not take - Prescribed vitamin D 1000 U and cyanocobalamin 1000 mcg daily - Daughter to complete Living Will; has spoken with [facility] about getting help from [employer] as needed for home safety concerns; plans to de-clutter her mother's home incrementally

## 2015-11-09 NOTE — Assessment & Plan Note (Signed)
-   Recommended Replens vaginal moisturizer, as likely will be easiest treatment for patient to use, but also provided Rx for estrace cream

## 2015-12-27 ENCOUNTER — Encounter: Payer: Self-pay | Admitting: Podiatry

## 2015-12-27 ENCOUNTER — Ambulatory Visit (INDEPENDENT_AMBULATORY_CARE_PROVIDER_SITE_OTHER): Payer: Medicare Other | Admitting: Podiatry

## 2015-12-27 ENCOUNTER — Ambulatory Visit (INDEPENDENT_AMBULATORY_CARE_PROVIDER_SITE_OTHER): Payer: Medicare Other

## 2015-12-27 DIAGNOSIS — M79671 Pain in right foot: Secondary | ICD-10-CM

## 2015-12-27 DIAGNOSIS — B351 Tinea unguium: Secondary | ICD-10-CM

## 2015-12-27 DIAGNOSIS — L84 Corns and callosities: Secondary | ICD-10-CM | POA: Diagnosis not present

## 2015-12-27 DIAGNOSIS — M79605 Pain in left leg: Secondary | ICD-10-CM | POA: Diagnosis not present

## 2015-12-27 DIAGNOSIS — M79604 Pain in right leg: Secondary | ICD-10-CM

## 2015-12-27 DIAGNOSIS — M2041 Other hammer toe(s) (acquired), right foot: Secondary | ICD-10-CM | POA: Diagnosis not present

## 2015-12-27 NOTE — Progress Notes (Signed)
   Subjective:    Patient ID: Joann [name], female    DOB: 08/28/1927, 82 y.o.   MRN: [id number]  HPI Chief Complaint  Patient presents with  . Debridement    Bilateral nail trim      Review of Systems  All other systems reviewed and are negative.      Objective:   Physical Exam        Assessment & Plan:

## 2015-12-28 NOTE — Progress Notes (Signed)
Subjective:     Patient ID: Joann [name], female   DOB: 05/08/1927, 82 y.o.   MRN: [id number]  HPI patient presents with significant nail disease 1-5 both feet that are thickened and incurvated and severe lesion fifth digit right that's painful. States she cannot cut her nails or lesion and they're becoming increasingly painful   Review of Systems  All other systems reviewed and are negative.      Objective:   Physical Exam  Constitutional: She is oriented to person, place, and time.  Cardiovascular: Intact distal pulses.   Musculoskeletal: Normal range of motion.  Neurological: She is oriented to person, place, and time.  Skin: Skin is warm.  Nursing note and vitals reviewed.  Vascular status mildly diminished but intact with patient noted to have mild nerve irritation bilateral and is found to have severe keratotic lesion fifth digit right that's painful when pressed and nail disease with thickness and incurvation 1-5 both feet that are painful. Patient's found to have good digital perfusion and is well oriented 3     Assessment:     Significant digital deformity fifth right with keratotic lesion and nail disease 1-5 both feet that are thickened and painful when palpated    Plan:     H&P conditions reviewed and aggressively debrided nailbeds 1-5 both feet and careful lesion debridement fifth digit right with no iatrogenic bleeding noted. Patient is to be seen back for routine care and was given instructions on wider shoes

## 2016-01-04 ENCOUNTER — Encounter: Payer: Self-pay | Admitting: Internal Medicine

## 2016-01-04 ENCOUNTER — Ambulatory Visit (INDEPENDENT_AMBULATORY_CARE_PROVIDER_SITE_OTHER): Payer: Medicare Other | Admitting: Internal Medicine

## 2016-01-04 VITALS — BP 124/68 | HR 84 | Temp 98.5°F | Wt 127.0 lb

## 2016-01-04 DIAGNOSIS — R350 Frequency of micturition: Secondary | ICD-10-CM

## 2016-01-04 DIAGNOSIS — R109 Unspecified abdominal pain: Secondary | ICD-10-CM

## 2016-01-04 DIAGNOSIS — N3001 Acute cystitis with hematuria: Secondary | ICD-10-CM | POA: Diagnosis not present

## 2016-01-04 LAB — POCT URINALYSIS DIPSTICK
Bilirubin, UA: NEGATIVE
Glucose, UA: NEGATIVE
Ketones, UA: NEGATIVE
Nitrite, UA: POSITIVE
Protein, UA: NEGATIVE
Spec Grav, UA: 1.015
Urobilinogen, UA: 2
pH, UA: 6.5

## 2016-01-04 LAB — POCT UA - MICROSCOPIC ONLY: Epithelial cells, urine per micros: >20

## 2016-01-04 MED ORDER — CEPHALEXIN 500 MG PO CAPS: 500.0000 mg | ORAL_CAPSULE | Freq: Four times a day (QID) | ORAL | 0 refills | Status: DC

## 2016-01-04 NOTE — Patient Instructions (Signed)
We decided on treating for a presumed urinary tract infection since Joann [name] is not able to give a urine sample. The medication is Keflex. She will need to take 1 tablet four times a day for 7 days. If she is unable to urinate today even with drinking fluids, she needs to be seen by a medical provider. Please return to clinic if her symptoms do not improve with antibiotics.

## 2016-01-04 NOTE — Progress Notes (Signed)
   [facility] Phone: [phone]   Date of Visit: 01/04/2016   HPI:  Salomon FickOlive [name] is a 82 y.o. female presenting to clinic today for same day appointment. PCP: Jamelle HaringHillary M Fitzgerald, MD Concerns today include: - reports of intermittent lower abdominal pain with pain in left flank  - no dysuria but notes of increased frequency, no hematuria, urgency or hesitancy.  - unclear of the length of time but possibly for 3-4 days. Patient was brought by her daughter who reports patient has dementia  - no fevers at home, no nausea or vomiting. Has been eating and drinking like usual. - no diarrhea or constipation. Has a BM daily. No blood in stool - has a history of UTI in 03/2015 and 09/2014. Had CT renal stone study done in 03/2015 which did not show any signs of nephrolithiasis.  - no vaginal discharge or bleeding - No history of kidnery stones   ROS: See HPI.  PMFSH:  Dementia Hx UTI  PHYSICAL EXAM: BP 124/68   Pulse 84   Temp 98.5 F (36.9 C) (Oral)   Wt 127 lb (57.6 kg)   SpO2 99%   BMI 19.89 kg/m  GEN: NAD, pleasant, non-toxic in appearance  CV: RRR, no murmurs, rubs, or gallops PULM: CTAB, normal effort Back: no CVA tenderness ABD: Soft, tender in the suprapubic region, LLQ, and left flank area without guarding. No rebound. + BS SKIN: No rash or cyanosis; warm and well-perfused EXTR: No lower extremity edema or calf tenderness PSYCH: Mood and affect euthymic, normal rate and volume of speech NEURO: Awake, alert, no focal deficits grossly, normal speech.  ASSESSMENT/PLAN:  1. Acute cystitis with hematuria: UA with small LE and positive nitrites, trace intact blood. Microscopy with 5-10 wbc, 3+ bacteria, 0-3 RBC, >20 squamous cells. Urine culture added. Prior urine culture sensitive to cephalosporin. Prescribed Keflex 500mg  QID x 7 days. Vitals stable and afebrile. - return precautions discussed with daughter - Urinalysis Dipstick - POCT UA -  Microscopic Only - Urine culture  Of note, daughter reports patient's dementia has worsened overtime and she is unsure if patient is able to care for herself and live on her own. She reports that she will speak to Ms. Christell ConstantMoore (CSW)  Palma HolterKanishka G Jakerria Kingbird, MD PGY 2 [facility]

## 2016-01-07 LAB — URINE CULTURE

## 2016-01-24 ENCOUNTER — Telehealth: Payer: Self-pay | Admitting: Licensed Clinical Social Worker

## 2016-01-24 NOTE — Progress Notes (Signed)
LCSW received phone call from patient's daughter Joann [name].   She has spoken to patient who is now in agreement for placement.  They would like placement within the next month.  LCSW reviewed placement process with daughter to include payment options, FL2 and selecting a facility.    LCSW provided PCP with an update on patient and daughter's request.   Plan:   1. PCP will complete FL2 2. Daughter will contact DSS to review Medicaid for ALF placement 3. LCSW will provided daughter with a list of ALF and family care homes to visit 4. Daughter will call LCSW for assistance and support until placement is complete  Joann Hineseborah Jannel Lynne, LCSW Licensed Clinical Social Worker [facility]   [phone] 2:33 PM

## 2016-01-26 ENCOUNTER — Other Ambulatory Visit: Payer: Self-pay | Admitting: Internal Medicine

## 2016-01-26 ENCOUNTER — Telehealth: Payer: Self-pay | Admitting: Licensed Clinical Social Worker

## 2016-01-26 NOTE — Progress Notes (Signed)
LCSW received completed FL2 for ALF placement from Dr. Sampson GoonFitzgerald, a copy was placed in the scan box to be scanned into Epic.  Called patient's daughter Joann [name] to inform her FL2 has been completed.    Plan: Patient's daughter will pick up FL2, a list of facilities and a brochure with information on Medicaid at the front desk.   Joann Hineseborah Sravya Grissom, LCSW Licensed Clinical Social Worker [facility]   [phone] 2:10 PM

## 2016-02-09 ENCOUNTER — Telehealth: Payer: Self-pay | Admitting: Licensed Clinical Social Worker

## 2016-02-09 ENCOUNTER — Encounter: Payer: Self-pay | Admitting: Licensed Clinical Social Worker

## 2016-02-09 NOTE — Progress Notes (Signed)
Follow up call to patient's daughter Joann [name] for update on placement.   Per [name], patient has decided she does not want to go to ALF.  Discussed other supportive options with daughter to include PACE, Personal Care Services and adult day centers.  Patient does not want to go nor have anyone come to her home.  Daughter is frustrated, LCSW provided emotional support, discussed seeking legal advice or contacting DSS to have a converation about guardianship for patient.  Also shared information on Dementia support groups for families.  Daughter has decided that placement for patient is on hold at this time.  Update provided to PCP.  Plan: Daughter will contact LCSW for information or assistance as needed  [name], LCSW Licensed Clinical Social Worker [facility]   [phone] 10:37 AM

## 2016-09-03 ENCOUNTER — Ambulatory Visit (INDEPENDENT_AMBULATORY_CARE_PROVIDER_SITE_OTHER): Payer: Medicare Other | Admitting: Internal Medicine

## 2016-09-03 VITALS — BP 124/82 | HR 83 | Temp 98.2°F | Ht 67.0 in | Wt 135.0 lb

## 2016-09-03 DIAGNOSIS — F17200 Nicotine dependence, unspecified, uncomplicated: Secondary | ICD-10-CM

## 2016-09-03 DIAGNOSIS — Z0000 Encounter for general adult medical examination without abnormal findings: Secondary | ICD-10-CM

## 2016-09-03 NOTE — Patient Instructions (Addendum)
Ms. Spath,  I think you are doing very well.  We could check your blood counts, but if you don't want to take a vitamin, then we can hold off.  I would recommend getting the flu shot in the fall because you can get very sick from that virus.  Best, Dr. Sampson Goon  Pneumococcal Polysaccharide Vaccine: What You Need to Know 1. Why get vaccinated? Vaccination can protect older adults (and some children and younger adults) from pneumococcal disease. Pneumococcal disease is caused by bacteria that can spread from person to person through close contact. It can cause ear infections, and it can also lead to more serious infections of the:  Lungs (pneumonia),  Blood (bacteremia), and  Covering of the brain and spinal cord (meningitis). Meningitis can cause deafness and brain damage, and it can be fatal.  Anyone can get pneumococcal disease, but children under 30 years of age, people with certain medical conditions, adults over 75 years of age, and cigarette smokers are at the highest risk. About 18,000 older adults die each year from pneumococcal disease in the [place]. Treatment of pneumococcal infections with penicillin and other drugs used to be more effective. But some strains of the disease have become resistant to these drugs. This makes prevention of the disease, through vaccination, even more important. 2. Pneumococcal polysaccharide vaccine (PPSV23) Pneumococcal polysaccharide vaccine (PPSV23) protects against 23 types of pneumococcal bacteria. It will not prevent all pneumococcal disease. PPSV23 is recommended for:  All adults 18 years of age and older,  Anyone 2 through 81 years of age with certain long-term health problems,  Anyone 2 through 81 years of age with a weakened immune system,  Adults 3 through 81 years of age who smoke cigarettes or have asthma.  Most people need only one dose of PPSV. A second dose is recommended for certain high-risk groups. People 69 and  older should get a dose even if they have gotten one or more doses of the vaccine before they turned 65. Your healthcare provider can give you more information about these recommendations. Most healthy adults develop protection within 2 to 3 weeks of getting the shot. 3. Some people should not get this vaccine  Anyone who has had a life-threatening allergic reaction to PPSV should not get another dose.  Anyone who has a severe allergy to any component of PPSV should not receive it. Tell your provider if you have any severe allergies.  Anyone who is moderately or severely ill when the shot is scheduled may be asked to wait until they recover before getting the vaccine. Someone with a mild illness can usually be vaccinated.  Children less than 2 years of age should not receive this vaccine.  There is no evidence that PPSV is harmful to either a pregnant woman or to her fetus. However, as a precaution, women who need the vaccine should be vaccinated before becoming pregnant, if possible. 4. Risks of a vaccine reaction With any medicine, including vaccines, there is a chance of side effects. These are usually mild and go away on their own, but serious reactions are also possible. About half of people who get PPSV have mild side effects, such as redness or pain where the shot is given, which go away within about two days. Less than 1 out of 100 people develop a fever, muscle aches, or more severe local reactions. Problems that could happen after any vaccine:  People sometimes faint after a medical procedure, including vaccination. Sitting or lying down  for about 15 minutes can help prevent fainting, and injuries caused by a fall. Tell your doctor if you feel dizzy, or have vision changes or ringing in the ears.  Some people get severe pain in the shoulder and have difficulty moving the arm where a shot was given. This happens very rarely.  Any medication can cause a severe allergic reaction. Such  reactions from a vaccine are very rare, estimated at about 1 in a million doses, and would happen within a few minutes to a few hours after the vaccination. As with any medicine, there is a very remote chance of a vaccine causing a serious injury or death. The safety of vaccines is always being monitored. For more information, visit: http://floyd.org/www.cdc.gov/vaccinesafety/ 5. What if there is a serious reaction? What should I look for? Look for anything that concerns you, such as signs of a severe allergic reaction, very high fever, or unusual behavior. Signs of a severe allergic reaction can include hives, swelling of the face and throat, difficulty breathing, a fast heartbeat, dizziness, and weakness. These would usually start a few minutes to a few hours after the vaccination. What should I do? If you think it is a severe allergic reaction or other emergency that can't wait, call 9-1-1 or get to the nearest hospital. Otherwise, call your doctor. Afterward, the reaction should be reported to the Vaccine Adverse Event Reporting System (VAERS). Your doctor might file this report, or you can do it yourself through the VAERS web site at www.vaers.LAgents.nohhs.gov, or by calling 1-813-562-9980. VAERS does not give medical advice. 6. How can I learn more?  Ask your doctor. He or she can give you the vaccine package insert or suggest other sources of information.  Call your local or state health department.  Contact the Centers for Disease Control and Prevention (CDC): ? Call 774-628-35291-(705)219-5629 (1-800-CDC-INFO) or ? Visit CDC's website at PicCapture.uywww.cdc.gov/vaccines CDC Pneumococcal Polysaccharide Vaccine VIS (05/21/13) This information is not intended to replace advice given to you by your health care provider. Make sure you discuss any questions you have with your health care provider. Document Released: 11/11/2005 Document Revised: 10/05/2015 Document Reviewed: 10/05/2015 Elsevier Interactive Patient Education  2017 [employer]

## 2016-09-06 ENCOUNTER — Encounter: Payer: Self-pay | Admitting: Internal Medicine

## 2016-09-06 DIAGNOSIS — Z0000 Encounter for general adult medical examination without abnormal findings: Secondary | ICD-10-CM | POA: Insufficient documentation

## 2016-09-06 NOTE — Progress Notes (Signed)
[facility] Progress Note  Subjective:  Joann FickOlive [name] is a 83 y.o. female with dementia, HLD, L knee pain, pernicious anemia, osteopenia, history of CVA, and tobacco abuse who presents for check-up.  She is accompanied by her daughter Joann [name].  Both patient and daughter deny concerns today. Daughter asks if she needs blood work.  Daughter had been trying to have mother move to ALF towards end of last year, but patient refused. She continues to live alone in an apartment with daughter bringing over most meals for the week. Daughter takes care of all iADLs and feels home situation is safe at present and is not pursing guardianship. Daughter cleaned mother's apartment of clutter this spring, and patient says she gets around better now. She uses a walker and a cane to help her get around. Enjoys getting breakfast at a restaurant down the street. Declines personal care services, as does not like strangers in her home.  #History of B12 deficiency: - Does not like taking pills; did not take supplements when prescribed and has declined shots - No recent falls - Denies changes in balance or increased fatigue - Only mildly low 03/2015 at 168. MCV still elevated 03/2015 on CBC at 101.4.  - Reports she would not take supplementation if low again  #Dementia: - Managing living alone with daughter's support - Daughter has not noticed any significant declines   #Tobacco abuse: - Smoking less than 1 PPD  Health Maintenance: DEXA scan, PCV13  ROS: Complete ROS negative except for memory problems.  Social: Current smoker. Has used marijuana before but not currently. Denies problem with moods.   Allergies  Allergen Reactions  . Sulfonamide Derivatives Other (See Comments)    Cannot remember reaction - pt states it did not make her feel well   Objective: Blood pressure 124/82, pulse 83, temperature 98.2 F (36.8 C), temperature source Oral, height 5\' 7"  (1.702 m), weight 135 lb (61.2  kg). Body mass index is 21.14 kg/m. Constitutional: Well kempt older female in NAD HENT: MMM, dentures Cardiovascular: RRR, S1, S2, no m/r/g.  Pulmonary/Chest: Effort normal and breath sounds normal. No respiratory distress.  Abdominal: Soft. +BS, NT Musculoskeletal: No LE edema. Bilateral crepitus of knees. Neurological: AOx3 (person, place, time), no focal deficits. Skin: Skin is warm and dry. No rash noted.  Psychiatric: Somewhat flat affect but interactive.  Vitals reviewed  Assessment/Plan: Healthcare maintenance - Patient not interested in taking medications or receiving vaccinations at this time. Daughter says she is stubborn and will not take anything. Given age and opinion about not taking medications, do not feel obtaining labs or dexa scan would provide any useful information, especially in setting of patient feeling generally well.  - Counseled patient that flu and pneumonia vaccines could prevent respiratory infections from being as severe as they can be. Provided handout on pneumococal vaccine. She says she will think about this. - Encouraged staying as active as possible and why this important for maintaining muscle mass and remaining independent  TOBACCO DEPENDENCE - Not interested in quitting at this time.   Follow-up prn.  Dani GobbleHillary Rodolph Hagemann, MD [facility], PGY-3

## 2016-09-06 NOTE — Assessment & Plan Note (Addendum)
-   Patient not interested in taking medications or receiving vaccinations at this time. Daughter says she is stubborn and will not take anything. Given age and opinion about not taking medications, do not feel obtaining labs or dexa scan would provide any useful information, especially in setting of patient feeling generally well.  - Counseled patient that flu and pneumonia vaccines could prevent respiratory infections from being as severe as they can be. Provided handout on pneumococal vaccine. She says she will think about this. - Encouraged staying as active as possible and why this important for maintaining muscle mass and remaining independent

## 2016-09-06 NOTE — Assessment & Plan Note (Signed)
Not interested in quitting at this time

## 2016-11-21 ENCOUNTER — Ambulatory Visit: Payer: Medicaid Other | Admitting: Podiatry

## 2017-06-27 ENCOUNTER — Ambulatory Visit (INDEPENDENT_AMBULATORY_CARE_PROVIDER_SITE_OTHER): Payer: Medicare Other | Admitting: Podiatry

## 2017-06-27 ENCOUNTER — Encounter: Payer: Self-pay | Admitting: Podiatry

## 2017-06-27 DIAGNOSIS — R460 Very low level of personal hygiene: Secondary | ICD-10-CM

## 2017-06-27 DIAGNOSIS — G609 Hereditary and idiopathic neuropathy, unspecified: Secondary | ICD-10-CM

## 2017-06-27 DIAGNOSIS — E1142 Type 2 diabetes mellitus with diabetic polyneuropathy: Secondary | ICD-10-CM | POA: Diagnosis not present

## 2017-06-27 DIAGNOSIS — B351 Tinea unguium: Secondary | ICD-10-CM | POA: Diagnosis not present

## 2017-06-27 DIAGNOSIS — M79674 Pain in right toe(s): Secondary | ICD-10-CM

## 2017-06-27 DIAGNOSIS — M79675 Pain in left toe(s): Secondary | ICD-10-CM

## 2017-06-27 DIAGNOSIS — I739 Peripheral vascular disease, unspecified: Secondary | ICD-10-CM | POA: Diagnosis not present

## 2017-06-27 DIAGNOSIS — E1151 Type 2 diabetes mellitus with diabetic peripheral angiopathy without gangrene: Secondary | ICD-10-CM | POA: Diagnosis not present

## 2017-06-28 ENCOUNTER — Encounter: Payer: Self-pay | Admitting: Podiatry

## 2017-06-28 NOTE — Progress Notes (Signed)
Subjective: Ms. Joann [name] presents today with a friend with  cc of painful, discolored, thick toenails which interfere with activities of daily living. Pain is aggravated when wearing enclosed shoe gear. Pain is getting progressively worse and relieved with periodic professional debridement. Pt has h/o peripheral neuropathy  Objective: Vascular Examination: Capillary refill time <3 seconds x 10 digits Dorsalis pedis diminished b/l Posterior tibial pulses faintly palpable b/l No digital hair x 10 digits Skin temperature warm to cool b/l  Dermatological Examination: Skin thin and atrophic b/l Toenails 1-5 b/l discolored, thick, dystrophic with subungual debris and pain with palpation to nailbeds due to thickness of nails.  Evidence of poor pedal hygiene with debris noted interdigitally and dirt on feet plantarly.  Musculoskeletal: Muscle strength 5/5 to all LE muscle groups  Neurological: Sensation intact with 10 gram monofilament.  Assessment: Painful onychomycosis toenails 1-5 b/l  PAD Peripheral neuropathy Poor pedal hygiene  Plan: 1. Toenails 1-5 b/l were debrided in length and girth without iatrogenic bleeding. 2. Feet cleaned today 3. Instruction sheet given to soak feet once weekly, dry between toes and apply moisturizer. 4. Patient to continue soft, supportive shoe gear 5. POA to report any pedal injuries to medical professional immediately. 6. Follow up 3 months.  7. POA to call should there be a concern in the interim.

## 2017-09-26 ENCOUNTER — Ambulatory Visit: Payer: Medicare Other | Admitting: Podiatry

## 2018-07-28 ENCOUNTER — Other Ambulatory Visit: Payer: Self-pay

## 2018-07-28 ENCOUNTER — Ambulatory Visit (INDEPENDENT_AMBULATORY_CARE_PROVIDER_SITE_OTHER): Payer: Medicare Other | Admitting: Podiatry

## 2018-07-28 ENCOUNTER — Encounter: Payer: Self-pay | Admitting: Podiatry

## 2018-07-28 VITALS — Temp 97.2°F

## 2018-07-28 DIAGNOSIS — M79675 Pain in left toe(s): Secondary | ICD-10-CM

## 2018-07-28 DIAGNOSIS — B351 Tinea unguium: Secondary | ICD-10-CM | POA: Diagnosis not present

## 2018-07-28 DIAGNOSIS — M79674 Pain in right toe(s): Secondary | ICD-10-CM

## 2018-07-28 NOTE — Patient Instructions (Signed)

## 2018-08-01 NOTE — Progress Notes (Signed)
Subjective:  Joann [name] presents to clinic today with cc of  painful, thick, discolored, elongated toenails 1-5 b/l that become tender and cannot cut because of thickness.  Pain is aggravated when wearing enclosed shoe gear.  Gerlene Fee, MD is her PCP.    Current Outpatient Medications:  .  acetaminophen (TYLENOL) 500 MG tablet, Take 1,000 mg by mouth 2 (two) times daily as needed for mild pain. Thursday, Disp: , Rfl:  .  Cholecalciferol 1000 units capsule, Take 1 capsule (1,000 Units total) by mouth daily., Disp: 90 capsule, Rfl: 3 .  Cyanocobalamin (B-12) 1000 MCG TBCR, Take 1 tablet by mouth daily., Disp: 90 tablet, Rfl: 3   Allergies  Allergen Reactions  . Sulfonamide Derivatives Other (See Comments)    Cannot remember reaction - pt states it did not make her feel well     Objective: Vitals:   07/28/18 0959  Temp: (!) 97.2 F (36.2 C)    Physical Examination:  Vascular Examination: Capillary refill time <3 seconds x 10.  DP pulses diminished b/l.  PT pulses faintly palpable b/l.  Digital hair absent b/l.  No edema noted b/l.  Skin temperature gradient warm to cool b/l.  Dermatological Examination: Skin thin, shiny and atrophic b/l.  No open wounds b/l.  No interdigital macerations noted b/l.  Elongated, thick, discolored brittle toenails with subungual debris and pain on dorsal palpation of nailbeds 1-5 b/l.  Musculoskeletal Examination: Muscle strength 5/5 to all muscle groups b/l  No pain, crepitus or joint discomfort with active/passive ROM.  Neurological Examination: Sensation intact 5/5 b/l with 10 gram monofilament.  Assessment: Mycotic nail infection with pain 1-5 b/l  Plan: 1. Toenails 1-5 b/l were debrided in length and girth without iatrogenic laceration. 2.  Continue soft, supportive shoe gear daily. 3.  Report any pedal injuries to medical professional. 4.  Follow up 3 months. 5.  Patient/POA to call should there be a  question/concern in there interim.

## 2018-08-26 ENCOUNTER — Encounter: Payer: Self-pay | Admitting: Family Medicine

## 2018-08-26 ENCOUNTER — Other Ambulatory Visit: Payer: Self-pay

## 2018-08-26 ENCOUNTER — Ambulatory Visit (INDEPENDENT_AMBULATORY_CARE_PROVIDER_SITE_OTHER): Payer: Medicare Other | Admitting: Family Medicine

## 2018-08-26 VITALS — BP 110/80 | HR 81 | Wt 126.4 lb

## 2018-08-26 DIAGNOSIS — G4710 Hypersomnia, unspecified: Secondary | ICD-10-CM

## 2018-08-26 DIAGNOSIS — R7309 Other abnormal glucose: Secondary | ICD-10-CM

## 2018-08-26 DIAGNOSIS — Z7689 Persons encountering health services in other specified circumstances: Secondary | ICD-10-CM | POA: Diagnosis not present

## 2018-08-26 DIAGNOSIS — R5383 Other fatigue: Secondary | ICD-10-CM

## 2018-08-26 DIAGNOSIS — Y9384 Activity, sleeping: Secondary | ICD-10-CM | POA: Diagnosis not present

## 2018-08-26 HISTORY — DX: Hypersomnia, unspecified: G47.10

## 2018-08-26 NOTE — Progress Notes (Signed)
  Patient Name: Joann [name] Date of Birth: 1928/01/01 Date of Visit: 08/26/18 PCP: Gerlene Fee, DO  Chief Complaint: increased sleeping   Subjective: Joann [name] is a pleasant 85 y.o. with medical history significant for history of stroke, osteopenia, neuropathy, pernicious anemia, HLD and vitamin D and B12 deficiency presenting today with her daughter Joann [name]) for concerns of oversleeping. Daughter reports that she has noticed that her mother lies in bed more often than she used to. Patient reports that she feels well, denies fatigue, SOB, palpitations, HA, dizziness, weakness, loss of interests, increased guilt, decreased appetite, dysuria, abdominal pain, nausea, diarrhea, constipation, joint pain or cold intolerance. Patient's daughter reports that her mother does well with notifying her is she is in pain or experiencing any new symptoms but given history of dementia and usual unwillingness to go to the doctor's office she would feel more comfortable with some lab work being done.    ROS: Per HPI.   I have reviewed the patient's medical, surgical, family, and social history as appropriate.  Vitals:   08/26/18 1341  BP: 110/80  Pulse: 81  SpO2: 98%   Physical Exam  Constitutional: No distress.  HENT:  Head: Normocephalic and atraumatic.  Eyes: Pupils are equal, round, and reactive to light. Conjunctivae and EOM are normal. Right eye exhibits no discharge. Left eye exhibits no discharge. No scleral icterus.  Neck: Normal range of motion. Neck supple. No thyromegaly present.  Cardiovascular: Normal rate, regular rhythm, normal heart sounds and intact distal pulses. Exam reveals no gallop and no friction rub.  No murmur heard. Respiratory: Effort normal and breath sounds normal. No respiratory distress. She has no wheezes. She has no rales.  GI: Soft. Bowel sounds are normal. She exhibits no distension. There is no abdominal tenderness.  Musculoskeletal: Normal range of motion.       General: Edema present. No tenderness.     Comments: Minimal bilateral LE edema  Lymphadenopathy:    She has no cervical adenopathy.  Neurological: She is alert.  Patient oriented to self, place and situation but not oriented to time   A/P  Increased sleeping Patient presents with daughter for increased "lying in bed all day". Patient denies fatigue during review of systems but given history of anemia patient was questioned about dizziness, palpitations and shortness of breath. Patient denies all and states that she feels well. Daughter is accompanying patient and asks for full physical since her mother has not been seen since 2017.  Patient denies urinary symptoms so we discussed recommendations against collecting U/A. Labwork completed today included:  -CMP -CBC -Vitamin B12 levels -MMA  -TSH   -counseled patient to follow up in 3 months with PCP     Stark Klein, MD  [place]   PGY-1

## 2018-08-26 NOTE — Assessment & Plan Note (Signed)
Patient presents with daughter for increased "lying in bed all day". Patient denies fatigue during review of systems but given history of anemia patient was questioned about dizziness, palpitations and shortness of breath. Patient denies all and states that she feels well. Daughter is accompanying patient and asks for full physical since her mother has not been seen since 2017.  Patient denies urinary symptoms so we discussed recommendations against collecting U/A. Labwork completed today included:  -CMP -CBC -Vitamin B12 levels -MMA  -TSH   -counseled patient to follow up in 3 months with PCP

## 2018-08-26 NOTE — Patient Instructions (Addendum)
It was a pleasure meeting you and your daughter today. Thank you for allowing me to take part in your care.   Today, you were seen for a physical and had lab work done. Either myself or one of the staff members here will call you with any abnormal results. We measured your electrolyte levels, blood cell counts, Vitamin B12 levels, and thyroid levels.   Please continue to take your Vitamin D and B12 supplements and consume a diet with various fruits and vegetables.  Please return to care if you begin to feel sick, have shortness of breath, increased dizziness, falls or chest pain.   Please follow up with your primary care physician in 3 months.   Feel free to return to care if you become more tired or feel dizzy.   Thank you,   Dr. Rosita Fire

## 2018-08-30 LAB — COMPREHENSIVE METABOLIC PANEL
ALT: 12 IU/L (ref 0–32)
AST: 20 IU/L (ref 0–40)
Albumin/Globulin Ratio: 2.6 — ABNORMAL HIGH (ref 1.2–2.2)
Albumin: 4.5 g/dL (ref 3.6–4.6)
Alkaline Phosphatase: 50 IU/L (ref 39–117)
BUN/Creatinine Ratio: 18 (ref 12–28)
BUN: 16 mg/dL (ref 8–27)
Bilirubin Total: 1.2 mg/dL (ref 0.0–1.2)
CO2: 21 mmol/L (ref 20–29)
Calcium: 9 mg/dL (ref 8.7–10.3)
Chloride: 102 mmol/L (ref 96–106)
Creatinine, Ser: 0.9 mg/dL (ref 0.57–1.00)
GFR calc Af Amer: 66 mL/min/{1.73_m2} (ref >59)
GFR calc non Af Amer: 57 mL/min/{1.73_m2} — ABNORMAL LOW (ref >59)
Globulin, Total: 1.7 g/dL (ref 1.5–4.5)
Glucose: 107 mg/dL — ABNORMAL HIGH (ref 65–99)
Potassium: 4.2 mmol/L (ref 3.5–5.2)
Sodium: 137 mmol/L (ref 134–144)
Total Protein: 6.2 g/dL (ref 6.0–8.5)

## 2018-08-30 LAB — T3: T3, Total: 89 ng/dL (ref 71–180)

## 2018-08-30 LAB — CBC
Hematocrit: 18.1 % — ABNORMAL LOW (ref 34.0–46.6)
Hemoglobin: 6.6 g/dL — CL (ref 11.1–15.9)
MCH: 41.8 pg — ABNORMAL HIGH (ref 26.6–33.0)
MCHC: 36.5 g/dL — ABNORMAL HIGH (ref 31.5–35.7)
MCV: 115 fL — ABNORMAL HIGH (ref 79–97)
Platelets: 96 10*3/uL — CL (ref 150–450)
RBC: 1.58 x10E6/uL — CL (ref 3.77–5.28)
RDW: 15.3 % (ref 11.7–15.4)
WBC: 2.5 10*3/uL — CL (ref 3.4–10.8)

## 2018-08-30 LAB — T4, FREE: Free T4: 1.09 ng/dL (ref 0.82–1.77)

## 2018-08-30 LAB — VITAMIN B12: Vitamin B-12: <50 pg/mL — ABNORMAL LOW (ref 232–1245)

## 2018-08-30 LAB — METHYLMALONIC ACID, SERUM: Methylmalonic Acid: 14055 nmol/L (ref 0–378)

## 2018-08-30 LAB — TSH: TSH: 4.42 u[IU]/mL (ref 0.450–4.500)

## 2018-08-31 ENCOUNTER — Telehealth: Payer: Self-pay

## 2018-08-31 NOTE — Telephone Encounter (Signed)
[name] calls back.  Mom is refusing to go until the AM.  Text paged MD to inform and find out next steps.   Per [place], mom is not lightheaded or actively bleeding that she "knows of". Christen Bame, CMA

## 2018-08-31 NOTE — Telephone Encounter (Signed)
Patients daughter calling nurse line requesting recent lab results. Will send to provider who saw patient.

## 2018-08-31 NOTE — Telephone Encounter (Signed)
Called Patient's daughter, [name] with results. Recommended they go to the ED due to critical hbg of 6.6 with recommendation for transfusion. Daughter stated she would return call via clinic after speaking with her mother.   Stark Klein

## 2018-09-01 ENCOUNTER — Observation Stay (HOSPITAL_COMMUNITY): Payer: Medicare Other

## 2018-09-01 ENCOUNTER — Inpatient Hospital Stay (HOSPITAL_COMMUNITY)
Admission: EM | Admit: 2018-09-01 | Discharge: 2018-09-02 | DRG: 812 | Disposition: A | Discharge status: Home / Self Care | Payer: Medicare Other | Attending: Emergency Medicine | Admitting: Emergency Medicine

## 2018-09-01 ENCOUNTER — Other Ambulatory Visit: Payer: Self-pay

## 2018-09-01 ENCOUNTER — Emergency Department (HOSPITAL_COMMUNITY): Payer: Medicare Other

## 2018-09-01 DIAGNOSIS — D61818 Other pancytopenia: Secondary | ICD-10-CM | POA: Diagnosis not present

## 2018-09-01 DIAGNOSIS — Z20828 Contact with and (suspected) exposure to other viral communicable diseases: Secondary | ICD-10-CM | POA: Diagnosis present

## 2018-09-01 DIAGNOSIS — G309 Alzheimer's disease, unspecified: Secondary | ICD-10-CM | POA: Diagnosis present

## 2018-09-01 DIAGNOSIS — Z8673 Personal history of transient ischemic attack (TIA), and cerebral infarction without residual deficits: Secondary | ICD-10-CM

## 2018-09-01 DIAGNOSIS — F0150 Vascular dementia without behavioral disturbance: Secondary | ICD-10-CM | POA: Diagnosis not present

## 2018-09-01 DIAGNOSIS — K8020 Calculus of gallbladder without cholecystitis without obstruction: Secondary | ICD-10-CM | POA: Diagnosis not present

## 2018-09-01 DIAGNOSIS — F17210 Nicotine dependence, cigarettes, uncomplicated: Secondary | ICD-10-CM | POA: Diagnosis present

## 2018-09-01 DIAGNOSIS — R17 Unspecified jaundice: Secondary | ICD-10-CM | POA: Diagnosis not present

## 2018-09-01 DIAGNOSIS — Z8614 Personal history of Methicillin resistant Staphylococcus aureus infection: Secondary | ICD-10-CM

## 2018-09-01 DIAGNOSIS — Z9119 Patient's noncompliance with other medical treatment and regimen: Secondary | ICD-10-CM

## 2018-09-01 DIAGNOSIS — E782 Mixed hyperlipidemia: Secondary | ICD-10-CM | POA: Diagnosis not present

## 2018-09-01 DIAGNOSIS — E785 Hyperlipidemia, unspecified: Secondary | ICD-10-CM | POA: Diagnosis present

## 2018-09-01 DIAGNOSIS — D649 Anemia, unspecified: Secondary | ICD-10-CM

## 2018-09-01 DIAGNOSIS — D539 Nutritional anemia, unspecified: Secondary | ICD-10-CM | POA: Diagnosis not present

## 2018-09-01 DIAGNOSIS — J90 Pleural effusion, not elsewhere classified: Secondary | ICD-10-CM | POA: Diagnosis not present

## 2018-09-01 DIAGNOSIS — G301 Alzheimer's disease with late onset: Secondary | ICD-10-CM | POA: Diagnosis not present

## 2018-09-01 DIAGNOSIS — Z90710 Acquired absence of both cervix and uterus: Secondary | ICD-10-CM | POA: Diagnosis not present

## 2018-09-01 DIAGNOSIS — E559 Vitamin D deficiency, unspecified: Secondary | ICD-10-CM | POA: Diagnosis not present

## 2018-09-01 DIAGNOSIS — F0280 Dementia in other diseases classified elsewhere without behavioral disturbance: Secondary | ICD-10-CM | POA: Diagnosis not present

## 2018-09-01 DIAGNOSIS — Z79899 Other long term (current) drug therapy: Secondary | ICD-10-CM | POA: Diagnosis not present

## 2018-09-01 DIAGNOSIS — Z882 Allergy status to sulfonamides status: Secondary | ICD-10-CM

## 2018-09-01 DIAGNOSIS — G629 Polyneuropathy, unspecified: Secondary | ICD-10-CM | POA: Diagnosis present

## 2018-09-01 DIAGNOSIS — D510 Vitamin B12 deficiency anemia due to intrinsic factor deficiency: Secondary | ICD-10-CM | POA: Diagnosis not present

## 2018-09-01 LAB — PREPARE RBC (CROSSMATCH)

## 2018-09-01 LAB — SAVE SMEAR(SSMR), FOR PROVIDER SLIDE REVIEW

## 2018-09-01 LAB — COMPREHENSIVE METABOLIC PANEL
ALT: 12 U/L (ref 0–44)
AST: 18 U/L (ref 15–41)
Albumin: 4 g/dL (ref 3.5–5.0)
Alkaline Phosphatase: 49 U/L (ref 38–126)
Anion gap: 9 (ref 5–15)
BUN: 20 mg/dL (ref 8–23)
CO2: 23 mmol/L (ref 22–32)
Calcium: 8.9 mg/dL (ref 8.9–10.3)
Chloride: 106 mmol/L (ref 98–111)
Creatinine, Ser: 0.94 mg/dL (ref 0.44–1.00)
GFR calc Af Amer: >60 mL/min (ref >60)
GFR calc non Af Amer: 54 mL/min — ABNORMAL LOW (ref >60)
Glucose, Bld: 146 mg/dL — ABNORMAL HIGH (ref 70–99)
Potassium: 4 mmol/L (ref 3.5–5.1)
Sodium: 138 mmol/L (ref 135–145)
Total Bilirubin: 1.7 mg/dL — ABNORMAL HIGH (ref 0.3–1.2)
Total Protein: 6.1 g/dL — ABNORMAL LOW (ref 6.5–8.1)

## 2018-09-01 LAB — IRON AND TIBC
Iron: 121 ug/dL (ref 28–170)
Saturation Ratios: 52 % — ABNORMAL HIGH (ref 10.4–31.8)
TIBC: 234 ug/dL — ABNORMAL LOW (ref 250–450)
UIBC: 113 ug/dL

## 2018-09-01 LAB — CBC
HCT: 18.6 % — ABNORMAL LOW (ref 36.0–46.0)
Hemoglobin: 6.4 g/dL — CL (ref 12.0–15.0)
MCH: 43 pg — ABNORMAL HIGH (ref 26.0–34.0)
MCHC: 34.4 g/dL (ref 30.0–36.0)
MCV: 124.8 fL — ABNORMAL HIGH (ref 80.0–100.0)
Platelets: 110 10*3/uL — ABNORMAL LOW (ref 150–400)
RBC: 1.49 MIL/uL — ABNORMAL LOW (ref 3.87–5.11)
RDW: 14.1 % (ref 11.5–15.5)
WBC: 2.6 10*3/uL — ABNORMAL LOW (ref 4.0–10.5)
nRBC: 0 % (ref 0.0–0.2)

## 2018-09-01 LAB — FERRITIN: Ferritin: 253 ng/mL (ref 11–307)

## 2018-09-01 LAB — RETICULOCYTES
Immature Retic Fract: 12.7 % (ref 2.3–15.9)
RBC.: 1.72 MIL/uL — ABNORMAL LOW (ref 3.87–5.11)
Retic Count, Absolute: 35.6 10*3/uL (ref 19.0–186.0)
Retic Ct Pct: 2 % (ref 0.4–3.1)

## 2018-09-01 LAB — PROTIME-INR
INR: 1.2 (ref 0.8–1.2)
Prothrombin Time: 15.1 seconds (ref 11.4–15.2)

## 2018-09-01 LAB — ABO/RH: ABO/RH(D): A POS

## 2018-09-01 LAB — POC OCCULT BLOOD, ED: Fecal Occult Bld: NEGATIVE

## 2018-09-01 LAB — FOLATE: Folate: 14.8 ng/mL (ref >5.9)

## 2018-09-01 LAB — TSH: TSH: 3.071 u[IU]/mL (ref 0.350–4.500)

## 2018-09-01 LAB — HEMOGLOBIN AND HEMATOCRIT, BLOOD
HCT: 20.7 % — ABNORMAL LOW (ref 36.0–46.0)
Hemoglobin: 7.2 g/dL — ABNORMAL LOW (ref 12.0–15.0)

## 2018-09-01 LAB — SARS CORONAVIRUS 2 (TAT 6-24 HRS): SARS Coronavirus 2: NEGATIVE

## 2018-09-01 MED ORDER — SODIUM CHLORIDE 0.9% IV SOLUTION: Freq: Once | INTRAVENOUS | Status: DC

## 2018-09-01 MED ORDER — CYANOCOBALAMIN 1000 MCG/ML IJ SOLN
1000.0000 ug | Freq: Once | INTRAMUSCULAR | Status: DC
  Filled 2018-09-01: qty 1

## 2018-09-01 MED ORDER — SODIUM CHLORIDE 0.9 % IV SOLN: 10.0000 mL/h | Freq: Once | INTRAVENOUS | Status: DC

## 2018-09-01 NOTE — Plan of Care (Signed)

## 2018-09-01 NOTE — H&P (Addendum)
Family Medicine Teaching [facility] Admission History and Physical Service Pager: [phone]  Patient name: Joann FickOlive Seres Medical record number: [id number] Date of birth: 11/27/1927 Age: 85 y.o. Gender: female  Primary Care Provider: Lavonda JumboAutry-Lott, Simone, DO Consultants: none Code Status: full code (confirmed during admission) Preferred Emergency Contact: Mitzi DavenportShelby (daugther)  Chief Complaint: low hemoglobin  Assessment and Plan: Joann [name] is a 85 y.o. female presenting with anemia with hbg of 6.4. PMH is significant for vitamin B12 deficiency, alzheimer's and vascular dementia, Vitamin D deficiency, HLD, tobacco dependence and osteopenia.   Macrocytic Anemia  Patient with hemoglobin of 6.4 on admission and MCV of 124. Patient's history significant for vitamin b12 deficiency with vitamin b12 level <50 with MMA elevated at 14,000 during recent PCP visit on 08/26/2018. Patient's daughter denies that her mother takes any medications. Concern that patient may not remember to take medications given dementia, of note patient has also not been seen since 2017 for outpatient visit. TSH WNL, fecal occult blood test is negative. Most concerning on differential is patient's reported history of intermittent dark stools which is concerning for GI bleed or colon cancer given patient has never had colonoscopy. Patient reports minimal sx, but daughter reports gait abnormality. Patient's pancytopenia and history of tobacco use is concerning for other potential cancer sources such as pulmonary or bone marrow dysfunction. Chart review reveals long history of vitamin B12 deficiency and reported pernicious anemia as a likely etiology for anemia, patient has been reported to be non-adherent to vitamin supplementations for years. UA and urine culture obtained in ED per patient's daughter request given that she previously was tired with UTI, however patient denies sx, so no indication to treat at this time.  -Admit to  observation, med-surg, attending Dr. Manson PasseyBrown  -type and screen -transfusion 2u pRBC's  -post-transfusion H/H -Folate  -Ferritin, Iron studies  -blood smear with pathologist review -consider consulting GI for further investigation of possible GI bleed  - PT/OT eval and treat  Vitamin B12 Deficiency  Patient previously prescribed B12 supplements PO but has not taken in years. B12 levels <50 with elevated MMA at 14,000. Patient reports diet heavy on fast foods and snack foods.  -B12 injection while inpatient  -will recommend weekly followed by monthly B12 injections as outpatient  -consult nutrition   Elevated Tot Bili of 1.7 Patient without scleral icterus, no RUQ tenderness to palpation, and no transaminitis on CMP. Due to concern for potential malignancy contributing to anemia, will further investigate with imaging and hepatic panel.  -RUQ u/s -CMP in am  Dementia Patient dx with dementia in 2017 and family did not wish to pursue treatment with namenda. Does live alone but is able to perform ADL's per report.  - Delirium precautions if patient has prolonged stay - monitor mentation  H/o prior lacunar stroke, 2017 No deficits noted on exam.     FEN/GI: Regular diet Prophylaxis: SCDs  Disposition: place in observation pending transfusions and anemia work up  History of Present Illness:  Joann FickOlive Laroque is a 85 y.o. female presenting with increased fatigue for a few weeks. Patient's daughter was concerned because she notices that when she visits her mother lies in the bed more than usual. [facility] and had CBC and vitamin B12 levels drawn. Results showed hemoglobin on 6.6, today 6.4. Vitamin B12 prescribed for patient but she has denies taking and was last seen by primary care provider in 2017. Patient's B12 levels measured <50 on lab work from  clinic visit. Patient denies any history of falls or trauma. Patient reports that she is having some occasional dark  stools but denies that the stools are black in color. She does not notice any blood in the toilet or scant blood on tissue after wiping. Patient noted to have some dementia (per outpatient visit patient recalls 0/3 items after 5 mins and unable to appropriately draw clock) however daughter reports that patient is historically reliable with informing family members when she is in pain or discomfort. Patient denies SOB, dyspnea, palpitations, dysuria, fever, chills, weight loss and decreased appetite. Patient's daughter reports that she noticed while walking to the car this morning, it appeared that her mother had to take her time to steady herself halfway through the walk to the car.  ED Course:  CBC:   WBC 2.6L  HBG 6.4  MCV 124.8  PLTs 110  Fecal Occult Bld: Neg   CMP : Tot Protein 6.1   Gluc 146   Tot Bili: 1.7   Type and screen: A +  INR: 1.2, PT 15.1   TSH: 3.071    Anemia: Hbg 6.4 on admission in setting of   Review Of Systems: Per HPI with the following additions:   Review of Systems  Constitutional: Negative for fever and weight loss.  HENT: Negative for sore throat.   Respiratory: Negative for cough and shortness of breath.   Cardiovascular: Negative for chest pain and palpitations.  Gastrointestinal: Positive for melena. Negative for abdominal pain, nausea and vomiting.  Genitourinary: Positive for frequency. Negative for dysuria.  Musculoskeletal: Negative for falls.  Neurological: Positive for dizziness. Negative for loss of consciousness and headaches.    Patient Active Problem List   Diagnosis Date Noted  . Anemia 09/01/2018  . Increased sleeping 08/26/2018  . Healthcare maintenance 09/06/2016  . Swollen ankles 11/09/2015  . Vitamin B12 deficiency 04/28/2015  . Benign Ethnic Leukopenia 04/28/2015  . Stroke, lacunar (HCC) 04/28/2015  . Mixed Alzheimer's and vascular dementia (HCC) 03/23/2015  . Memory loss of unknown cause 10/05/2014  . Vaginal itching  03/20/2012  . Vitamin D deficiency 06/13/2011  . KNEE PAIN, LEFT, CHRONIC 02/02/2009  . ANEMIA, PERNICIOUS 09/21/2007  . HYPERLIPIDEMIA 03/27/2006  . TOBACCO DEPENDENCE 03/27/2006  . NEUROPATHY, PERIPHERAL 03/27/2006  . OSTEOPENIA 03/27/2006    Past Medical History: Past Medical History:  Diagnosis Date  . ANEMIA, PERNICIOUS 09/21/2007   Qualifier: Diagnosis of  By: Humberto SealsSaxon NP, Darl PikesSusan    . Benign Ethnic Leukopenia 04/28/2015  . Dizziness 07/10/2014  . Hip osteoarthritis 09/24/2010  . History of anemia 10/05/2014  . Hypercholesteremia   . HYPERLIPIDEMIA 03/27/2006   Qualifier: Diagnosis of  By: Levada SchillingWATT, JOANNE    . KNEE PAIN, LEFT, CHRONIC 02/02/2009   Qualifier: Diagnosis of  By: Clotilde DieterStrother MD, Amber    . Leukopenia 03/23/2015  . Mixed Alzheimer's and vascular dementia (HCC) 03/23/2015  . MRSA (methicillin resistant staph aureus) culture positive 06/21/2011  . NEUROPATHY, PERIPHERAL 03/27/2006   Qualifier: Diagnosis of  By: Levada SchillingWATT, JOANNE    . OSTEOPENIA 03/27/2006  . Stroke, lacunar (HCC) 04/28/2015  . UTI (urinary tract infection) 03/23/2015  . Vitamin B12 deficiency 04/28/2015  . Vitamin D deficiency 06/13/2011    Past Surgical History: Past Surgical History:  Procedure Laterality Date  . ABDOMINAL HYSTERECTOMY    . INCISION AND DRAINAGE PERIRECTAL ABSCESS  06/16/2011   Procedure: IRRIGATION AND DEBRIDEMENT PERIRECTAL ABSCESS;  Surgeon: Robyne AskewPaul S Toth III, MD;  Location: [facility] OR;  Service: General;  Laterality: N/A;   Social History: Social History   Tobacco Use  . Smoking status: Current Every Day Smoker    Packs/day: 0.50    Types: Cigarettes    Last attempt to quit: 06/15/2011    Years since quitting: 7.2  . Smokeless tobacco: Never Used  Substance Use Topics  . Alcohol use: No  . Drug use: No    Please also refer to relevant sections of EMR.  Family History: Family History  Problem Relation Age of Onset  . Kidney disease Mother   . Cancer Sister        colon  . Cancer Brother         lung  . Cancer Sister        stomach  . Dementia Sister   . Dementia Sister   . Dementia Sister   . Dementia Brother    Allergies and Medications: Allergies  Allergen Reactions  . Sulfonamide Derivatives Other (See Comments)    Exact reaction not recalled- patient remarked it made her "not feel well"   No current facility-administered medications on file prior to encounter.    No current outpatient medications on file prior to encounter.    Objective: BP 120/65   Pulse (!) 59   Temp 98.7 F (37.1 C) (Oral)   Resp 12   SpO2 100%   Exam: General: thin, elderly female in NAD, sitting up in bed, pleasantly conversational  Eyes: no icteric sclerae, EOMI, conjunctival pallor  ENTM: no oropharyngeal erythema, no petechiae, MMM Neck: no cervicle or clavicular LAD appreciated  Cardiovascular: RRR without murmurs, gallops or rubs, cap refill =3 secs  Respiratory: CTAB without wheezing or cough, diminished breath sounds, normal WOB  Gastrointestinal: soft, NT, ND, +BS throughout MSK: extremities without edema  Derm: dry skin in upper and lower extremities  Neuro: alert, oriented to person, place and situation   Labs and Imaging: CBC BMET  Recent Labs  Lab 09/01/18 1102  WBC 2.6*  HGB 6.4*  HCT 18.6*  PLT 110*   Recent Labs  Lab 09/01/18 1102  NA 138  K 4.0  CL 106  CO2 23  BUN 20  CREATININE 0.94  GLUCOSE 146*  CALCIUM 8.9     CXR IMPRESSION: Mild linear atelectasis or scarring at the left lung base. No focal consolidation within the lungs.   Stark Klein, MD 09/01/2018, 3:48 PM PGY-1, [place] Intern pager: [phone], text pages welcome  FPTS Upper-Level Resident Addendum   I have independently interviewed and examined the patient. I have discussed the above with the original author and agree with their documentation. My edits for correction/addition/clarification are in purple. Please see also any attending notes.     [place] Lizza Huffaker, DO PGY-3, [facility] 09/01/2018 5:44 PM  [place] Service pager: [phone] (text pages welcome through [facility])

## 2018-09-01 NOTE — ED Notes (Signed)
ED TO INPATIENT HANDOFF REPORT  ED Nurse Name and Phone #: mike rn  S Name/Age/Gender Joann [name] 85 y.o. female Room/Bed: 025C/025C  Code Status   Code Status: Full Code  Home/SNF/Other Home Patient oriented to: self, place, time and situation Is this baseline? Yes   Triage Complete: Triage complete  Chief Complaint Needs blood transfusion  Triage Note Pt arrives here with family after having a low hemoglobin lab done by pcp per family it was 6. Family had taken her her to see doctor due to being more tired and being in the bed a lot.    Allergies Allergies  Allergen Reactions  . Sulfonamide Derivatives Other (See Comments)    Exact reaction not recalled- patient remarked it made her "not feel well"    Level of Care/Admitting Diagnosis ED Disposition    ED Disposition Condition [place]: [place] [100100]  Level of Care: Med-Surg [16]  Covid Evaluation: Asymptomatic Screening Protocol (No Symptoms)  Diagnosis: Anemia [245809]  Admitting Physician: SHIRLEY, [place] [[id number]]  Attending Physician: Martyn Malay [[id number]]  PT Class (Do Not Modify): Observation [104]  PT Acc Code (Do Not Modify): Observation [10022]       B Medical/Surgery History Past Medical History:  Diagnosis Date  . ANEMIA, PERNICIOUS 09/21/2007   Qualifier: Diagnosis of  By: Zebedee Iba NP, Manuela Schwartz    . Benign Ethnic Leukopenia 04/28/2015  . Dizziness 07/10/2014  . Hip osteoarthritis 09/24/2010  . History of anemia 10/05/2014  . Hypercholesteremia   . HYPERLIPIDEMIA 03/27/2006   Qualifier: Diagnosis of  By: Benna Dunks    . KNEE PAIN, LEFT, CHRONIC 02/02/2009   Qualifier: Diagnosis of  By: Annamary Carolin MD, Amber    . Leukopenia 03/23/2015  . Mixed Alzheimer's and vascular dementia ([place]) 03/23/2015  . MRSA (methicillin resistant staph aureus) culture positive 06/21/2011  . NEUROPATHY, PERIPHERAL 03/27/2006   Qualifier: Diagnosis of  By: Benna Dunks    .  OSTEOPENIA 03/27/2006  . Stroke, lacunar ([place]) 04/28/2015  . UTI (urinary tract infection) 03/23/2015  . Vitamin B12 deficiency 04/28/2015  . Vitamin D deficiency 06/13/2011   Past Surgical History:  Procedure Laterality Date  . ABDOMINAL HYSTERECTOMY    . INCISION AND DRAINAGE PERIRECTAL ABSCESS  06/16/2011   Procedure: IRRIGATION AND DEBRIDEMENT PERIRECTAL ABSCESS;  Surgeon: Merrie Roof, MD;  Location: [facility];  Service: General;  Laterality: N/A;     A IV Location/Drains/Wounds Patient Lines/Drains/Airways Status   Active Line/Drains/Airways    Name:   Placement date:   Placement time:   Site:   Days:   Peripheral IV 09/01/18 Right Forearm   09/01/18    1524    Forearm   less than 1   Urethral Catheter Latex   06/16/11    1840    Latex   2634   Incision 06/16/11 Perineum Right   06/16/11    1908     2634          Intake/Output Last 24 hours  Intake/Output Summary (Last 24 hours) at 09/01/2018 1616 Last data filed at 09/01/2018 1527 Gross per 24 hour  Intake 278 ml  Output -  Net 278 ml    Labs/Imaging Results for orders placed or performed during the hospital encounter of 09/01/18 (from the past 48 hour(s))  Comprehensive metabolic panel     Status: Abnormal   Collection Time: 09/01/18 11:02 AM  Result Value Ref Range   Sodium 138 135 -  145 mmol/L   Potassium 4.0 3.5 - 5.1 mmol/L   Chloride 106 98 - 111 mmol/L   CO2 23 22 - 32 mmol/L   Glucose, Bld 146 (H) 70 - 99 mg/dL   BUN 20 8 - 23 mg/dL   Creatinine, Ser 2.840.94 0.44 - 1.00 mg/dL   Calcium 8.9 8.9 - 13.210.3 mg/dL   Total Protein 6.1 (L) 6.5 - 8.1 g/dL   Albumin 4.0 3.5 - 5.0 g/dL   AST 18 15 - 41 U/L   ALT 12 0 - 44 U/L   Alkaline Phosphatase 49 38 - 126 U/L   Total Bilirubin 1.7 (H) 0.3 - 1.2 mg/dL   GFR calc non Af Amer 54 (L) >60 mL/min   GFR calc Af Amer >60 >60 mL/min   Anion gap 9 5 - 15    Comment: Performed at [facility], [address] [place], [place] [address]  CBC     Status: Abnormal    Collection Time: 09/01/18 11:02 AM  Result Value Ref Range   WBC 2.6 (L) 4.0 - 10.5 K/uL   RBC 1.49 (L) 3.87 - 5.11 MIL/uL   Hemoglobin 6.4 (LL) 12.0 - 15.0 g/dL    Comment: REPEATED TO VERIFY THIS CRITICAL RESULT HAS VERIFIED AND BEEN CALLED TO GREG NIKOLICH,RN BY ZELDA BEECH ON 08 04 2020 AT 1150, AND HAS BEEN READ BACK.     HCT 18.6 (L) 36.0 - 46.0 %   MCV 124.8 (H) 80.0 - 100.0 fL   MCH 43.0 (H) 26.0 - 34.0 pg   MCHC 34.4 30.0 - 36.0 g/dL   RDW 27.214.1 53.611.5 - 64.415.5 %   Platelets 110 (L) 150 - 400 K/uL    Comment: REPEATED TO VERIFY PLATELET COUNT CONFIRMED BY SMEAR Immature Platelet Fraction may be clinically indicated, consider ordering this additional test [id number]    nRBC 0.0 0.0 - 0.2 %    Comment: Performed at [facility], [address] [place], [place] [address]  Type and screen [facility]     Status: None (Preliminary result)   Collection Time: 09/01/18 11:12 AM  Result Value Ref Range   ABO/RH(D) A POS    Antibody Screen NEG    Sample Expiration 09/04/2018,2359    Unit Number [id number]    Blood Component Type RBC LR PHER1    Unit division 00    Status of Unit ISSUED    Transfusion Status OK TO TRANSFUSE    Crossmatch Result      Compatible Performed at [facility], [address] [place], [place] [address]   ABO/Rh     Status: None   Collection Time: 09/01/18 11:12 AM  Result Value Ref Range   ABO/RH(D)      A POS Performed at [facility], [address] [place], [place] [address]   Protime-INR     Status: None   Collection Time: 09/01/18 12:27 PM  Result Value Ref Range   Prothrombin Time 15.1 11.4 - 15.2 seconds   INR 1.2 0.8 - 1.2    Comment: (NOTE) INR goal varies based on device and disease states. Performed at [facility], [address] [place], [place] [address]   Prepare RBC     Status: None   Collection Time: 09/01/18 12:27 PM  Result Value Ref Range   Order Confirmation      ORDER  PROCESSED BY BLOOD BANK Performed at [facility],  [address] [place], [place] [address]   TSH     Status: None   Collection Time: 09/01/18 12:27 PM  Result Value Ref Range   TSH 3.071 0.350 - 4.500 uIU/mL    Comment: Performed by a 3rd Generation assay with a functional sensitivity of <=0.01 uIU/mL. Performed at [facility], [address] [place], [place] [address]   POC occult blood, ED     Status: None   Collection Time: 09/01/18  1:01 PM  Result Value Ref Range   Fecal Occult Bld NEGATIVE NEGATIVE   Dg Chest Portable 1 View  Result Date: 09/01/2018 CLINICAL DATA:  Fatigue EXAM: PORTABLE CHEST 1 VIEW COMPARISON:  Chest radiograph 08/17/2013 FINDINGS: The heart is not enlarged. Atherosclerotic thoracic aorta. Mild linear atelectasis or scarring at the left lung base. No focal consolidation within the lungs. No evidence of pleural effusion or pneumothorax. No acute bony abnormality. IMPRESSION: Mild linear atelectasis or scarring at the left lung base. No focal consolidation within the lungs. Electronically Signed   By: Jackey LogeKyle  Golden   On: 09/01/2018 12:50    Pending Labs Unresulted Labs (From admission, onward)    Start     Ordered   09/02/18 0500  Hepatic function panel  Tomorrow morning,   R     09/01/18 1614   09/01/18 1610  Folate  Add-on,   AD     09/01/18 1609   09/01/18 1610  Ferritin  Add-on,   AD     09/01/18 1609   09/01/18 1610  Iron and TIBC  Add-on,   AD     09/01/18 1609   09/01/18 1549  Pathologist smear review  Once,   STAT     09/01/18 1548   09/01/18 1228  SARS CORONAVIRUS 2 Nasal Swab Aptima Multi Swab  (Asymptomatic/Tier 2 Patients Labs)  Once,   STAT    Question Answer Comment  Is this test for diagnosis or screening Screening   Symptomatic for COVID-19 as defined by CDC No   Hospitalized for COVID-19 No   Admitted to ICU for COVID-19 No   Previously tested for COVID-19 No   Resident in a congregate (group) care setting Unknown    Employed in healthcare setting Unknown   Pregnant No      09/01/18 1227   09/01/18 1227  Urinalysis, Routine w reflex microscopic  Once,   STAT     09/01/18 1227   09/01/18 1227  Urine culture  ONCE - STAT,   STAT     09/01/18 1227   Signed and Held  Basic metabolic panel  Tomorrow morning,   R     Signed and Held   Signed and Held  CBC  Tomorrow morning,   R     Signed and Held   Signed and Held  Save Smear  Once,   R     Signed and Held          Vitals/Pain Today's Vitals   09/01/18 1101 09/01/18 1526 09/01/18 1527 09/01/18 1542  BP:  118/63 (!) 129/59 120/65  Pulse:  65 65 (!) 59  Resp:  18 16 12   Temp:  98.1 F (36.7 C) 98.1 F (36.7 C) 98.7 F (37.1 C)  TempSrc:  Oral Oral Oral  SpO2:  100%  100%  PainSc: 0-No pain       Isolation Precautions No active isolations  Medications Medications  0.9 %  sodium chloride infusion (has no administration in time  range)    Mobility walks with device     Focused Assessments Cardiac Assessment Handoff:    Lab Results  Component Value Date   CKTOTAL 425 (H) 06/15/2011   CKMB 3.5 06/15/2011   TROPONINI <0.30 06/15/2011   No results found for: DDIMER Does the Patient currently have chest pain? No     R Recommendations: See Admitting Provider Note  Report given to:   Additional Notes: none

## 2018-09-01 NOTE — ED Notes (Signed)
Lab called critical value hemoglobin 6.4 notified Dr Laverta Baltimore

## 2018-09-01 NOTE — Progress Notes (Signed)
1754: Patient arrived to room [phone]. Walked from stretcher to bed. States her daughter cooks and cleans for her, otherwise independent.  1847: Call back to daughter [place]. Updated on condition.

## 2018-09-01 NOTE — ED Provider Notes (Signed)
[facility] EMERGENCY DEPARTMENT Provider Note   CSN: [id number] Arrival date & time: 09/01/18  1035     History   Chief Complaint Chief Complaint  Patient presents with  . Abnormal Lab    HPI Joann [name] is a 85 y.o. female.     The history is provided by the patient, a relative and medical records. No language interpreter was used.  Illness Location:  Generalized fatigue Quality:  Gradual Severity:  Moderate Onset quality:  Gradual Duration:  3 weeks Timing:  Constant Progression:  Worsening Chronicity:  New Associated symptoms: fatigue   Associated symptoms: no abdominal pain, no chest pain, no congestion, no cough, no diarrhea, no fever, no headaches, no loss of consciousness, no nausea, no rash, no shortness of breath, no sore throat, no vomiting and no wheezing   Risk factors:  Dark stools   Past Medical History:  Diagnosis Date  . ANEMIA, PERNICIOUS 09/21/2007   Qualifier: Diagnosis of  By: Humberto SealsSaxon NP, Darl PikesSusan    . Benign Ethnic Leukopenia 04/28/2015  . Dizziness 07/10/2014  . Hip osteoarthritis 09/24/2010  . History of anemia 10/05/2014  . Hypercholesteremia   . HYPERLIPIDEMIA 03/27/2006   Qualifier: Diagnosis of  By: Levada SchillingWATT, JOANNE    . KNEE PAIN, LEFT, CHRONIC 02/02/2009   Qualifier: Diagnosis of  By: Clotilde DieterStrother MD, Amber    . Leukopenia 03/23/2015  . Mixed Alzheimer's and vascular dementia (H[facility]) 03/23/2015  . MRSA (methicillin resistant staph aureus) culture positive 06/21/2011  . NEUROPATHY, PERIPHERAL 03/27/2006   Qualifier: Diagnosis of  By: Levada SchillingWATT, JOANNE    . OSTEOPENIA 03/27/2006  . Stroke, lacunar (HCC) 04/28/2015  . UTI (urinary tract infection) 03/23/2015  . Vitamin B12 deficiency 04/28/2015  . Vitamin D deficiency 06/13/2011    Patient Active Problem List   Diagnosis Date Noted  . Increased sleeping 08/26/2018  . Healthcare maintenance 09/06/2016  . Swollen ankles 11/09/2015  . Vitamin B12 deficiency 04/28/2015  . Benign Ethnic Leukopenia  04/28/2015  . Stroke, lacunar (HCC) 04/28/2015  . Mixed Alzheimer's and vascular dementia (HCC) 03/23/2015  . Memory loss of unknown cause 10/05/2014  . Vaginal itching 03/20/2012  . Vitamin D deficiency 06/13/2011  . KNEE PAIN, LEFT, CHRONIC 02/02/2009  . ANEMIA, PERNICIOUS 09/21/2007  . HYPERLIPIDEMIA 03/27/2006  . TOBACCO DEPENDENCE 03/27/2006  . NEUROPATHY, PERIPHERAL 03/27/2006  . OSTEOPENIA 03/27/2006    Past Surgical History:  Procedure Laterality Date  . ABDOMINAL HYSTERECTOMY    . INCISION AND DRAINAGE PERIRECTAL ABSCESS  06/16/2011   Procedure: IRRIGATION AND DEBRIDEMENT PERIRECTAL ABSCESS;  Surgeon: Robyne AskewPaul S Toth III, MD;  Location: [facility] OR;  Service: General;  Laterality: N/A;     OB History   No obstetric history on file.      Home Medications    Prior to Admission medications   Medication Sig Start Date End Date Taking? Authorizing Provider  acetaminophen (TYLENOL) 500 MG tablet Take 1,000 mg by mouth 2 (two) times daily as needed for mild pain. Thursday    [provider]  Cholecalciferol 1000 units capsule Take 1 capsule (1,000 Units total) by mouth daily. 11/06/15   Casey BurkittFitzgerald, Hillary Moen, MD  Cyanocobalamin (B-12) 1000 MCG TBCR Take 1 tablet by mouth daily. 11/06/15   Casey BurkittFitzgerald, Hillary Moen, MD    Family History Family History  Problem Relation Age of Onset  . Kidney disease Mother   . Cancer Sister        colon  . Cancer Brother  lung  . Cancer Sister        stomach  . Dementia Sister   . Dementia Sister   . Dementia Sister   . Dementia Brother     Social History Social History   Tobacco Use  . Smoking status: Current Every Day Smoker    Packs/day: 0.50    Types: Cigarettes    Last attempt to quit: 06/15/2011    Years since quitting: 7.2  . Smokeless tobacco: Never Used  Substance Use Topics  . Alcohol use: No  . Drug use: No     Allergies   Sulfonamide derivatives   Review of Systems Review of Systems   Constitutional: Positive for fatigue. Negative for chills, diaphoresis and fever.  HENT: Negative for congestion and sore throat.   Respiratory: Negative for cough, choking, chest tightness, shortness of breath and wheezing.   Cardiovascular: Negative for chest pain, palpitations and leg swelling.  Gastrointestinal: Negative for abdominal pain, constipation, diarrhea, nausea and vomiting.       Dark stools  Genitourinary: Negative for dysuria.  Musculoskeletal: Negative for back pain, neck pain and neck stiffness.  Skin: Negative for rash and wound.  Neurological: Negative for dizziness, loss of consciousness, syncope, weakness, light-headedness, numbness and headaches.  Psychiatric/Behavioral: Negative for agitation.  All other systems reviewed and are negative.    Physical Exam Updated Vital Signs BP (!) 114/50 (BP Location: Left Arm)   Pulse 61   Temp 98.7 F (37.1 C) (Oral)   Resp 16   SpO2 100%   Physical Exam Vitals signs and nursing note reviewed.  Constitutional:      General: She is not in acute distress.    Appearance: She is well-developed. She is not ill-appearing, toxic-appearing or diaphoretic.  HENT:     Head: Normocephalic and atraumatic.     Nose: No congestion or rhinorrhea.     Mouth/Throat:     Pharynx: No oropharyngeal exudate or posterior oropharyngeal erythema.  Eyes:     Conjunctiva/sclera: Conjunctivae normal.     Pupils: Pupils are equal, round, and reactive to light.  Neck:     Musculoskeletal: Neck supple. No muscular tenderness.  Cardiovascular:     Rate and Rhythm: Normal rate and regular rhythm.     Pulses: Normal pulses.     Heart sounds: No murmur.  Pulmonary:     Effort: Pulmonary effort is normal. No respiratory distress.     Breath sounds: Normal breath sounds. No wheezing, rhonchi or rales.  Chest:     Chest wall: No tenderness.  Abdominal:     General: Abdomen is flat.     Palpations: Abdomen is soft.     Tenderness: There is  no abdominal tenderness. There is no right CVA tenderness, left CVA tenderness or rebound.  Genitourinary:    Rectum: Guaiac result negative.     Comments:   Musculoskeletal:        General: No tenderness.     Right lower leg: No edema.     Left lower leg: No edema.  Skin:    General: Skin is warm and dry.     Capillary Refill: Capillary refill takes less than 2 seconds.     Findings: No erythema.  Neurological:     General: No focal deficit present.     Mental Status: She is alert and oriented to person, place, and time.     Sensory: No sensory deficit.     Motor: No weakness.  Psychiatric:  Mood and Affect: Mood normal.      ED Treatments / Results  Labs (all labs ordered are listed, but only abnormal results are displayed) Labs Reviewed  COMPREHENSIVE METABOLIC PANEL - Abnormal; Notable for the following components:      Result Value   Glucose, Bld 146 (*)    Total Protein 6.1 (*)    Total Bilirubin 1.7 (*)    GFR calc non Af Amer 54 (*)    All other components within normal limits  CBC - Abnormal; Notable for the following components:   WBC 2.6 (*)    RBC 1.49 (*)    Hemoglobin 6.4 (*)    HCT 18.6 (*)    MCV 124.8 (*)    MCH 43.0 (*)    Platelets 110 (*)    All other components within normal limits  URINE CULTURE  SARS CORONAVIRUS 2  PROTIME-INR  TSH  URINALYSIS, ROUTINE W REFLEX MICROSCOPIC  PATHOLOGIST SMEAR REVIEW  POC OCCULT BLOOD, ED  TYPE AND SCREEN  ABO/RH  PREPARE RBC (CROSSMATCH)    EKG None  Radiology Dg Chest Portable 1 View  Result Date: 09/01/2018 CLINICAL DATA:  Fatigue EXAM: PORTABLE CHEST 1 VIEW COMPARISON:  Chest radiograph 08/17/2013 FINDINGS: The heart is not enlarged. Atherosclerotic thoracic aorta. Mild linear atelectasis or scarring at the left lung base. No focal consolidation within the lungs. No evidence of pleural effusion or pneumothorax. No acute bony abnormality. IMPRESSION: Mild linear atelectasis or scarring at the  left lung base. No focal consolidation within the lungs. Electronically Signed   By: Jackey LogeKyle  Golden   On: 09/01/2018 12:50    Procedures Procedures (including critical care time)  CRITICAL CARE Performed by: Canary Brimhristopher J Yazmyne Sara Total critical care time: 35 minutes Critical care time was exclusive of separately billable procedures and treating other patients. Critical care was necessary to treat or prevent imminent or life-threatening deterioration. Critical care was time spent personally by me on the following activities: development of treatment plan with patient and/or surrogate as well as nursing, discussions with consultants, evaluation of patient's response to treatment, examination of patient, obtaining history from patient or surrogate, ordering and performing treatments and interventions, ordering and review of laboratory studies, ordering and review of radiographic studies, pulse oximetry and re-evaluation of patient's condition.   Medications Ordered in ED Medications  0.9 %  sodium chloride infusion (has no administration in time range)     Initial Impression / Assessment and Plan / ED Course  I have reviewed the triage vital signs and the nursing notes.  Pertinent labs & imaging results that were available during my care of the patient were reviewed by me and considered in my medical decision making (see chart for details).        Joann FickOlive [name] is a 85 y.o. female with a past medical history significant for prior stroke, osteopenia, hyperlipidemia, mixed Alzheimer's/vascular dementia, hypercholesterolemia, and vitamin B deficiency who presents at the direction of her family medicine physician for further management and evaluation of anemia.  Patient reports that for the last few weeks she has had dark tarry stools at times.  She denies any history of GI bleed but has been very fatigued and tired.  The daughter is present and reports that she has had no energy.  She denies chest  pain, shortness of breath, syncope, or near syncopal episodes.  They report that she has no energy and does not want to get up and get around.  She denies any constipation,  diarrhea, or urinary symptoms.  She denies any cough, congestion, fevers, or chills.  She has not been around anybody with coronavirus to her knowledge.  Patient was seen by her PCP last week and had blood work performed showing patient's hemoglobin had dropped from the 12's into the 6 range.  Patient was told to come in for likely transfusion and admission.  On exam, patient's lungs are clear and chest is nontender.  Abdomen is nontender.  Fecal occult test will be performed with a chaperone.  Patient resting comfortably with no focal neurologic deficits.  Vital signs are reassuring on my initial evaluation.  Clinically I am concerned the patient is extremely fatigued due to symptomatic anemia from likely GI source with report of dark tarry stools.  We will check the fecal occult test and will order blood transfusion.  Patient's hemoglobin is confirmed to be in 6.4 and a slightly downtrending since last week.    Due to family concerns that patient has had fatigue in the setting of occult infection in the past, the requested evaluation for UTI and pneumonia.  Patient will have chest x-ray, coronavirus test due to likely admission, and urinalysis.  Anticipate admission for symptomatic anemia with likely GI source.  1:08 PM Fecal occult test is negative.  Unclear etiology of the patient's anemia however review now shows that she is actually pancytopenic.  We will call family medicine team for admission.   Final Clinical Impressions(s) / ED Diagnoses   Final diagnoses:  Symptomatic anemia  Pancytopenia [facility](HCC)    ED Discharge Orders    None      Clinical Impression: 1. Symptomatic anemia   2. Pancytopenia (HCC)     Disposition: Admit  This note was prepared with assistance of Dragon voice recognition software.  Occasional wrong-word or sound-a-like substitutions may have occurred due to the inherent limitations of voice recognition software.     Ferris Fielden, Canary Brimhristopher J, MD 09/01/18 [phone]

## 2018-09-01 NOTE — ED Triage Notes (Signed)
Pt arrives here with family after having a low hemoglobin lab done by pcp per family it was 27. Family had taken her her to see doctor due to being more tired and being in the bed a lot.

## 2018-09-02 DIAGNOSIS — E538 Deficiency of other specified B group vitamins: Secondary | ICD-10-CM

## 2018-09-02 DIAGNOSIS — D649 Anemia, unspecified: Secondary | ICD-10-CM | POA: Diagnosis not present

## 2018-09-02 DIAGNOSIS — Z8673 Personal history of transient ischemic attack (TIA), and cerebral infarction without residual deficits: Secondary | ICD-10-CM | POA: Diagnosis not present

## 2018-09-02 DIAGNOSIS — G309 Alzheimer's disease, unspecified: Secondary | ICD-10-CM | POA: Diagnosis not present

## 2018-09-02 DIAGNOSIS — Z8614 Personal history of Methicillin resistant Staphylococcus aureus infection: Secondary | ICD-10-CM | POA: Diagnosis not present

## 2018-09-02 DIAGNOSIS — E782 Mixed hyperlipidemia: Secondary | ICD-10-CM | POA: Diagnosis not present

## 2018-09-02 DIAGNOSIS — Z882 Allergy status to sulfonamides status: Secondary | ICD-10-CM | POA: Diagnosis not present

## 2018-09-02 DIAGNOSIS — E559 Vitamin D deficiency, unspecified: Secondary | ICD-10-CM | POA: Diagnosis not present

## 2018-09-02 DIAGNOSIS — D510 Vitamin B12 deficiency anemia due to intrinsic factor deficiency: Secondary | ICD-10-CM | POA: Diagnosis not present

## 2018-09-02 DIAGNOSIS — G629 Polyneuropathy, unspecified: Secondary | ICD-10-CM | POA: Diagnosis not present

## 2018-09-02 DIAGNOSIS — Z9119 Patient's noncompliance with other medical treatment and regimen: Secondary | ICD-10-CM | POA: Diagnosis not present

## 2018-09-02 DIAGNOSIS — E785 Hyperlipidemia, unspecified: Secondary | ICD-10-CM | POA: Diagnosis not present

## 2018-09-02 DIAGNOSIS — D61818 Other pancytopenia: Secondary | ICD-10-CM | POA: Diagnosis not present

## 2018-09-02 DIAGNOSIS — Z79899 Other long term (current) drug therapy: Secondary | ICD-10-CM | POA: Diagnosis not present

## 2018-09-02 DIAGNOSIS — R17 Unspecified jaundice: Secondary | ICD-10-CM | POA: Diagnosis not present

## 2018-09-02 LAB — MRSA PCR SCREENING: MRSA by PCR: NEGATIVE

## 2018-09-02 LAB — COMPREHENSIVE METABOLIC PANEL
ALT: 12 U/L (ref 0–44)
AST: 16 U/L (ref 15–41)
Albumin: 3.3 g/dL — ABNORMAL LOW (ref 3.5–5.0)
Alkaline Phosphatase: 38 U/L (ref 38–126)
Anion gap: 9 (ref 5–15)
BUN: 20 mg/dL (ref 8–23)
CO2: 23 mmol/L (ref 22–32)
Calcium: 8.6 mg/dL — ABNORMAL LOW (ref 8.9–10.3)
Chloride: 106 mmol/L (ref 98–111)
Creatinine, Ser: 1.03 mg/dL — ABNORMAL HIGH (ref 0.44–1.00)
GFR calc Af Amer: 56 mL/min — ABNORMAL LOW (ref >60)
GFR calc non Af Amer: 48 mL/min — ABNORMAL LOW (ref >60)
Glucose, Bld: 105 mg/dL — ABNORMAL HIGH (ref 70–99)
Potassium: 3.8 mmol/L (ref 3.5–5.1)
Sodium: 138 mmol/L (ref 135–145)
Total Bilirubin: 1.4 mg/dL — ABNORMAL HIGH (ref 0.3–1.2)
Total Protein: 5.4 g/dL — ABNORMAL LOW (ref 6.5–8.1)

## 2018-09-02 LAB — CBC
HCT: 18.5 % — ABNORMAL LOW (ref 36.0–46.0)
Hemoglobin: 6.4 g/dL — CL (ref 12.0–15.0)
MCH: 38.6 pg — ABNORMAL HIGH (ref 26.0–34.0)
MCHC: 34.6 g/dL (ref 30.0–36.0)
MCV: 111.4 fL — ABNORMAL HIGH (ref 80.0–100.0)
Platelets: 88 10*3/uL — ABNORMAL LOW (ref 150–400)
RBC: 1.66 MIL/uL — ABNORMAL LOW (ref 3.87–5.11)
WBC: 2.1 10*3/uL — ABNORMAL LOW (ref 4.0–10.5)
nRBC: 0 % (ref 0.0–0.2)

## 2018-09-02 LAB — HEMOGLOBIN AND HEMATOCRIT, BLOOD
HCT: 24.3 % — ABNORMAL LOW (ref 36.0–46.0)
Hemoglobin: 8.7 g/dL — ABNORMAL LOW (ref 12.0–15.0)

## 2018-09-02 LAB — PREPARE RBC (CROSSMATCH)

## 2018-09-02 MED ORDER — ENSURE ENLIVE PO LIQD: 237.0000 mL | Freq: Two times a day (BID) | ORAL | Status: DC

## 2018-09-02 MED ORDER — SODIUM CHLORIDE 0.9% IV SOLUTION: Freq: Once | INTRAVENOUS | Status: DC

## 2018-09-02 MED ORDER — CYANOCOBALAMIN 1000 MCG/ML IJ SOLN
1000.0000 ug | Freq: Once | INTRAMUSCULAR | Status: AC
  Administered 2018-09-02: 1000 ug via INTRAMUSCULAR
  Filled 2018-09-02: qty 1

## 2018-09-02 MED ORDER — ADULT MULTIVITAMIN LIQUID CH
15.0000 mL | Freq: Every day | ORAL | Status: DC
  Filled 2018-09-02: qty 15

## 2018-09-02 NOTE — Evaluation (Signed)
Physical Therapy Evaluation Patient Details Name: Joann [name] MRN: [id number] DOB: 1927-03-04 Today's Date: 09/02/2018   History of Present Illness  Pt arrives here with family after having a low hemoglobin lab done by pcp per family it was 36. Family had taken her her to see doctor due to being more tired and being in the bed a lot.  Clinical Impression  Pt was assessed and discharged home before her son could convince her to agree to [facility].  Follow up should be with HHPT as pt is a higher fall risk, and will be concerning with her cognitive changes making her distractible.  Acute therapy was not possible as pt is discharged as PT is inputting this note, and will recommend her to be considered by family for extended care such as ALF.      Follow Up Recommendations Home health PT;Supervision/Assistance - 24 hour;Supervision for mobility/OOB    Equipment Recommendations  None recommended by PT    Recommendations for Other Services       Precautions / Restrictions Precautions Precautions: Fall Precaution Comments: confusion Restrictions Weight Bearing Restrictions: No      Mobility  Bed Mobility Overal bed mobility: Independent             General bed mobility comments: sitting bedside when PT arrived  Transfers Overall transfer level: Needs assistance Equipment used: 1 person hand held assist Transfers: Sit to/from Stand Sit to Stand: Min guard            Ambulation/Gait Ambulation/Gait assistance: Min guard;Min assist Gait Distance (Feet): 150 Feet Assistive device: 1 person hand held assist Gait Pattern/deviations: Step-through pattern;Decreased stride length;Wide base of support;Drifts right/left;Shuffle Gait velocity: reduced Gait velocity interpretation: <1.8 ft/sec, indicate of risk for recurrent falls General Gait Details: pt is walking distracted, frequently stopping to look into other pt's rooms, and to think about questions.   Stairs             Wheelchair Mobility    Modified Rankin (Stroke Patients Only)       Balance Overall balance assessment: Needs assistance Sitting-balance support: Feet supported Sitting balance-Leahy Scale: Good     Standing balance support: Single extremity supported Standing balance-Leahy Scale: Fair Standing balance comment: less than fair with dynamic gait  tasks                             Pertinent Vitals/Pain Pain Assessment: No/denies pain    Home Living Family/patient expects to be discharged to:: Private residence Living Arrangements: Alone Available Help at Discharge: Available PRN/intermittently Type of Home: Apartment Home Access: Level entry     Home Layout: One level Home Equipment: Environmental consultant - 2 wheels Additional Comments: has RW but does not use    Prior Function Level of Independence: Independent               Hand Dominance   Dominant Hand: Right    Extremity/Trunk Assessment   Upper Extremity Assessment Upper Extremity Assessment: Overall WFL for tasks assessed    Lower Extremity Assessment Lower Extremity Assessment: Overall WFL for tasks assessed    Cervical / Trunk Assessment Cervical / Trunk Assessment: Kyphotic  Communication   Communication: No difficulties  Cognition Arousal/Alertness: Awake/alert Behavior During Therapy: Impulsive Overall Cognitive Status: History of cognitive impairments - at baseline  General Comments General comments (skin integrity, edema, etc.): has son in attendance, talked with him about her inability to go home alone.  Pt is declining to walk to stairwell to test stairs    Exercises     Assessment/Plan    PT Assessment Patient needs continued PT services  PT Problem List Decreased strength;Decreased range of motion;Decreased activity tolerance;Decreased balance;Decreased mobility;Decreased coordination;Decreased cognition;Decreased safety  awareness       PT Treatment Interventions DME instruction;Gait training;Stair training;Functional mobility training;Therapeutic activities;Therapeutic exercise;Balance training;Neuromuscular re-education;Patient/family education    PT Goals (Current goals can be found in the Care Plan section)  Acute Rehab PT Goals Patient Stated Goal: get home soon PT Goal Formulation: With patient/family Time For Goal Achievement: 09/16/18 Potential to Achieve Goals: Good    Frequency Min 3X/week   Barriers to discharge Inaccessible home environment;Decreased caregiver support stairs to enter and home alone    Co-evaluation               AM-PAC PT "6 Clicks" Mobility  Outcome Measure Help needed turning from your back to your side while in a flat bed without using bedrails?: None Help needed moving from lying on your back to sitting on the side of a flat bed without using bedrails?: A Little Help needed moving to and from a bed to a chair (including a wheelchair)?: A Little Help needed standing up from a chair using your arms (e.g., wheelchair or bedside chair)?: A Little Help needed to walk in hospital room?: A Little Help needed climbing 3-5 steps with a railing? : A Lot 6 Click Score: 18    End of Session Equipment Utilized During Treatment: Gait belt Activity Tolerance: Patient limited by fatigue;Other (comment)(dementia with inattention to the task) Patient left: in bed;with call bell/phone within reach;with family/visitor present Nurse Communication: Mobility status PT Visit Diagnosis: Unsteadiness on feet (R26.81);Difficulty in walking, not elsewhere classified (R26.2);Adult, failure to thrive (R62.7)    Time: [id number] PT Time Calculation (min) (ACUTE ONLY): 26 min   Charges:   PT Evaluation $PT Eval Moderate Complexity: 1 Mod PT Treatments $Gait Training: 8-22 mins       Ivar DrapeRuth E Caesar Mannella 09/02/2018, 3:07 PM   Samul Dadauth Aleathia Purdy, PT MS Acute Rehab Dept. Number: [facility] [phone]  and [facility] [phone]

## 2018-09-02 NOTE — Progress Notes (Signed)
Initial Nutrition Assessment  DOCUMENTATION CODES:   Underweight  INTERVENTION:  -Education on food sources of B12 -Ensure Enlive po BID, each supplement provides 350 kcal and 20 grams of protein -CIB po daily, each supplement provides 220 kcal 13 grams protein, and 25% DV for B12 -liquid MVI with minerals   NUTRITION DIAGNOSIS:   Altered nutrition lab value related to chronic illness(dementia) as evidenced by other (comment)(non-compliance with oral supplemetation for significant history of B12 deficiency; macrocytic anemia; Hgb 6.4).   GOAL:   Patient will meet greater than or equal to 90% of their needs   MONITOR:   Labs, PO intake, Supplement acceptance, Weight trends  REASON FOR ASSESSMENT:   Consult Assessment of nutrition requirement/status  ASSESSMENT:  RD working remotely.  85 year old female with past medical history significant of prior stroke, osteopenia, HLD, mixed Alzheimer's/vasucular dementia, hypercholesterolemia, and vitamin B12 deficiency. Patient presented to ED at the direction of PCP for evaluation of anemia  Per chart review patient reports she has been feeling weak and daughter has noticed increased fatigue and decreased activity over the past couple of months.   Patient with severe macrocytic anemia due to B12 deficiency and will plan for intramuscular injection 100 mcg once per week until anemia corrected, then monthly. Patient has significant history of vitamin B12 deficiency and reported to be non-adherent with vitamin supplementation secondary to difficulties remembering to take medications per report of daughter.   Spoke with daughter via phone. She stated that her mother "is very stubborn and forgetful because of her dementia" Patient lives alone and routinely has oatmeal each morning and is able to prepare basic meals. She "loves chicken" and heats up chicken nuggets for lunch on a regular basis. Daughter provides dinner a few times a week and  reports a meat with vegetables and rice. RD provided food sources of vitamin B12 that require little to no preparation (tuna fish, hard boiled eggs, yogurt, fortified cereals, carnation instant breakfast and suggested using milk to prepare morning oatmeal) Daughter appreciative of RD suggestions and reports mentioned items as foods her mother enjoys.   RD to order CIB for patient on breakfast tray and Ensure between meals to assist with calorie and protein needs.   Leukopenia likely d/t B12 deficiency, per MD note recommend Hematology referral if not improved with supplement.   Current wt 56.3 kg (123.9 lbs) 7/29 noted to be 57.3 kg; 2.2 lb wt loss in 1 week, but suspect wt change possibly inaccurate No recent wt history for review; prior 61.2 kg taken in 08/2016  Medications reviewed  Labs: Ca corrects 9.16 WBC 2.1 - likely d/t B12 deficeincy Hgb 6.4 - trending down   NUTRITION - FOCUSED PHYSICAL EXAM: Unable to complete at this time   Diet Order:   Diet Order            Diet regular Room service appropriate? Yes; Fluid consistency: Thin  Diet effective now              EDUCATION NEEDS:   Education needs have been addressed  Skin:  Skin Assessment: Reviewed RN Assessment  Last BM:  8/3  Height:   Ht Readings from Last 1 Encounters:  09/01/18 5\' 10"  (1.778 m)    Weight:   Wt Readings from Last 1 Encounters:  09/01/18 56.3 kg    Ideal Body Weight:  68.2 kg  BMI:  Body mass index is 17.81 kg/m.  Estimated Nutritional Needs:   Kcal:  1500-1700  Protein:  75-85  Fluid:  >1.5L   Lars MassonSuzanne Leiah Giannotti, RD, LDN  After Hours/Weekend Pager: [phone]

## 2018-09-02 NOTE — Progress Notes (Addendum)
0949: Call to daughter [name], updated on condition.  1030: Patient up waking in hallway, bed alarm going off. Assisted patient back to bed, bed alarm reset. Reminded patient that physician will let her know if she will be discharged today.  1405: Patient discharged with personal belongings. Son Jacqulynn Cadet at bedside. Discharge instructions provided.

## 2018-09-02 NOTE — Evaluation (Signed)
Occupational Therapy Evaluation Patient Details Name: Joann [name] MRN: [id number] DOB: 06-26-1927 Today's Date: 09/02/2018    History of Present Illness Pt arrives here with family after having a low hemoglobin lab done by pcp per family it was 63. Family had taken her her to see doctor due to being more tired and being in the bed a lot.   Clinical Impression   Pt is at baseline independent level of function with ADLs, mobility and light meal prep. Pt up walking around in room upon OT arrival. Pt's son present and confirmed pt's report of independence. Pt's son reports that his sister lives nearby to pt and gets her groceries and takes her to appointments. All education completed and no further acute OT is indicated at this time    Follow Up Recommendations  No OT follow up    Equipment Recommendations  None recommended by OT    Recommendations for Other Services       Precautions / Restrictions Precautions Precautions: None Restrictions Weight Bearing Restrictions: No      Mobility Bed Mobility Overal bed mobility: Independent                Transfers Overall transfer level: Independent                    Balance Overall balance assessment: No apparent balance deficits (not formally assessed)                                         ADL either performed or assessed with clinical judgement   ADL Overall ADL's : At baseline;Independent                                             Vision Baseline Vision/History: Wears glasses Wears Glasses: At all times Patient Visual Report: No change from baseline       Perception     Praxis      Pertinent Vitals/Pain Pain Assessment: No/denies pain     Hand Dominance Right   Extremity/Trunk Assessment Upper Extremity Assessment Upper Extremity Assessment: Overall WFL for tasks assessed   Lower Extremity Assessment Lower Extremity Assessment: Defer to PT evaluation    Cervical / Trunk Assessment Cervical / Trunk Assessment: Normal   Communication Communication Communication: No difficulties   Cognition Arousal/Alertness: Awake/alert Behavior During Therapy: WFL for tasks assessed/performed Overall Cognitive Status: Within Functional Limits for tasks assessed                                     General Comments       Exercises     Shoulder Instructions      Home Living Family/patient expects to be discharged to:: Private residence   Available Help at Discharge: Available PRN/intermittently Type of Home: Apartment Home Access: Level entry     Home Layout: One level     Bathroom Shower/Tub: Tub/shower unit;Curtain   Biochemist, clinical: Standard     Home Equipment: Environmental consultant - 2 wheels   Additional Comments: has RW but does not use      Prior Functioning/Environment Level of Independence: Independent  OT Problem List: Decreased activity tolerance      OT Treatment/Interventions:      OT Goals(Current goals can be found in the care plan section) Acute Rehab OT Goals Patient Stated Goal: go home today OT Goal Formulation: With patient/family  OT Frequency:     Barriers to D/C:    no barriers       Co-evaluation              AM-PAC OT "6 Clicks" Daily Activity     Outcome Measure Help from another person eating meals?: None Help from another person taking care of personal grooming?: None Help from another person toileting, which includes using toliet, bedpan, or urinal?: None Help from another person bathing (including washing, rinsing, drying)?: None Help from another person to put on and taking off regular upper body clothing?: None Help from another person to put on and taking off regular lower body clothing?: None 6 Click Score: 24   End of Session    Activity Tolerance: Patient tolerated treatment well Patient left: in bed;with family/visitor present(sitting EOB)  OT  Visit Diagnosis: Muscle weakness (generalized) (M62.81)                Time: [id number] OT Time Calculation (min): 21 min Charges:  OT General Charges $OT Visit: 1 Visit OT Evaluation $OT Eval Low Complexity: 1 Low    Galen ManilaSpencer, Namir Neto Jeanette 09/02/2018, 1:08 PM

## 2018-09-02 NOTE — Discharge Instructions (Signed)
Thank you for allowing [place] to take part in your care.   Joann [name] was admitted to the hospital for low blood counts (anemia and pancytopenia) and her need to have this corrected with a blood transfusion. Her hemoglobin has increased to appropriate levels and she is now safe to discharge home.   Please return to care if Joann [name] becomes tired again, begins to have dark stools or has increased dizziness or passes out.   Please follow up with your primary care provider for regular vitamin B12 injections as this is the most likely cause for your low blood counts. You will receive these injections every week until your vitamin B12 levels are corrected.       Anemia  Anemia is a condition in which you do not have enough red blood cells or hemoglobin. Hemoglobin is a substance in red blood cells that carries oxygen. When you do not have enough red blood cells or hemoglobin (are anemic), your body cannot get enough oxygen and your organs may not work properly. As a result, you may feel very tired or have other problems. What are the causes? Common causes of anemia include:  Excessive bleeding. Anemia can be caused by excessive bleeding inside or outside the body, including bleeding from the intestine or from periods in women.  Poor nutrition.  Long-lasting (chronic) kidney, thyroid, and liver disease.  Bone marrow disorders.  Cancer and treatments for cancer.  HIV (human immunodeficiency virus) and AIDS (acquired immunodeficiency syndrome).  Treatments for HIV and AIDS.  Spleen problems.  Blood disorders.  Infections, medicines, and autoimmune disorders that destroy red blood cells. What are the signs or symptoms? Symptoms of this condition include:  Minor weakness.  Dizziness.  Headache.  Feeling heartbeats that are irregular or faster than normal (palpitations).  Shortness of breath, especially with exercise.  Paleness.  Cold  sensitivity.  Indigestion.  Nausea.  Difficulty sleeping.  Difficulty concentrating. Symptoms may occur suddenly or develop slowly. If your anemia is mild, you may not have symptoms. How is this diagnosed? This condition is diagnosed based on:  Blood tests.  Your medical history.  A physical exam.  Bone marrow biopsy. Your health care provider may also check your stool (feces) for blood and may do additional testing to look for the cause of your bleeding. You may also have other tests, including:  Imaging tests, such as a CT scan or MRI.  Endoscopy.  Colonoscopy. How is this treated? Treatment for this condition depends on the cause. If you continue to lose a lot of blood, you may need to be treated at a hospital. Treatment may include:  Taking supplements of iron, vitamin P80, or folic acid.  Taking a hormone medicine (erythropoietin) that can help to stimulate red blood cell growth.  Having a blood transfusion. This may be needed if you lose a lot of blood.  Making changes to your diet.  Having surgery to remove your spleen. Follow these instructions at home:  Take over-the-counter and prescription medicines only as told by your health care provider.  Take supplements only as told by your health care provider.  Follow any diet instructions that you were given.  Keep all follow-up visits as told by your health care provider. This is important. Contact a health care provider if:  You develop new bleeding anywhere in the body. Get help right away if:  You are very weak.  You are short of breath.  You have pain in your abdomen or chest.  You are dizzy or feel faint.  You have trouble concentrating.  You have bloody or black, tarry stools.  You vomit repeatedly or you vomit up blood. Summary  Anemia is a condition in which you do not have enough red blood cells or enough of a substance in your red blood cells that carries oxygen  (hemoglobin).  Symptoms may occur suddenly or develop slowly.  If your anemia is mild, you may not have symptoms.  This condition is diagnosed with blood tests as well as a medical history and physical exam. Other tests may be needed.  Treatment for this condition depends on the cause of the anemia. This information is not intended to replace advice given to you by your health care provider. Make sure you discuss any questions you have with your health care provider. Document Released: 02/22/2004 Document Revised: 12/27/2016 Document Reviewed: 02/16/2016 Elsevier Patient Education  2020 [employer].        Pancytopenia Pancytopenia is a condition in which a person has an abnormally low amount (deficiency) of the following blood cells:  Red blood cells (RBCs). Having too few RBCs is called anemia.  White blood cells (WBCs). Having too few WBCs is called leukopenia.  Cells that help the blood clot (platelets). Having too few platelets is called thrombocytopenia. Cells that become blood cells (stem cells) are made in the soft tissue inside the bones (bone marrow). All blood cells have a limited lifespan. Blood cells are constantly replaced with new blood cells from the bone marrow. Pancytopenia can be caused by any condition or disease that:  Destroys the ability of bone marrow to make blood cells.  Causes bone marrow to make blood cells that cannot survive after they leave the bone marrow. What are the causes? There are many possible causes of this condition. In some cases, the cause is not known. Common causes of the condition include:  A disease that causes bone marrow to make immature blood cells (megaloblastic anemia).  A blood disorder that makes bone marrow unable to produce enough new RBCs (aplastic anemia or bone marrow failure).  An enlarged spleen (hypersplenism). An enlarged spleen can trap blood cells and destroy them faster than they can be replaced.  Inherited  diseases of the blood or bone marrow.  Cancers that affect bone marrow.  Certain medicines, such as: ? Chemotherapy. ? Medicines that reduce the activity of the immune system (immunosuppressant medicines).  Exposure to radiation.  Severe infections. What increases the risk? You are more likely to develop this condition if:  You are 16?83 years old.  You are female.  You have a family history of a blood or bone marrow disease.  You have certain conditions, such as: ? Alcohol use disorder. ? HIV (human immunodeficiency virus) or AIDS (acquired immunodeficiency syndrome). ? Cancer. ? Conditions in which the body's disease-fighting system attacks normal tissues (autoimmune diseases). What are the signs or symptoms? Symptoms of this condition vary depending on the cause and may include:  Anemia.  Weakness.  Shortness of breath.  Unusual bruising and bleeding.  Frequent infections.  Fatigue.  Fever.  Pale skin.  Bone pain.  Night sweats.  Weight loss.  Headache.  Dizziness.  Feeling unusually cold. How is this diagnosed? This condition may be diagnosed based on:  Your symptoms.  Your medical history.  A physical exam.  Tests. These may include: ? Removal of a sample of bone marrow to be examined under a microscope (biopsy). This is done by inserting a  needle into a bone to remove fluid and cells (aspiration). ? A complete blood count (CBC). This is a group of tests that measures characteristics of WBCs, RBCs, and platelets. ? A peripheral blood smear. This test examines your blood under a microscope to provide information about drugs and diseases that affect RBCs, WBCs, and platelets. ? Reticulocyte count. This is a test that measures the amount of new or immature RBCs (reticulocytes) that are made by your bone marrow. ? Imaging studies of your spleen or liver, such as X-rays. ? A test to measure your vitamin B12 level. ? Tests for viruses. How is this  treated? Treatment for this condition depends on the cause. Treatment may include:  Immunosuppressant medicines.  Antibiotic medicine.  Vitamin B12. This may be given as a treatment for megaloblastic anemia.  Medicines that help the bone marrow make blood cells (bone marrow stimulating drugs).  A bone marrow transplant.  Receiving donated blood through an IV (blood transfusion).  A procedure to remove your spleen (splenectomy). This may be done as a treatment for hypersplenism. Follow these instructions at home: Caring for your body      Wash your hands often with soap and water. If soap and water are not available, use hand sanitizer.  Brush your teeth twice a day, and floss at least once a day. It is recommended that you visit the dentist every 6 months.  Stay up to date on your vaccinations, including a yearly (annual) flu shot. Ask your health care provider which vaccines you should get. These may include a pneumonia vaccine. Medicines  Take over-the-counter and prescription medicines only as told by your health care provider.  If you were prescribed an antibiotic medicine, take it as told by your health care provider. Do not stop taking the antibiotic even if you start to feel better. Lifestyle  Do not participate in contact sports or dangerous activities. Ask your health care provider what activities are safe for you.  During cold and flu season, avoid crowded places and avoid contact with people who are sick. Flu season is typically between the months of October and May. General instructions  Work with your health care provider to manage your condition and educate yourself about your condition.  Follow food safety recommendations as told by your health care provider.  Keep all follow-up visits as told by your health care provider. This is important. Contact a health care provider if you:  Have a fever.  Bruise or bleed easily.  Are dizzy.  Feel unusually  weak or tired. Get help right away if you have:  Bleeding that does not stop.  Wheezing or shortness of breath.  Chest pain. These symptoms may represent a serious problem that is an emergency. Do not wait to see if the symptoms will go away. Get medical help right away. Call your local emergency services (911 in the [place] Do not drive yourself to the hospital. Summary  Pancytopenia is a condition in which a person has an abnormally low amount of red blood cells, white blood cells, and platelets.  There are many possible causes of this condition.  Treatment for this condition depends on the cause.  Stay up to date on your vaccinations.  Do not participate in contact sports or dangerous activities. Ask your health care provider what activities are safe for you. This information is not intended to replace advice given to you by your health care provider. Make sure you discuss any questions you have with your  health care provider. Document Released: 02/10/2015 Document Revised: 10/20/2017 Document Reviewed: 10/20/2017 Elsevier Patient Education  2020 [employer].

## 2018-09-02 NOTE — Progress Notes (Signed)
CURBSIDE NOTE:   Appreciated discussing this case with Dr Rosita Fire by phone. In brief, Joann [name] is an 85 y/o [place] woman with severe B-12 deficiency. Thi causes pancytopenia, ineffective erythropoiesis (elevated LDH), neurologic problems such as subacute combined degeneration and can contribute to dementia.  The cause is most likely antibodies to intrinsic factor. In patients with this problem anti-thyroid antibodies can also develop and will need follow up.  I suggest parenteral B-12 daily for one week, then weekly for 4 weeks, then monthly indefinitely.  Note that when RBC recovery start hypokalemia may develop-- it is good practice to replenish potassium the first 24-48 h of B-12 dosing, for example 20 mEq daily x 2 days  Please let me know if I can be of further help.

## 2018-09-02 NOTE — Discharge Summary (Signed)
Joann [name]  Patient name: Joann [name] Medical record number: [id number] Date of birth: November 02, 1927 Age: 85 y.o. Gender: female Date of Admission: 09/01/2018  Date of Discharge: 09/02/18 Admitting Physician: [place] Shirley, DO  Primary Care Provider: Gerlene Fee, DO Consultants: PT/OT  Indication for Hospitalization: Symptomatic Macrocytic Anemia   Discharge Diagnoses/Problem List:   Disposition: Stable for discharge home   Discharge Condition: stable and improved   Discharge Exam:   General: elderly female lying in bed in NAD  Cardiovascular: RRR without murmurs, gallops or rubs  Respiratory: CTAB without wheezing or crackles on exam  Abdomen: soft, NT, ND  Extremities: non edematous, dry skin   Brief Hospital Course:  Joann [name] was seen during a outpatient one week prior to admission for increased sleeping and concern for intermittent dizziness. Patient had CBC completed during primary care visit and found to have hemoglobin of 6.6 and vitamin B12 <50.  Patient was notified of low hemoglobin and recommended to visit the ED for blood transfusion and admission. Upon admission, Joann [name]'s hemoglobin was 6.4. In between her first and second transfusion, patient was noted to have drop in hbg from 7.2 to 6.4 so second unit was given with subesequent hemoglobin of 8.7. During admission, she was seen by PT/OT who had no recommendations upon discharge. Joann [name]'s information was also reviewed by heme-oncology. She was recommended for outpatient follow up and B12 injections per plan detailed below in follow up issues section.  Issues for Follow Up:  1. Vitamin B12 Deficiency   Heme-Onc Recommendations:   -Parenteral B-12 injections daily for one week, then weekly for 4 weeks, then monthly indefinitely.  -Note that when RBC recovery starts, hypokalemia may develop, suggest replenishing potassium in the first 24-48 h of B-12  dosing 2. Macrocytic Anemia  3. Pancytopenia   Significant Procedures:  pRBC transfusions x 2   Significant Labs and Imaging:  Recent Labs  Lab 08/26/18 1511  09/01/18 1102 09/01/18 2044 09/02/18 0318 09/02/18 1142  WBC 2.5*  --  2.6*  --  2.1*  --   HGB 6.6*   < > 6.4* 7.2* 6.4* 8.7*  HCT 18.1*   < > 18.6* 20.7* 18.5* 24.3*  PLT 96*  --  110*  --  88*  --    < > = values in this interval not displayed.   Recent Labs  Lab 08/26/18 1511 09/01/18 1102 09/02/18 0318  NA 137 138 138  K 4.2 4.0 3.8  CL 102 106 106  CO2 21 23 23   GLUCOSE 107* 146* 105*  BUN 16 20 20   CREATININE 0.90 0.94 1.03*  CALCIUM 9.0 8.9 8.6*  ALKPHOS 50 49 38  AST 20 18 16   ALT 12 12 12   ALBUMIN 4.5 4.0 3.3*      Results/Tests Pending at Time of Discharge: None  Discharge Medications:  Allergies as of 09/02/2018      Reactions   Sulfonamide Derivatives Other (See Comments)   Exact reaction not recalled- patient remarked it made her "not feel well"      Medication List    You have not been prescribed any medications.     Discharge Instructions: Please refer to Patient Instructions section of EMR for full details.  Patient was counseled important signs and symptoms that should prompt return to medical care, changes in medications, dietary instructions, activity restrictions, and follow up appointments.   Follow-Up Appointments: Follow-up Information    Shirley, [place], DO. Go on 09/08/2018.  Specialty: Family Medicine Why: Your appointment is at 2:10pm. Please arrive 10 minutes early.  Contact information: [address] [place] [address] [phone]          Nicki GuadalajaraSimmons, Cerys Winget, MD 09/02/2018, 2:28 PM PGY-1, [facility]

## 2018-09-03 LAB — TYPE AND SCREEN
ABO/RH(D): A POS
Antibody Screen: NEGATIVE
Unit division: 0
Unit division: 0

## 2018-09-03 LAB — BPAM RBC
Blood Product Expiration Date: 202008242359
Blood Product Expiration Date: 202008242359
ISSUE DATE / TIME: 202008041508
ISSUE DATE / TIME: 202008050650
Unit Type and Rh: 6200
Unit Type and Rh: 6200

## 2018-09-03 LAB — PATHOLOGIST SMEAR REVIEW

## 2018-09-08 ENCOUNTER — Other Ambulatory Visit: Payer: Self-pay

## 2018-09-08 ENCOUNTER — Ambulatory Visit (INDEPENDENT_AMBULATORY_CARE_PROVIDER_SITE_OTHER): Payer: Medicare Other | Admitting: Family Medicine

## 2018-09-08 ENCOUNTER — Encounter: Payer: Self-pay | Admitting: Family Medicine

## 2018-09-08 VITALS — BP 118/59 | HR 71 | Temp 98.8°F | Wt 125.0 lb

## 2018-09-08 DIAGNOSIS — E538 Deficiency of other specified B group vitamins: Secondary | ICD-10-CM

## 2018-09-08 MED ORDER — CYANOCOBALAMIN 1000 MCG/ML IJ SOLN
1000.0000 ug | Freq: Once | INTRAMUSCULAR | Status: AC
  Administered 2018-09-08: 1000 ug via INTRAMUSCULAR

## 2018-09-08 NOTE — Patient Instructions (Signed)
Thank you for coming to see me today. It was a pleasure! Today we talked about:   We will call you with your results and schedule her a visit to have another weekly vitamin B12 injection. She will need two more and then monthly injections.   Please follow-up as needed.  If you have any questions or concerns, please do not hesitate to call the office at [phone].  Take Care,   Martinique Cannon Arreola, DO  Vitamin B12 Deficiency Vitamin B12 deficiency means that your body does not have enough vitamin B12. The body needs this vitamin:  To make red blood cells.  To make genes (DNA).  To help the nerves work. If you do not have enough vitamin B12 in your body, you can have health problems. What are the causes?  Not eating enough foods that contain vitamin B12.  Not being able to absorb vitamin B12 from the food that you eat.  Certain digestive system diseases.  A condition in which the body does not make enough of a certain protein, which results in too few red blood cells (pernicious anemia).  Having a surgery in which part of the stomach or small intestine is removed.  Taking medicines that make it hard for the body to absorb vitamin B12. These medicines include: ? Heartburn medicines. ? Some antibiotic medicines. ? Other medicines that are used to treat certain conditions. What increases the risk?  Being older than age 77.  Eating a vegetarian or vegan diet, especially while you are pregnant.  Eating a poor diet while you are pregnant.  Taking certain medicines.  Having alcoholism. What are the signs or symptoms? In some cases, there are no symptoms. If the condition leads to too few blood cells or nerve damage, symptoms can occur, such as:  Feeling weak.  Feeling tired (fatigued).  Not being hungry.  Weight loss.  A loss of feeling (numbness) or tingling in your hands and feet.  Redness and burning of the tongue.  Being mixed up (confused) or having memory  problems.  Sadness (depression).  Problems with your senses. This can include color blindness, ringing in the ears, or loss of taste.  Watery poop (diarrhea) or trouble pooping (constipation).  Trouble walking. If anemia is very bad, symptoms can include:  Being short of breath.  Being dizzy.  Having a very fast heartbeat. How is this treated?  Changing the way you eat and drink, such as: ? Eating more foods that contain vitamin B12. ? Drinking little or no alcohol.  Getting vitamin B12 shots.  Taking vitamin B12 supplements. Your doctor will tell you the dose that is best for you. Follow these instructions at home: Eating and drinking   Eat lots of healthy foods that contain vitamin B12. These include: ? Meats and poultry, such as beef, pork, chicken, [place], and organ meats, such as liver. ? Seafood, such as clams, rainbow trout, salmon, tuna, and haddock. ? Eggs. ? Cereal and dairy products that have vitamin B12 added to them. Check the label. The items listed above may not be a complete list of what you can eat and drink. Contact a dietitian for more options. General instructions  Get any shots as told by your doctor.  Take supplements only as told by your doctor.  Do not drink alcohol if your doctor tells you not to. In some cases, you may only be asked to limit alcohol use.  Keep all follow-up visits as told by your doctor. This is  important. Contact a doctor if:  Your symptoms come back. Get help right away if:  You have trouble breathing.  You have a very fast heartbeat.  You have chest pain.  You get dizzy.  You pass out. Summary  Vitamin B12 deficiency means that your body is not getting enough vitamin B12.  In some cases, there are no symptoms of this condition.  Treatment may include making a change in the way you eat and drink, getting vitamin B12 shots, or taking supplements.  Eat lots of healthy foods that contain vitamin B12. This  information is not intended to replace advice given to you by your health care provider. Make sure you discuss any questions you have with your health care provider. Document Released: 01/03/2011 Document Revised: 09/23/2017 Document Reviewed: 09/23/2017 Elsevier Patient Education  2020 [employer]

## 2018-09-08 NOTE — Progress Notes (Signed)
  Subjective:  Patient ID: Joann [name]  DOB: [birth year] MRN: [id number]  Joann [name] is a 85 y.o. female with a PMH of vitamin B12 deficiency, alzheimer's and vascular dementia, Vitamin D deficiency, HLD, tobacco dependence and osteopenia, here today for macrocytic anemia hospital f/u.   HPI:  Vitamin B12 Deficiency  Macrocytic Anemia: - patient d/c'd from hospital on 8/4 after receiving B12 injections and being monitored for her severe anemia. She received 2u pRBC"s. Hematology was consulted who recommended: B-12 daily for one week, then weekly for 4 weeks, then monthly indefinitely. hypokalemia may develop-- it is good practice to replenish potassium the first 24-48 h of B-12 dosing, for example 20 mEq daily x 2 days.  -Patient here for follow up today and reports feeling better than she has in a while. Her daughter has started trying to help her increase more variety in her diet. Patient has been walking to the mailbox without issue. -she denies any lightheadedness, dizziness, SOB,CP, fatigue. -no further signs of bleeding, so doubt patient will need GI work up  ROS: as mentioned in HPI  Smoking status reviewed  Patient Active Problem List   Diagnosis Date Noted  . Anemia 09/01/2018  . Increased sleeping 08/26/2018  . Swollen ankles 11/09/2015  . Vitamin B12 deficiency 04/28/2015  . Benign Ethnic Leukopenia 04/28/2015  . Stroke, lacunar ([place]) 04/28/2015  . Mixed Alzheimer's and vascular dementia ([place]) 03/23/2015  . Memory loss of unknown cause 10/05/2014  . Vitamin D deficiency 06/13/2011  . KNEE PAIN, LEFT, CHRONIC 02/02/2009  . ANEMIA, PERNICIOUS 09/21/2007  . HYPERLIPIDEMIA 03/27/2006  . TOBACCO DEPENDENCE 03/27/2006  . NEUROPATHY, PERIPHERAL 03/27/2006  . OSTEOPENIA 03/27/2006     Objective:  BP (!) 118/59   Pulse 71   Temp 98.8 F (37.1 C) (Oral)   Wt 125 lb (56.7 kg)   SpO2 98%   BMI 17.94 kg/m   Vitals and nursing note reviewed  General: NAD, pleasant,  well-appearing Pulm: normal effort Extremities: no edema or cyanosis. WWP. Skin: warm and dry, no rashes noted Neuro: alert and oriented, no focal deficits Psych: normal affect, normal thought content  Assessment & Plan:   Vitamin B12 deficiency Patient received vit B12 injection in office. She is asymptomatic and overall well-appearing. -CBC with improved hgb to 9.5> doubt patient has GI bleed, will defer work up and will need to continue to monitor for any new sx -bmp does not show hypokalemia -scheduled return lab visit in 1 week (8/18), at which time patient will need to schedule last injection one week later, followed by monthly injections indefinitely   Martinique Jernard Reiber, DO Family Medicine Resident PGY-3

## 2018-09-09 LAB — BASIC METABOLIC PANEL
BUN/Creatinine Ratio: 20 (ref 12–28)
BUN: 18 mg/dL (ref 8–27)
CO2: 20 mmol/L (ref 20–29)
Calcium: 9 mg/dL (ref 8.7–10.3)
Chloride: 104 mmol/L (ref 96–106)
Creatinine, Ser: 0.9 mg/dL (ref 0.57–1.00)
GFR calc Af Amer: 66 mL/min/{1.73_m2} (ref >59)
GFR calc non Af Amer: 57 mL/min/{1.73_m2} — ABNORMAL LOW (ref >59)
Glucose: 126 mg/dL — ABNORMAL HIGH (ref 65–99)
Potassium: 3.8 mmol/L (ref 3.5–5.2)
Sodium: 139 mmol/L (ref 134–144)

## 2018-09-09 LAB — CBC
Hematocrit: 27.1 % — ABNORMAL LOW (ref 34.0–46.6)
Hemoglobin: 9.5 g/dL — ABNORMAL LOW (ref 11.1–15.9)
MCH: 36.8 pg — ABNORMAL HIGH (ref 26.6–33.0)
MCHC: 35.1 g/dL (ref 31.5–35.7)
MCV: 105 fL — ABNORMAL HIGH (ref 79–97)
Platelets: 173 10*3/uL (ref 150–450)
RBC: 2.58 x10E6/uL — CL (ref 3.77–5.28)
RDW: 18.9 % — ABNORMAL HIGH (ref 11.7–15.4)
WBC: 4.4 10*3/uL (ref 3.4–10.8)

## 2018-09-11 NOTE — Assessment & Plan Note (Addendum)
Patient received vit B12 injection in office. She is asymptomatic and overall well-appearing. -CBC with improved hgb to 9.5> doubt patient has GI bleed, will defer work up and will need to continue to monitor for any new sx -bmp does not show hypokalemia -scheduled return lab visit in 1 week (8/18), at which time patient will need to schedule last injection one week later, followed by monthly injections indefinitely

## 2018-09-15 ENCOUNTER — Ambulatory Visit: Payer: Medicare Other

## 2018-09-18 ENCOUNTER — Ambulatory Visit (INDEPENDENT_AMBULATORY_CARE_PROVIDER_SITE_OTHER): Payer: Medicare Other | Admitting: *Deleted

## 2018-09-18 ENCOUNTER — Other Ambulatory Visit: Payer: Self-pay

## 2018-09-18 DIAGNOSIS — E538 Deficiency of other specified B group vitamins: Secondary | ICD-10-CM | POA: Diagnosis not present

## 2018-09-18 MED ORDER — CYANOCOBALAMIN 1000 MCG/ML IJ SOLN
1000.0000 ug | Freq: Once | INTRAMUSCULAR | Status: AC
  Administered 2018-09-18: 1000 ug via INTRAMUSCULAR

## 2018-09-18 NOTE — Progress Notes (Signed)
Pt is here today for a b12 injection.  She is to have one today and one Thursday.  After that she will get injections monthly. Verified this with Dr. Enid Derry.    Will forward to PCP to see when next blood draw should be. And will inform pt at nurse visit next week.   Pt is here for a b12 injection today.    Date of last office visit that b12 was discussed 09/08/18  Last injection was 09/08/18  Injection given in Right deltoid, pt tolerated well and will stop at front desk to schedule next appt. Christen Bame, CMA

## 2018-09-22 ENCOUNTER — Telehealth: Payer: Self-pay | Admitting: *Deleted

## 2018-09-22 NOTE — Telephone Encounter (Signed)
Daughter, [name], would like for SW to call to her talk about placement for her mom. Christen Bame, CMA

## 2018-09-23 NOTE — Telephone Encounter (Signed)
   Unsuccessful Phone Outreach Note  09/23/2018 Name: Joann [name] MRN: [id number] DOB: [birth year]  Referred by:  CMA Jesscia  Reason for referral : Other (request for SW help) .  Joann [name] is a 85 y.o. year old female who sees Autry-Lott, [place], DO for primary care.  LCSW received referral from Roy that patient's daughter called requesting assistance with placement for patient.  Called patient to assess needs and barriers reference the above referral. Telephone outreach was unsuccessful. A HIPPA compliant phone message was left for the patient providing contact information and requesting a return call.  Follow Up Plan:  LCSW will wait for return call.  Casimer Lanius, LCSW Clinical Social Worker [place] [facility] [place]   [phone] 2:44 PM

## 2018-09-23 NOTE — Telephone Encounter (Signed)
   Care Coordination Social Work Note  09/23/2018 Name: Joann [name] MRN: [id number] DOB: February 24, 1927  Joann [name] is a 85 y.o. year old female who sees Autry-Lott, [place], DO for primary care.  LCSW received return call from patient's daughter [name] with Level of Care Concerns and requesting facility placement support.  Assessment :Patient is temp. Living with her daughter as she is afraid to stay in her home alone.  Daughter [name] who has [place] is not able to continue to care for patient.  Patient has refused placement, personal care services support, meals on wheels and other community resources. Recommendation: daughter to consult with Legal Aide ( guardianship and Durable power of attorney)   Goals: Facility placement for patient Interventions:Provided education to patient/caregiver regarding level of care options., Facility placement process, (nagivating the long-term care process, Legal Aide brochure, and a list of assisted living facilities).  As well as patient information on Demential for Caregivers. Plan:  1. Daughter will review all information provided and contact LCSW if additional assistance is needed  Casimer Lanius, Miguel Barrera / [place]   [phone] 4:23 PM

## 2018-09-24 ENCOUNTER — Other Ambulatory Visit: Payer: Self-pay

## 2018-09-24 ENCOUNTER — Ambulatory Visit (INDEPENDENT_AMBULATORY_CARE_PROVIDER_SITE_OTHER): Payer: Medicare Other

## 2018-09-24 DIAGNOSIS — E538 Deficiency of other specified B group vitamins: Secondary | ICD-10-CM | POA: Diagnosis not present

## 2018-09-24 MED ORDER — CYANOCOBALAMIN 1000 MCG/ML IJ SOLN
1000.0000 ug | Freq: Once | INTRAMUSCULAR | Status: AC
  Administered 2018-09-24: 1000 ug via INTRAMUSCULAR

## 2018-09-24 NOTE — Progress Notes (Signed)
Pt came in today for a B12 injection. Injection given in LD. Pt tolerated well. Pt daughter will schedule next appt in 1 month. Ottis Stain, CMA

## 2018-10-07 ENCOUNTER — Encounter (HOSPITAL_COMMUNITY): Payer: Self-pay | Admitting: *Deleted

## 2018-10-07 ENCOUNTER — Emergency Department (HOSPITAL_COMMUNITY)
Admission: EM | Admit: 2018-10-07 | Discharge: 2018-10-08 | Disposition: A | Discharge status: Home / Self Care | Payer: Medicare Other | Attending: Emergency Medicine | Admitting: Emergency Medicine

## 2018-10-07 DIAGNOSIS — R1030 Lower abdominal pain, unspecified: Secondary | ICD-10-CM | POA: Diagnosis not present

## 2018-10-07 DIAGNOSIS — R309 Painful micturition, unspecified: Secondary | ICD-10-CM | POA: Insufficient documentation

## 2018-10-07 DIAGNOSIS — G309 Alzheimer's disease, unspecified: Secondary | ICD-10-CM | POA: Diagnosis not present

## 2018-10-07 DIAGNOSIS — R300 Dysuria: Secondary | ICD-10-CM | POA: Diagnosis present

## 2018-10-07 DIAGNOSIS — N390 Urinary tract infection, site not specified: Secondary | ICD-10-CM | POA: Diagnosis not present

## 2018-10-07 DIAGNOSIS — F17210 Nicotine dependence, cigarettes, uncomplicated: Secondary | ICD-10-CM | POA: Insufficient documentation

## 2018-10-07 DIAGNOSIS — Z8673 Personal history of transient ischemic attack (TIA), and cerebral infarction without residual deficits: Secondary | ICD-10-CM | POA: Diagnosis not present

## 2018-10-07 LAB — URINALYSIS, ROUTINE W REFLEX MICROSCOPIC
Bacteria, UA: NONE SEEN
Bilirubin Urine: NEGATIVE
Glucose, UA: NEGATIVE mg/dL
Ketones, ur: NEGATIVE mg/dL
Nitrite: NEGATIVE
Protein, ur: >=300 mg/dL — AB
Specific Gravity, Urine: 1.016 (ref 1.005–1.030)
WBC, UA: >50 WBC/hpf — ABNORMAL HIGH (ref 0–5)
pH: 7 (ref 5.0–8.0)

## 2018-10-07 LAB — CBC
HCT: 36.1 % (ref 36.0–46.0)
Hemoglobin: 11.7 g/dL — ABNORMAL LOW (ref 12.0–15.0)
MCH: 32.7 pg (ref 26.0–34.0)
MCHC: 32.4 g/dL (ref 30.0–36.0)
MCV: 100.8 fL — ABNORMAL HIGH (ref 80.0–100.0)
Platelets: 327 10*3/uL (ref 150–400)
RBC: 3.58 MIL/uL — ABNORMAL LOW (ref 3.87–5.11)
RDW: 15.2 % (ref 11.5–15.5)
WBC: 5.2 10*3/uL (ref 4.0–10.5)
nRBC: 0 % (ref 0.0–0.2)

## 2018-10-07 LAB — COMPREHENSIVE METABOLIC PANEL
ALT: 10 U/L (ref 0–44)
AST: 15 U/L (ref 15–41)
Albumin: 3.4 g/dL — ABNORMAL LOW (ref 3.5–5.0)
Alkaline Phosphatase: 62 U/L (ref 38–126)
Anion gap: 7 (ref 5–15)
BUN: 17 mg/dL (ref 8–23)
CO2: 25 mmol/L (ref 22–32)
Calcium: 9 mg/dL (ref 8.9–10.3)
Chloride: 103 mmol/L (ref 98–111)
Creatinine, Ser: 0.96 mg/dL (ref 0.44–1.00)
GFR calc Af Amer: >60 mL/min (ref >60)
GFR calc non Af Amer: 52 mL/min — ABNORMAL LOW (ref >60)
Glucose, Bld: 106 mg/dL — ABNORMAL HIGH (ref 70–99)
Potassium: 4.1 mmol/L (ref 3.5–5.1)
Sodium: 135 mmol/L (ref 135–145)
Total Bilirubin: 0.8 mg/dL (ref 0.3–1.2)
Total Protein: 6.4 g/dL — ABNORMAL LOW (ref 6.5–8.1)

## 2018-10-07 LAB — LIPASE, BLOOD: Lipase: 34 U/L (ref 11–51)

## 2018-10-07 MED ORDER — CEPHALEXIN 500 MG PO CAPS: 500.0000 mg | ORAL_CAPSULE | Freq: Two times a day (BID) | ORAL | 0 refills | Status: AC

## 2018-10-07 MED ORDER — SODIUM CHLORIDE 0.9% FLUSH: 3.0000 mL | Freq: Once | INTRAVENOUS | Status: DC

## 2018-10-07 NOTE — ED Provider Notes (Signed)
[facility] Emergency Department Provider Note MRN:  [id number]  Arrival date & time: 10/07/18     Chief Complaint   Dysuria History of Present Illness   Joann [name] is a 85 y.o. year-old female with a history of mild dementia, stroke, frequent UTI presenting to the ED with chief complaint of dysuria.  Patient explains that she was up all night having to urinate.  Some pressure and burning with urination.  Mild suprapubic abdominal pressure discomfort.  Denies any other pain to the abdomen, denies chest pain or shortness of breath, no fever, no chills.  No other complaints.  Review of Systems  A complete 10 system review of systems was obtained and all systems are negative except as noted in the HPI and PMH.   Patient's Health History    Past Medical History:  Diagnosis Date  . ANEMIA, PERNICIOUS 09/21/2007   Qualifier: Diagnosis of  By: Zebedee Iba NP, Manuela Schwartz    . Benign Ethnic Leukopenia 04/28/2015  . Dizziness 07/10/2014  . Hip osteoarthritis 09/24/2010  . History of anemia 10/05/2014  . Hypercholesteremia   . HYPERLIPIDEMIA 03/27/2006   Qualifier: Diagnosis of  By: Benna Dunks    . KNEE PAIN, LEFT, CHRONIC 02/02/2009   Qualifier: Diagnosis of  By: Annamary Carolin MD, Amber    . Leukopenia 03/23/2015  . Mixed Alzheimer's and vascular dementia ([place]) 03/23/2015  . MRSA (methicillin resistant staph aureus) culture positive 06/21/2011  . NEUROPATHY, PERIPHERAL 03/27/2006   Qualifier: Diagnosis of  By: Benna Dunks    . OSTEOPENIA 03/27/2006  . Stroke, lacunar ([place]) 04/28/2015  . UTI (urinary tract infection) 03/23/2015  . Vitamin B12 deficiency 04/28/2015  . Vitamin D deficiency 06/13/2011    Past Surgical History:  Procedure Laterality Date  . ABDOMINAL HYSTERECTOMY    . INCISION AND DRAINAGE PERIRECTAL ABSCESS  06/16/2011   Procedure: IRRIGATION AND DEBRIDEMENT PERIRECTAL ABSCESS;  Surgeon: Merrie Roof, MD;  Location: [facility] OR;  Service: General;  Laterality: N/A;    Family  History  Problem Relation Age of Onset  . Kidney disease Mother   . Cancer Sister        colon  . Cancer Brother        lung  . Cancer Sister        stomach  . Dementia Sister   . Dementia Sister   . Dementia Sister   . Dementia Brother     Social History   Socioeconomic History  . Marital status: Legally Separated    Spouse name: Not on file  . Number of children: 2  . Years of education: 9  . Highest education level: Not on file  Occupational History  . Not on file  Social Needs  . Financial resource strain: Not on file  . Food insecurity    Worry: Not on file    Inability: Not on file  . Transportation needs    Medical: Not on file    Non-medical: Not on file  Tobacco Use  . Smoking status: Current Every Day Smoker    Packs/day: 0.50    Types: Cigarettes    Last attempt to quit: 06/15/2011    Years since quitting: 7.3  . Smokeless tobacco: Never Used  Substance and Sexual Activity  . Alcohol use: No  . Drug use: No  . Sexual activity: Not on file  Lifestyle  . Physical activity    Days per week: Not on file    Minutes per session: Not on  file  . Stress: Not on file  Relationships  . Social Musician on phone: Not on file    Gets together: Not on file    Attends religious service: Not on file    Active member of club or organization: Not on file    Attends meetings of clubs or organizations: Not on file    Relationship status: Not on file  . Intimate partner violence    Fear of current or ex partner: Not on file    Emotionally abused: Not on file    Physically abused: Not on file    Forced sexual activity: Not on file  Other Topics Concern  . Not on file  Social History Narrative   Patient is one of 11 children   Pt has Daughter, Fontaine Stavely and and son, Cayetana Hettinger, who is disabled and Wheelchair bound.       Advanced Directives: Code Status: Thomasene Ripple, daughter of patient, is Curator for [employer]       Contact: First and Last Name   Bivens,[name] Daughter [phone]   1st point of contact      [facility] Son [phone]      Current Social History   (Please include date ( . td) when updating information )      Who lives at home: patient lives alone 02/09/2016    Transportation: daughter Mitzi Davenport transports to appointments 02/09/2016   Important Relationships: Daughter and son see above 02/09/2016    Current Stressors: Patient wants to remain in her home and does not want to go to a facility 02/09/2016   Work / Education:  Social Security check of $755 02/09/2016   Other: Patient is able to cook her own breakfast, ambulates on her own and is continent  02/09/2016                                                                                                        Physical Exam  Vital Signs and Nursing Notes reviewed Vitals:   10/07/18 0318 10/07/18 0609  BP: 115/73 118/74  Pulse: 82 71  Resp: 18 14  Temp: 98.6 F (37 C) 97.8 F (36.6 C)  SpO2: 99% 100%    CONSTITUTIONAL: Well-appearing, NAD NEURO:  Alert and oriented x 3, no focal deficits EYES:  eyes equal and reactive ENT/NECK:  no LAD, no JVD CARDIO: Regular rate, well-perfused, normal S1 and S2 PULM:  CTAB no wheezing or rhonchi GI/GU:  normal bowel sounds, non-distended, non-tender MSK/SPINE:  No gross deformities, no edema SKIN:  no rash, atraumatic PSYCH:  Appropriate speech and behavior  Diagnostic and Interventional Summary    Labs Reviewed  COMPREHENSIVE METABOLIC PANEL - Abnormal; Notable for the following components:      Result Value   Glucose, Bld 106 (*)    Total Protein 6.4 (*)    Albumin 3.4 (*)    GFR calc non Af Amer 52 (*)    All other components within normal limits  CBC - Abnormal; Notable for the  following components:   RBC 3.58 (*)    Hemoglobin 11.7 (*)    MCV 100.8 (*)    All other components within normal limits  URINALYSIS, ROUTINE W REFLEX MICROSCOPIC - Abnormal; Notable for the  following components:   APPearance TURBID (*)    Hgb urine dipstick SMALL (*)    Protein, ur >=300 (*)    Leukocytes,Ua MODERATE (*)    WBC, UA >50 (*)    All other components within normal limits  LIPASE, BLOOD    No orders to display    Medications  sodium chloride flush (NS) 0.9 % injection 3 mL (has no administration in time range)     Procedures Critical Care  ED Course and Medical Decision Making  I have reviewed the triage vital signs and the nursing notes.  Pertinent labs & imaging results that were available during my care of the patient were reviewed by me and considered in my medical decision making (see below for details).  Consistent with urinary tract infection, no flank pain to suggest pyelonephritis, no fever, normal vital signs, well-appearing, nothing to suggest systemic infection or sepsis.  Abdominal exam is very reassuring, no tenderness, no rebound, no guarding.  No indication for imaging today.  Labs are reassuring with no leukocytosis, urinalysis is consistent with infection.  Patient has grown Klebsiella pneumonia in the past, seems to be susceptible to Keflex.  Appropriate for discharge.  Elmer SowMichael M. Pilar PlateBero, MD [facility] [email or web address]  Final Clinical Impressions(s) / ED Diagnoses     ICD-10-CM   1. Lower urinary tract infectious disease  N39.0     ED Discharge Orders         Ordered    cephALEXin (KEFLEX) 500 MG capsule  2 times daily     10/07/18 0837          Discharge Instructions Discussed with and Provided to Patient:   Discharge Instructions     You were evaluated in the Emergency Department and after careful evaluation, we did not find any emergent condition requiring admission or further testing in the hospital.  Your exam/testing today was overall reassuring.  Your symptoms seem to be due to a urinary tract infection.  Please take the antibiotics as directed and follow-up with your  regular doctor.  Please return to the Emergency Department if you experience any worsening of your condition.  We encourage you to follow up with a primary care provider.  Thank you for allowing us to be a part of your care.       Sabas SousBero, Caia Lofaro M, MD 10/07/18 [phone]

## 2018-10-07 NOTE — Discharge Instructions (Addendum)
You were evaluated in the Emergency Department and after careful evaluation, we did not find any emergent condition requiring admission or further testing in the hospital.  Your exam/testing today was overall reassuring.  Your symptoms seem to be due to a urinary tract infection.  Please take the antibiotics as directed and follow-up with your regular doctor.  Please return to the Emergency Department if you experience any worsening of your condition.  We encourage you to follow up with a primary care provider.  Thank you for allowing [place] to be a part of your care.

## 2018-10-07 NOTE — ED Triage Notes (Addendum)
To ED for eval of lower abd pain. No nausea, vomiting, or diarrhea. Pt states pain woke her this am and only happens when laying down.

## 2018-10-08 ENCOUNTER — Telehealth: Payer: Self-pay

## 2018-10-08 LAB — URINE CULTURE

## 2018-10-08 NOTE — Telephone Encounter (Signed)
Care Coordination Phone outreach Note Social Work   10/08/2018 Name: Joann [name] MRN: [id number] DOB: Jul 31, 1927  Joann [name] is a 85 y.o. year old female who sees Autry-Lott, [place], DO for primary care. LCSW was consulted for assistance with placement and obtaining an FL2.  Returned called to patient's daughter [name] to assess needs and barriers. Assessment : Patient continues to live home alone.  She refuses personal care assistance services and per daughter is not meeting her hygiene needs. Daughter is asking for an FL2 for assisted living placement for patient. Interventions: LCSW reviewed placement process with daughter, and levels of care, how to locate a facility, Advance directives, health care power of attorney and durable POA.  Other interventions: . Solution-Focused Strategies,   . Supportive Counseling   Plan:  1.  LCSW will discuss FL2 with PCP 2. F/U with daughter in 3 to 4 business days  Dr. Janus Molder has been informed of this outreach and plan.   Casimer Lanius, LCSW Clinical Social Worker [place] / [place]   [phone] 8:44 AM

## 2018-10-08 NOTE — Telephone Encounter (Signed)
Pt daughter, [name] called nurse line. Needs to have an FL2 form filled out so the process can be started to find pt an assisted living facility. Please call the daughter at [phone]. Ottis Stain, CMA

## 2018-10-13 NOTE — Telephone Encounter (Addendum)
Care Coordination Phone outreach Note Social Work   10/13/2018 Name: Joann [name] MRN: [id number] DOB: 1927/10/22  Joann [name] is a 85 y.o. year old female who sees Autry-Lott, [place], DO for primary care. Incoming call from patient's daughter [name].  Assessment : She has updated Advance directives and [place].  Will bring it when she come to office.  She has contacted DSS for Special Assistance application as well as contacted several Assisted Living facilities.  Daughter is calling for update on FL2 status.  DSS and two assisted living facilities would like to see the FL2 to determine if they are able to take patient.  Interventions: Informed daughter that PCP was not in the clinic until Friday.  FL2 has been placed in her mailbox. Plan:  1. LCSW will consult with PCP if needed 2. PCP will review FL2 and talk with patient and daughter as needed 3.   PCP to contact patient and daughter when [facility] is ready for pickup Dr. Janus Molder has been informed of this outreach and plan.  Casimer Lanius, LCSW Clinical Social Worker [place] / [place]   [phone] 2:28 PM

## 2018-10-14 NOTE — Telephone Encounter (Signed)
I am not familiar with this patient or the form. Would you have time to help me with this in clinic this Friday?

## 2018-10-29 ENCOUNTER — Ambulatory Visit (INDEPENDENT_AMBULATORY_CARE_PROVIDER_SITE_OTHER): Payer: Medicare Other

## 2018-10-29 ENCOUNTER — Other Ambulatory Visit: Payer: Self-pay

## 2018-10-29 DIAGNOSIS — D510 Vitamin B12 deficiency anemia due to intrinsic factor deficiency: Secondary | ICD-10-CM | POA: Diagnosis not present

## 2018-10-29 MED ORDER — CYANOCOBALAMIN 1000 MCG/ML IJ SOLN
1000.0000 ug | Freq: Once | INTRAMUSCULAR | Status: AC
  Administered 2018-10-29: 1000 ug via INTRAMUSCULAR

## 2018-10-29 NOTE — Progress Notes (Signed)
Patient presents in for monthly B12 injection. Injection given, RD. Patient tolerated well.

## 2018-10-30 ENCOUNTER — Encounter: Payer: Self-pay | Admitting: Licensed Clinical Social Worker

## 2018-10-30 NOTE — Social Work (Signed)
   Care Coordination  Note Social Work   10/30/2018 Name: Ariyel Jeangilles MRN: [id number] DOB: 02-27-1927  Franci Oshana is a 85 y.o. year old female who sees [place], [place], DO for primary care.  Patient in office today with daughter for nurse clinic visit.  Provided copy of Advance Directives.  Interventions:Copy given to front desk to scan into chart.   Provided patient and daughter with copy of FL2 completed by PCP. Plan:  1. No further follow up required: LCSW  2. Patient and daughter will contact LCSW if additional support is needed.  Casimer Lanius, LCSW Clinical Social Worker [place] / [place]   [phone]

## 2018-11-02 ENCOUNTER — Ambulatory Visit: Payer: Medicare Other | Admitting: Podiatry

## 2018-11-03 ENCOUNTER — Telehealth: Payer: Self-pay | Admitting: Licensed Clinical Social Worker

## 2018-11-03 NOTE — Telephone Encounter (Signed)
   Unsuccessful Phone Outreach Note  11/03/2018 Name: Joann [name] MRN: [id number] DOB: 1927-07-29   Reason for referral : Care Coordination (F/U)   Joann [name] is a 85 y.o. year old female who sees Autry-Lott, [place], DO for primary care.    Check in call to patient's daugher to assess for needs and barriers with ongoing care of patient. Telephone outreach was unsuccessful. A HIPPA compliant phone message was left for the patient providing contact information and requesting a return call.  Plan:  LCSW will wait for return call  Casimer Lanius, [place] / [place]   [phone] 3:56 PM

## 2018-11-04 ENCOUNTER — Other Ambulatory Visit: Payer: Self-pay

## 2018-11-04 DIAGNOSIS — Z20822 Contact with and (suspected) exposure to covid-19: Secondary | ICD-10-CM

## 2018-11-04 NOTE — Telephone Encounter (Addendum)
   Care Coordination Follow up Phone Note Social Work   11/04/2018 Name: Joann [name] MRN: [id number] DOB: 30-Aug-1927  Joann [name] is a 85 y.o. year old female who sees [place], [place], DO for primary care.  Reason for follow-up: received return phone from patient's daughter with update on placement. Patient and daughter had virtual meeting with [facility].  They have scheduled PPD with clinic and will be getting COVID screening today in preparation for placement at [facility].   Plan: Daughter will contact office if additional assistance is needed and will call LCSW once patient is placed.  Casimer Lanius, LCSW Clinical Social Worker [place] / [place]   [phone] 10:34 AM

## 2018-11-06 LAB — NOVEL CORONAVIRUS, NAA: SARS-CoV-2, NAA: NOT DETECTED

## 2018-11-09 ENCOUNTER — Ambulatory Visit (INDEPENDENT_AMBULATORY_CARE_PROVIDER_SITE_OTHER): Payer: Medicare Other

## 2018-11-09 ENCOUNTER — Other Ambulatory Visit: Payer: Self-pay

## 2018-11-09 DIAGNOSIS — Z111 Encounter for screening for respiratory tuberculosis: Secondary | ICD-10-CM | POA: Diagnosis not present

## 2018-11-09 NOTE — Progress Notes (Signed)
Patient presents in nurse clinic for PPD skin test. PPD placed left forearm, wheel present. Patient to return 48-72 hours to have site read. Apt made for 10/14.

## 2018-11-11 ENCOUNTER — Ambulatory Visit (INDEPENDENT_AMBULATORY_CARE_PROVIDER_SITE_OTHER): Payer: Medicare Other | Admitting: *Deleted

## 2018-11-11 ENCOUNTER — Other Ambulatory Visit: Payer: Self-pay

## 2018-11-11 DIAGNOSIS — Z111 Encounter for screening for respiratory tuberculosis: Secondary | ICD-10-CM

## 2018-11-11 LAB — TB SKIN TEST
Induration: 0 mm
TB Skin Test: NEGATIVE

## 2018-11-11 NOTE — Progress Notes (Signed)
Patient is here for a PPD read.  It was placed on 11/09/18 in the Left forearm @ 10:10 am.    PPD RESULTS:  Result: Negative Induration: 0 mm  Letter created and given to patient for documentation purposes. Christen Bame, CMA

## 2018-11-16 ENCOUNTER — Telehealth: Payer: Self-pay | Admitting: *Deleted

## 2018-11-16 DIAGNOSIS — E538 Deficiency of other specified B group vitamins: Secondary | ICD-10-CM

## 2018-11-16 MED ORDER — CYANOCOBALAMIN 1000 MCG/ML IJ SOLN: 1000.0000 ug | INTRAMUSCULAR | 2 refills | Status: DC

## 2018-11-16 MED ORDER — VITAMIN B 12 500 MCG PO TABS: 1000.0000 ug | ORAL_TABLET | Freq: Every day | ORAL | 0 refills | Status: DC

## 2018-11-16 NOTE — Addendum Note (Signed)
Addended by: Caralee Ates on: 11/16/2018 03:38 PM   Modules accepted: Orders

## 2018-11-16 NOTE — Telephone Encounter (Signed)
Pt is going to a nursing home.  They need two things:  - A actual script for her B12  - A copy of her covid test results.  To PCP to create script.  Please return to RN team so that we may call Joann [name] (Daughter)  Christen Bame, CMA

## 2018-11-16 NOTE — Telephone Encounter (Signed)
Script, covid results and TB results placed in to be faxed pile as requested by [facility].  Fax to [phone] Attn: [place]. Christen Bame, CMA

## 2018-11-17 NOTE — Telephone Encounter (Signed)
[name] calls back.  The correct fax number is: [phone]  Will refax now. Christen Bame, CMA

## 2018-11-18 ENCOUNTER — Telehealth: Payer: Self-pay | Admitting: Licensed Clinical Social Worker

## 2018-11-18 NOTE — Telephone Encounter (Signed)
   Care Coordination Phone Note Social Work   11/18/2018 Name: Joann [name] MRN: [id number] DOB: Aug 01, 1927  [name] is a 85 y.o. year old female who sees Autry-Lott, [place], DO for primary care. LCSW received phone call from patient's daughter [name].  Reports patient successfully moved into [place] on Monday 11/16/18.  Patient is adjusting well.  Daughter is very Patent attorney of assistance from LCSW. Plan:  1. Daughter will contact office for ongoing needs for patient 2. No further follow up required: by LCSW at this time   Dr. Janus Molder has been informed of patient's new residency.  Casimer Lanius, LCSW Clinical Social Worker [place] / [place]   [phone] 1:56 PM

## 2018-11-23 DIAGNOSIS — E785 Hyperlipidemia, unspecified: Secondary | ICD-10-CM | POA: Diagnosis not present

## 2018-11-23 DIAGNOSIS — D649 Anemia, unspecified: Secondary | ICD-10-CM | POA: Diagnosis not present

## 2018-12-28 DIAGNOSIS — D519 Vitamin B12 deficiency anemia, unspecified: Secondary | ICD-10-CM | POA: Diagnosis not present

## 2018-12-28 DIAGNOSIS — R609 Edema, unspecified: Secondary | ICD-10-CM | POA: Diagnosis not present

## 2018-12-28 DIAGNOSIS — I10 Essential (primary) hypertension: Secondary | ICD-10-CM | POA: Diagnosis not present

## 2019-01-11 DIAGNOSIS — E785 Hyperlipidemia, unspecified: Secondary | ICD-10-CM | POA: Diagnosis not present

## 2019-01-11 DIAGNOSIS — D649 Anemia, unspecified: Secondary | ICD-10-CM | POA: Diagnosis not present

## 2019-01-11 DIAGNOSIS — R600 Localized edema: Secondary | ICD-10-CM | POA: Diagnosis not present

## 2019-01-15 ENCOUNTER — Encounter (HOSPITAL_COMMUNITY): Payer: Self-pay | Admitting: Emergency Medicine

## 2019-01-15 ENCOUNTER — Emergency Department (HOSPITAL_COMMUNITY): Payer: Medicare Other

## 2019-01-15 ENCOUNTER — Inpatient Hospital Stay (HOSPITAL_COMMUNITY)
Admission: EM | Admit: 2019-01-15 | Discharge: 2019-01-21 | DRG: 536 | Disposition: A | Discharge status: Nursing Home (Includes ICF) | Payer: Medicare Other | Attending: Family Medicine | Admitting: Family Medicine

## 2019-01-15 ENCOUNTER — Other Ambulatory Visit: Payer: Self-pay

## 2019-01-15 DIAGNOSIS — S72002A Fracture of unspecified part of neck of left femur, initial encounter for closed fracture: Secondary | ICD-10-CM

## 2019-01-15 DIAGNOSIS — R600 Localized edema: Secondary | ICD-10-CM | POA: Diagnosis present

## 2019-01-15 DIAGNOSIS — Z20828 Contact with and (suspected) exposure to other viral communicable diseases: Secondary | ICD-10-CM | POA: Diagnosis present

## 2019-01-15 DIAGNOSIS — Z66 Do not resuscitate: Secondary | ICD-10-CM | POA: Diagnosis not present

## 2019-01-15 DIAGNOSIS — S32592D Other specified fracture of left pubis, subsequent encounter for fracture with routine healing: Secondary | ICD-10-CM | POA: Diagnosis not present

## 2019-01-15 DIAGNOSIS — G629 Polyneuropathy, unspecified: Secondary | ICD-10-CM | POA: Diagnosis present

## 2019-01-15 DIAGNOSIS — F0280 Dementia in other diseases classified elsewhere without behavioral disturbance: Secondary | ICD-10-CM | POA: Diagnosis present

## 2019-01-15 DIAGNOSIS — Z8614 Personal history of Methicillin resistant Staphylococcus aureus infection: Secondary | ICD-10-CM | POA: Diagnosis not present

## 2019-01-15 DIAGNOSIS — M810 Age-related osteoporosis without current pathological fracture: Secondary | ICD-10-CM | POA: Diagnosis present

## 2019-01-15 DIAGNOSIS — E875 Hyperkalemia: Secondary | ICD-10-CM | POA: Diagnosis present

## 2019-01-15 DIAGNOSIS — I878 Other specified disorders of veins: Secondary | ICD-10-CM | POA: Diagnosis present

## 2019-01-15 DIAGNOSIS — Z882 Allergy status to sulfonamides status: Secondary | ICD-10-CM

## 2019-01-15 DIAGNOSIS — E785 Hyperlipidemia, unspecified: Secondary | ICD-10-CM | POA: Diagnosis present

## 2019-01-15 DIAGNOSIS — E538 Deficiency of other specified B group vitamins: Secondary | ICD-10-CM

## 2019-01-15 DIAGNOSIS — Z03818 Encounter for observation for suspected exposure to other biological agents ruled out: Secondary | ICD-10-CM | POA: Diagnosis not present

## 2019-01-15 DIAGNOSIS — G309 Alzheimer's disease, unspecified: Secondary | ICD-10-CM | POA: Diagnosis not present

## 2019-01-15 DIAGNOSIS — R609 Edema, unspecified: Secondary | ICD-10-CM | POA: Diagnosis not present

## 2019-01-15 DIAGNOSIS — S32592A Other specified fracture of left pubis, initial encounter for closed fracture: Secondary | ICD-10-CM | POA: Diagnosis not present

## 2019-01-15 DIAGNOSIS — W050XXA Fall from non-moving wheelchair, initial encounter: Secondary | ICD-10-CM | POA: Diagnosis present

## 2019-01-15 DIAGNOSIS — D510 Vitamin B12 deficiency anemia due to intrinsic factor deficiency: Secondary | ICD-10-CM | POA: Diagnosis not present

## 2019-01-15 DIAGNOSIS — Z79899 Other long term (current) drug therapy: Secondary | ICD-10-CM | POA: Diagnosis not present

## 2019-01-15 DIAGNOSIS — F17210 Nicotine dependence, cigarettes, uncomplicated: Secondary | ICD-10-CM | POA: Diagnosis present

## 2019-01-15 DIAGNOSIS — I959 Hypotension, unspecified: Secondary | ICD-10-CM | POA: Diagnosis not present

## 2019-01-15 DIAGNOSIS — R279 Unspecified lack of coordination: Secondary | ICD-10-CM | POA: Diagnosis not present

## 2019-01-15 DIAGNOSIS — E559 Vitamin D deficiency, unspecified: Secondary | ICD-10-CM | POA: Diagnosis not present

## 2019-01-15 DIAGNOSIS — D61818 Other pancytopenia: Secondary | ICD-10-CM | POA: Diagnosis present

## 2019-01-15 DIAGNOSIS — R52 Pain, unspecified: Secondary | ICD-10-CM | POA: Diagnosis not present

## 2019-01-15 DIAGNOSIS — F0150 Vascular dementia without behavioral disturbance: Secondary | ICD-10-CM | POA: Diagnosis present

## 2019-01-15 DIAGNOSIS — E440 Moderate protein-calorie malnutrition: Secondary | ICD-10-CM | POA: Insufficient documentation

## 2019-01-15 DIAGNOSIS — S32502A Unspecified fracture of left pubis, initial encounter for closed fracture: Secondary | ICD-10-CM | POA: Diagnosis not present

## 2019-01-15 DIAGNOSIS — S32512A Fracture of superior rim of left pubis, initial encounter for closed fracture: Secondary | ICD-10-CM | POA: Diagnosis not present

## 2019-01-15 DIAGNOSIS — S79912A Unspecified injury of left hip, initial encounter: Secondary | ICD-10-CM | POA: Diagnosis not present

## 2019-01-15 DIAGNOSIS — F17200 Nicotine dependence, unspecified, uncomplicated: Secondary | ICD-10-CM

## 2019-01-15 DIAGNOSIS — Z743 Need for continuous supervision: Secondary | ICD-10-CM | POA: Diagnosis not present

## 2019-01-15 DIAGNOSIS — Z90710 Acquired absence of both cervix and uterus: Secondary | ICD-10-CM

## 2019-01-15 DIAGNOSIS — Z8673 Personal history of transient ischemic attack (TIA), and cerebral infarction without residual deficits: Secondary | ICD-10-CM | POA: Diagnosis not present

## 2019-01-15 LAB — CBC WITH DIFFERENTIAL/PLATELET
Abs Immature Granulocytes: 0.06 10*3/uL (ref 0.00–0.07)
Basophils Absolute: 0 10*3/uL (ref 0.0–0.1)
Basophils Relative: 0 %
Eosinophils Absolute: 0 10*3/uL (ref 0.0–0.5)
Eosinophils Relative: 0 %
HCT: 42.3 % (ref 36.0–46.0)
Hemoglobin: 13.4 g/dL (ref 12.0–15.0)
Immature Granulocytes: 1 %
Lymphocytes Relative: 10 %
Lymphs Abs: 0.6 10*3/uL — ABNORMAL LOW (ref 0.7–4.0)
MCH: 28 pg (ref 26.0–34.0)
MCHC: 31.7 g/dL (ref 30.0–36.0)
MCV: 88.3 fL (ref 80.0–100.0)
Monocytes Absolute: 0.7 10*3/uL (ref 0.1–1.0)
Monocytes Relative: 11 %
Neutro Abs: 5.2 10*3/uL (ref 1.7–7.7)
Neutrophils Relative %: 78 %
Platelets: 239 10*3/uL (ref 150–400)
RBC: 4.79 MIL/uL (ref 3.87–5.11)
RDW: 12.4 % (ref 11.5–15.5)
WBC: 6.6 10*3/uL (ref 4.0–10.5)
nRBC: 0 % (ref 0.0–0.2)

## 2019-01-15 NOTE — ED Notes (Signed)
Rogelia Mire (Daughter#[phone]) called for an update.  Thank you

## 2019-01-15 NOTE — ED Triage Notes (Signed)
Per [facility] pt coming from [place] facility after falling from wheelchair to ground yesterday. C/o left hip/flank pain. EMS state staff have had patient in wheelchair for past few days due to edema.

## 2019-01-15 NOTE — ED Notes (Signed)
[phone] [place] at facility is looking for an update on the patient

## 2019-01-15 NOTE — ED Notes (Signed)
Joann [name] daughter [phone] [place] nurse at facility both are looking for an update on the patient

## 2019-01-15 NOTE — ED Provider Notes (Signed)
[facility] EMERGENCY DEPARTMENT Provider Note   CSN: [id number] Arrival date & time: 01/15/19  1521     History Chief Complaint  Patient presents with  . Hip Pain    Joann [name] is a 85 y.o. female with a history of Alzheimer's and vascular dementia, osteoporosis, hyperlipidemia, presenting to the ED with mechanical fall and pain in her left hip.  The patient is a very poor historian, but additional history was provided by her nurse Lanora Manis from [place] [facility].  Lanora Manis tells me that the patient had a mechanical fall yesterday, lost her balance and fell directly on her butt.  She says the patient has had difficulty ambulating since then.  She has basically only been able to walk with 2 person assistant.  Normally the patient is able to ambulate on her own without use of cane or walker.  However for the past 24 hours she has been more or less wheelchair-bound.  Patient complains of pain in her left hip only with weightbearing.  While she is lying on the bed she has no acute complaints.  She has no numbness or weakness in her legs.  There is no report of head trauma per the nursing facility.  Patient denies a headache.    HPI     Past Medical History:  Diagnosis Date  . ANEMIA, PERNICIOUS 09/21/2007   Qualifier: Diagnosis of  By: Humberto Seals NP, Darl Pikes    . Benign Ethnic Leukopenia 04/28/2015  . Dizziness 07/10/2014  . Hip osteoarthritis 09/24/2010  . History of anemia 10/05/2014  . Hypercholesteremia   . HYPERLIPIDEMIA 03/27/2006   Qualifier: Diagnosis of  By: Levada Schilling    . KNEE PAIN, LEFT, CHRONIC 02/02/2009   Qualifier: Diagnosis of  By: Clotilde Dieter MD, Amber    . Leukopenia 03/23/2015  . Mixed Alzheimer's and vascular dementia (HCC) 03/23/2015  . MRSA (methicillin resistant staph aureus) culture positive 06/21/2011  . NEUROPATHY, PERIPHERAL 03/27/2006   Qualifier: Diagnosis of  By: Levada Schilling    . OSTEOPENIA 03/27/2006  . Stroke, lacunar (HCC)  04/28/2015  . UTI (urinary tract infection) 03/23/2015  . Vitamin B12 deficiency 04/28/2015  . Vitamin D deficiency 06/13/2011    Patient Active Problem List   Diagnosis Date Noted  . Closed fracture of left inferior pubic ramus (HCC) 01/16/2019  . Anemia 09/01/2018  . Increased sleeping 08/26/2018  . Swollen ankles 11/09/2015  . Vitamin B12 deficiency 04/28/2015  . Benign Ethnic Leukopenia 04/28/2015  . Stroke, lacunar (HCC) 04/28/2015  . Mixed Alzheimer's and vascular dementia (HCC) 03/23/2015  . Memory loss of unknown cause 10/05/2014  . Vitamin D deficiency 06/13/2011  . KNEE PAIN, LEFT, CHRONIC 02/02/2009  . ANEMIA, PERNICIOUS 09/21/2007  . HYPERLIPIDEMIA 03/27/2006  . TOBACCO DEPENDENCE 03/27/2006  . NEUROPATHY, PERIPHERAL 03/27/2006  . OSTEOPENIA 03/27/2006    Past Surgical History:  Procedure Laterality Date  . ABDOMINAL HYSTERECTOMY    . INCISION AND DRAINAGE PERIRECTAL ABSCESS  06/16/2011   Procedure: IRRIGATION AND DEBRIDEMENT PERIRECTAL ABSCESS;  Surgeon: Robyne Askew, MD;  Location: [facility] OR;  Service: General;  Laterality: N/A;     OB History   No obstetric history on file.     Family History  Problem Relation Age of Onset  . Kidney disease Mother   . Cancer Sister        colon  . Cancer Brother        lung  . Cancer Sister  stomach  . Dementia Sister   . Dementia Sister   . Dementia Sister   . Dementia Brother     Social History   Tobacco Use  . Smoking status: Current Every Day Smoker    Packs/day: 0.50    Types: Cigarettes    Last attempt to quit: 06/15/2011    Years since quitting: 7.5  . Smokeless tobacco: Never Used  Substance Use Topics  . Alcohol use: No  . Drug use: No    Home Medications Prior to Admission medications   Medication Sig Start Date End Date Taking? Authorizing Provider  acetaminophen (TYLENOL) 500 MG tablet Take 500 mg by mouth every 6 (six) hours as needed (for a fever of 99.5-101 F and monitor until normal  or for headaches & minor discomfort- CANNOT EXCEED 2,000 MG/24 HOURS).   Yes [provider]  alum & mag hydroxide-simeth (MI-ACID) 200-200-20 MG/5ML suspension Take 30 mLs by mouth every 6 (six) hours as needed for indigestion or heartburn (NOT TO EXCEED 4 DOSES/24 HOURS).   Yes [provider]  divalproex (DEPAKOTE SPRINKLE) 125 MG capsule Take 125 mg by mouth See admin instructions. Take 125 mg by mouth three times a day- 8 AM, 1 PM, and 8 PM   Yes [provider]  guaifenesin (ROBAFEN) 100 MG/5ML syrup Take 200 mg by mouth every 6 (six) hours as needed for cough (NOT TO EXCEED 4 DOSES/24 HOURS).   Yes [provider]  loperamide (IMODIUM) 2 MG capsule Take 2 mg by mouth as needed (with each loose stool or diarrhea- cannot exceed 8 doses/24 hours).   Yes [provider]  magnesium hydroxide (MILK OF MAGNESIA) 400 MG/5ML suspension Take 30 mLs by mouth at bedtime as needed for mild constipation.   Yes [provider]  Neomycin-Bacitracin-Polymyxin (TRIPLE ANTIBIOTIC) [phone] OINT Apply 1 application topically See admin instructions. Apply to affected area(s) after cleaning with normal saline, then cover with gauze and tape or Band-Aid(s)- until healed   Yes [provider]  OLANZapine (ZYPREXA) 2.5 MG tablet Take 2.5 mg by mouth every morning.   Yes [provider]  cyanocobalamin (,VITAMIN B-12,) 1000 MCG/ML injection Inject 1 mL (1,000 mcg total) into the muscle every 30 (thirty) days. Patient not taking: Reported on 01/16/2019 11/16/18   Autry-Lott, Randa Evens, DO    Allergies    Sulfonamide derivatives  Review of Systems   Review of Systems  Constitutional: Negative for chills and fever.  Respiratory: Negative for cough and shortness of breath.   Cardiovascular: Negative for chest pain and palpitations.  Gastrointestinal: Negative for nausea and vomiting.  Musculoskeletal: Positive for arthralgias and myalgias.    Skin: Negative for pallor and rash.  Neurological: Negative for syncope and light-headedness.  All other systems reviewed and are negative.   Physical Exam Updated Vital Signs BP (!) 156/75   Pulse 74   Temp 99.3 F (37.4 C) (Oral)   Resp 16   SpO2 97%   Physical Exam Vitals and nursing note reviewed.  Constitutional:      General: She is not in acute distress.    Appearance: She is well-developed.  HENT:     Head: Normocephalic and atraumatic.  Eyes:     Conjunctiva/sclera: Conjunctivae normal.     Pupils: Pupils are equal, round, and reactive to light.  Cardiovascular:     Rate and Rhythm: Normal rate and regular rhythm.     Pulses: Normal pulses.  Pulmonary:     Effort: Pulmonary  effort is normal. No respiratory distress.  Abdominal:     Palpations: Abdomen is soft.     Tenderness: There is no abdominal tenderness.  Musculoskeletal:     Cervical back: Neck supple.     Comments: Full ROM at the bilateral hips Pain in left hip with attempted weight bearing Unable to bear full weight due to pain Pelvis stable on exam  Skin:    General: Skin is warm and dry.  Neurological:     General: No focal deficit present.     Mental Status: She is alert.     Sensory: No sensory deficit.     ED Results / Procedures / Treatments   Labs (all labs ordered are listed, but only abnormal results are displayed) Labs Reviewed  BASIC METABOLIC PANEL - Abnormal; Notable for the following components:      Result Value   Potassium 5.2 (*)    Glucose, Bld 129 (*)    GFR calc non Af Amer 59 (*)    All other components within normal limits  CBC WITH DIFFERENTIAL/PLATELET - Abnormal; Notable for the following components:   Lymphs Abs 0.6 (*)    All other components within normal limits  URINALYSIS, ROUTINE W REFLEX MICROSCOPIC - Abnormal; Notable for the following components:   APPearance HAZY (*)    Hgb urine dipstick TRACE (*)    Protein, ur 30 (*)    Leukocytes,Ua SMALL (*)     All other components within normal limits  URINALYSIS, MICROSCOPIC (REFLEX) - Abnormal; Notable for the following components:   Bacteria, UA FEW (*)    All other components within normal limits  SARS CORONAVIRUS 2 (TAT 6-24 HRS)    EKG None  Radiology MR HIP LEFT WO CONTRAST  Result Date: 01/16/2019 CLINICAL DATA:  Left hip pain after fall yesterday. EXAM: MR OF THE LEFT HIP WITHOUT CONTRAST TECHNIQUE: Multiplanar, multisequence MR imaging was performed. No intravenous contrast was administered. COMPARISON:  Left hip x-rays from yesterday. FINDINGS: Bones: Acute nondisplaced fracture of the left inferior pubic ramus. Contusion in the left superior pubic ramus without discrete fracture line. No femur fracture. No dislocation or avascular necrosis. No focal bone lesion. Mild degenerative changes of the pubic symphysis and bilateral sacroiliac joints. Articular cartilage and labrum Articular cartilage: Diffuse mild partial-thickness cartilage loss in both hip joints with areas of full-thickness cartilage loss over the superolateral joint space. Labrum: Degenerated but grossly intact. Joint or bursal effusion Joint effusion: Trace bilateral hip joint effusions. Bursae: No focal periarticular fluid collection. Muscles and tendons Muscles and tendons: The visualized gluteus, hamstring and iliopsoas tendons are intact. Prominent edema in the left proximal adductor and obturator externus muscles. Other findings Miscellaneous: Prior hysterectomy. The visualized internal pelvic contents otherwise appear unremarkable. IMPRESSION: 1. Acute nondisplaced fracture of the left inferior pubic ramus. Contusion in the left superior pubic ramus without discrete fracture. 2. Moderate strain of the left proximal adductor and obturator externus muscles. 3. Mild bilateral hip osteoarthritis. Electronically Signed   By: Obie DredgeWilliam T Derry M.D.   On: 01/16/2019 07:28   DG Hip Unilat With Pelvis 2-3 Views Left  Result Date:  01/15/2019 CLINICAL DATA:  Fall. EXAM: DG HIP (WITH OR WITHOUT PELVIS) 2-3V LEFT COMPARISON:  CT 06/16/2011. FINDINGS: Diffuse osteopenia. Degenerative change lumbar spine and both hips. No acute abnormality identified. No evidence of fracture dislocation. Pelvic calcifications consistent phleboliths. IMPRESSION: Diffuse osteopenia degenerative change. No acute abnormality identified. Electronically Signed   By: Maisie Fushomas  Register   On:  01/15/2019 16:08    Procedures Procedures (including critical care time)  Medications Ordered in ED Medications - No data to display  ED Course  I have reviewed the triage vital signs and the nursing notes.  Pertinent labs & imaging results that were available during my care of the patient were reviewed by me and considered in my medical decision making (see chart for details).  This is an 85 year old female presenting to the ED with mechanical fall and left hip pain, inability to bear weight for the past 2 days.  Prior to this patient had been ambulatory and able to bear weight fully without use of a cane or walker.  Initial x-rays were negative.  Plan obtain an MR image of the hip to evaluate for occult fracture.  Also plan to  obtain basic labs and anticipate possible admission for physical therapy assessment.  Clinical Course as of Jan 15 1122  Fri Jan 15, 2019  2346 Updated the patient's daughter by phone.   [MT]    Clinical Course User Index [MT] Eliska Hamil, Carola Rhine, MD   MDM Rules/Calculators/A&P                       Final Clinical Impression(s) / ED Diagnoses Final diagnoses:  Closed fracture of left hip, initial encounter [facility])  Pubic ramus fracture, left, closed, initial encounter [facility])    Rx / DC Orders ED Discharge Orders    None       Wyvonnia Dusky, MD 01/16/19 1123

## 2019-01-16 ENCOUNTER — Emergency Department (HOSPITAL_COMMUNITY): Payer: Medicare Other

## 2019-01-16 DIAGNOSIS — Z743 Need for continuous supervision: Secondary | ICD-10-CM | POA: Diagnosis not present

## 2019-01-16 DIAGNOSIS — S32592A Other specified fracture of left pubis, initial encounter for closed fracture: Secondary | ICD-10-CM

## 2019-01-16 DIAGNOSIS — E875 Hyperkalemia: Secondary | ICD-10-CM

## 2019-01-16 DIAGNOSIS — R600 Localized edema: Secondary | ICD-10-CM

## 2019-01-16 DIAGNOSIS — G309 Alzheimer's disease, unspecified: Secondary | ICD-10-CM | POA: Diagnosis present

## 2019-01-16 DIAGNOSIS — Z8614 Personal history of Methicillin resistant Staphylococcus aureus infection: Secondary | ICD-10-CM | POA: Diagnosis not present

## 2019-01-16 DIAGNOSIS — Z66 Do not resuscitate: Secondary | ICD-10-CM | POA: Diagnosis not present

## 2019-01-16 DIAGNOSIS — F17210 Nicotine dependence, cigarettes, uncomplicated: Secondary | ICD-10-CM | POA: Diagnosis present

## 2019-01-16 DIAGNOSIS — I878 Other specified disorders of veins: Secondary | ICD-10-CM | POA: Diagnosis present

## 2019-01-16 DIAGNOSIS — S32512A Fracture of superior rim of left pubis, initial encounter for closed fracture: Secondary | ICD-10-CM | POA: Diagnosis not present

## 2019-01-16 DIAGNOSIS — R52 Pain, unspecified: Secondary | ICD-10-CM | POA: Diagnosis not present

## 2019-01-16 DIAGNOSIS — F0150 Vascular dementia without behavioral disturbance: Secondary | ICD-10-CM | POA: Diagnosis present

## 2019-01-16 DIAGNOSIS — D61818 Other pancytopenia: Secondary | ICD-10-CM | POA: Diagnosis not present

## 2019-01-16 DIAGNOSIS — Z20828 Contact with and (suspected) exposure to other viral communicable diseases: Secondary | ICD-10-CM | POA: Diagnosis present

## 2019-01-16 DIAGNOSIS — Z8673 Personal history of transient ischemic attack (TIA), and cerebral infarction without residual deficits: Secondary | ICD-10-CM | POA: Diagnosis not present

## 2019-01-16 DIAGNOSIS — D510 Vitamin B12 deficiency anemia due to intrinsic factor deficiency: Secondary | ICD-10-CM | POA: Diagnosis present

## 2019-01-16 DIAGNOSIS — E559 Vitamin D deficiency, unspecified: Secondary | ICD-10-CM | POA: Diagnosis not present

## 2019-01-16 DIAGNOSIS — M810 Age-related osteoporosis without current pathological fracture: Secondary | ICD-10-CM | POA: Diagnosis present

## 2019-01-16 DIAGNOSIS — G629 Polyneuropathy, unspecified: Secondary | ICD-10-CM | POA: Diagnosis present

## 2019-01-16 DIAGNOSIS — Z79899 Other long term (current) drug therapy: Secondary | ICD-10-CM | POA: Diagnosis not present

## 2019-01-16 DIAGNOSIS — E785 Hyperlipidemia, unspecified: Secondary | ICD-10-CM | POA: Diagnosis not present

## 2019-01-16 DIAGNOSIS — Z882 Allergy status to sulfonamides status: Secondary | ICD-10-CM | POA: Diagnosis not present

## 2019-01-16 DIAGNOSIS — I959 Hypotension, unspecified: Secondary | ICD-10-CM | POA: Diagnosis not present

## 2019-01-16 DIAGNOSIS — R279 Unspecified lack of coordination: Secondary | ICD-10-CM | POA: Diagnosis not present

## 2019-01-16 DIAGNOSIS — W050XXA Fall from non-moving wheelchair, initial encounter: Secondary | ICD-10-CM | POA: Diagnosis present

## 2019-01-16 DIAGNOSIS — Z90710 Acquired absence of both cervix and uterus: Secondary | ICD-10-CM | POA: Diagnosis not present

## 2019-01-16 DIAGNOSIS — F0280 Dementia in other diseases classified elsewhere without behavioral disturbance: Secondary | ICD-10-CM | POA: Diagnosis present

## 2019-01-16 DIAGNOSIS — S32592D Other specified fracture of left pubis, subsequent encounter for fracture with routine healing: Secondary | ICD-10-CM | POA: Diagnosis not present

## 2019-01-16 HISTORY — DX: Other specified fracture of left pubis, initial encounter for closed fracture: S32.592A

## 2019-01-16 LAB — BASIC METABOLIC PANEL
Anion gap: 10 (ref 5–15)
BUN: 16 mg/dL (ref 8–23)
CO2: 23 mmol/L (ref 22–32)
Calcium: 9.1 mg/dL (ref 8.9–10.3)
Chloride: 104 mmol/L (ref 98–111)
Creatinine, Ser: 0.87 mg/dL (ref 0.44–1.00)
GFR calc Af Amer: >60 mL/min (ref >60)
GFR calc non Af Amer: 59 mL/min — ABNORMAL LOW (ref >60)
Glucose, Bld: 129 mg/dL — ABNORMAL HIGH (ref 70–99)
Potassium: 5.2 mmol/L — ABNORMAL HIGH (ref 3.5–5.1)
Sodium: 137 mmol/L (ref 135–145)

## 2019-01-16 LAB — URINALYSIS, MICROSCOPIC (REFLEX)

## 2019-01-16 LAB — PROTEIN / CREATININE RATIO, URINE
Creatinine, Urine: 78.99 mg/dL
Protein Creatinine Ratio: 0.7 mg/mg{Cre} — ABNORMAL HIGH (ref 0.00–0.15)
Total Protein, Urine: 55 mg/dL

## 2019-01-16 LAB — URINALYSIS, ROUTINE W REFLEX MICROSCOPIC
Bilirubin Urine: NEGATIVE
Glucose, UA: NEGATIVE mg/dL
Ketones, ur: NEGATIVE mg/dL
Nitrite: NEGATIVE
Protein, ur: 30 mg/dL — AB
Specific Gravity, Urine: 1.015 (ref 1.005–1.030)
pH: 7 (ref 5.0–8.0)

## 2019-01-16 LAB — BRAIN NATRIURETIC PEPTIDE: B Natriuretic Peptide: 103.5 pg/mL — ABNORMAL HIGH (ref 0.0–100.0)

## 2019-01-16 LAB — SARS CORONAVIRUS 2 (TAT 6-24 HRS): SARS Coronavirus 2: NEGATIVE

## 2019-01-16 MED ORDER — MORPHINE SULFATE (PF) 2 MG/ML IV SOLN
1.0000 mg | Freq: Three times a day (TID) | INTRAVENOUS | Status: DC | PRN
  Administered 2019-01-17: 1 mg via INTRAVENOUS
  Filled 2019-01-16: qty 1

## 2019-01-16 MED ORDER — LOPERAMIDE HCL 2 MG PO CAPS: 2.0000 mg | ORAL_CAPSULE | ORAL | Status: DC | PRN

## 2019-01-16 MED ORDER — ACETAMINOPHEN 500 MG PO TABS: 500.0000 mg | ORAL_TABLET | Freq: Four times a day (QID) | ORAL | Status: DC | PRN

## 2019-01-16 MED ORDER — ENOXAPARIN SODIUM 40 MG/0.4ML ~~LOC~~ SOLN
40.0000 mg | SUBCUTANEOUS | Status: DC
  Administered 2019-01-16 – 2019-01-21 (×6): 40 mg via SUBCUTANEOUS
  Filled 2019-01-16 (×6): qty 0.4

## 2019-01-16 MED ORDER — ALUM & MAG HYDROXIDE-SIMETH 200-200-20 MG/5ML PO SUSP: 30.0000 mL | Freq: Four times a day (QID) | ORAL | Status: DC | PRN

## 2019-01-16 MED ORDER — OLANZAPINE 5 MG PO TABS
2.5000 mg | ORAL_TABLET | Freq: Every day | ORAL | Status: DC
  Administered 2019-01-16 – 2019-01-19 (×4): 2.5 mg via ORAL
  Filled 2019-01-16 (×5): qty 1

## 2019-01-16 MED ORDER — MAGNESIUM HYDROXIDE 400 MG/5ML PO SUSP: 30.0000 mL | Freq: Every evening | ORAL | Status: DC | PRN

## 2019-01-16 MED ORDER — DIVALPROEX SODIUM 125 MG PO CSDR
125.0000 mg | DELAYED_RELEASE_CAPSULE | Freq: Three times a day (TID) | ORAL | Status: DC
  Administered 2019-01-16 – 2019-01-19 (×9): 125 mg via ORAL
  Filled 2019-01-16 (×11): qty 1

## 2019-01-16 NOTE — ED Provider Notes (Signed)
12:24 AM  Patient signed out pending MRI hip.  Patient with fall and now unable to bear weight.  X-rays negative.  Reported mechanical fall.  Patient may ultimately need admission for PT even if MRI is negative given inability to bear weight.  6:51 AM  Have requested multiple times to the evening for the patient to be prioritized with MRI.  Per MRI tech, there is only 1 tech available for imaging tonight.  On multiple assessments, patient appears comfortable.  Lab work reviewed.  Mild hyperkalemia at 5.2.  No AKI.  Will reassess after MRI.   Merryl Hacker, MD 01/16/19 [phone]

## 2019-01-16 NOTE — ED Notes (Signed)
Updated Pts family and Benjamine Mola, Therapist, sports at [facility]

## 2019-01-16 NOTE — Progress Notes (Signed)
Spoke with daughter, Mariabelen Pressly, patient's documented POA.  She notes that her mother's wishes are to be a Do Not Resuscitate.  Order placed for DNR.  [place] Constable, D.O.  PGY-2 Family Medicine  01/16/2019 2:04 PM

## 2019-01-16 NOTE — H&P (Addendum)
Family Medicine Teaching [facility] Admission History and Physical Service Pager: [phone]  Patient name: Joann [name] Medical record number: [id number] Date of birth: [birth year] Age: 85 y.o. Gender: female  Primary Care Provider: Lavonda Jumbo, DO Consultants: Orthopedics Code Status: DNR, confirmed with daughter on admission Preferred Emergency Contact: Gizelle Whetsel, daughter, [place] [phone]  Chief Complaint: Fall  Assessment and Plan: Joann [name] is a 85 y.o. female presenting after a fall.  Patient also has lower extremity swelling. PMH is significant for vitamin B12 deficiency, Alzheimer's and vascular dementia, vitamin D deficiency, HLD, tobacco dependence, and osteopenia  Fall  Left Inferior Pubic Ramus Fracture Patient presented to the emergency room after a fall on 12/17 at her assisted living facility while coming in from smoking a cigarette, patient states she was walking, ALF states she was in wheelchair.  ALF reports she was placed in wheelchair 2-3 days prior to fall 2/2 bilateral feet swelling.  After the fall, she continued to complain of left leg pain, and was brought to ED for evaluation on 12/18.  X-ray showed diffuse osteopenia degenerative change but no acute abnormality.  An MRI of hip showed acute nondisplaced fracture of the left inferior pubic ramus and a contusion in the left superior pubic ramus without discrete fracture.  Also showed moderate strain of the left proximal adductor and obturator externus muscles.  The most obvious cause of the fall would be that it was mechanical and that she tripped while trying to get through the door in her wheelchair.  Other differentials include syncope or seizure but these are unlikely.  Patient reports she remembers the whole event and gave a relatively accurate description of how the events transpired.  She denies any loss of consciousness or hitting her head.  Orthopedics was consulted and gave recommendations, including  WBAT, mobilize with PT, non-operative fracture.  They also wanted office follow-up in 2-4 weeks.  Spoke with Daughter who is concerned that ALF will not be able to provide the care her mother needs for the long-term and is interested in pursuing SNF if recommended. -Admit to inpatient MedSurg with Dr. Manson Passey as the attending -Vitals per floor routine -Ortho consulted recommendations appreciated, have signed off -Weightbearing as tolerated bilaterally -Mobilize with PT as pain allows - ortho outpatient follow up in 2-4 weeks -Tylenol every 6 hours as needed for pain -IV morphine 1 mg q8h as needed for severe pain -PT/OT eval and treat -Continue Depakote Sprinkle -Up with assistance - EKG, given hyperkalemia and fall with unclear etiology - regular diet - lovenox for DVT PPx, as Hgb stable and no active bleeding noted  Lower extremity edema Patient has new onset lower extremity edema bilaterally.  Patient denies history of this although assisted living facility says that this has been going on for the past few days which is why she was initially placed in a wheelchair.  On exam patient has +2 lower extremity edema bilaterally extending to ankle.  Differential includes chronic venous insufficiency vs new onset congestive heart failure vs DVT.  Patient has no history of heart failure and has no echocardiograms in our system.  Patient has history of bilateral lower extremity swelling in 2013 which resulted in bilateral lower extremity Dopplers which were negative for deep vein thrombosis.  DVT would also be less likely given bilateral.  Wells score for DVT -2.  Given patient's clinical presentation I feel chronic venous stasis is most likely cause but without echo we cannot rule out heart failure.  Would hold  off on Echo at this time, given lack of respiratory complaints at present, but could consider if BNP is significantly elevated. -Monitor respiratory status -Follow-up on BNP -Consider  echocardiogram - compression stockings  Hyperkalemia Patient's potassium on admission was 5.2.  Patient is on no diuretics that would cause hyperkalemia.  Per chart review, she has not been hyperkalemic previously.  She does have some weakness, but suspect this is related to her age and pain, not hyperkalemia.  Will obtain EKG to assess for peaked T waves and f/u AM BMP.   Creatinine on admission was 0.87. -Morning BMPs -EKG  Alzheimer's/vascular dementia Patient was diagnosed with dementia in 2012 and the family did not wish to pursue treatment at that time.  Patient was never started on Namenda.  Patient is currently living in ALF but family is concerned for need for SNF placement.  On admission is A&Ox3, not to year.  Daughter notes that she waxes and wanes and occasionally is "much worse." -Delirium precautions -Monitor mentation  FEN/GI: Regular diet Prophylaxis: Lovenox  Disposition: Admit to inpatient FMTS, likely d/c SNF  History of Present Illness:  Joann [name] is a 85 y.o. female presenting after a fall at her assisted living facility, [facility].    Patient reports that she was on her way back from smoking outside when she fell.  She is not sure why she fell and she does not think she tripped on anything. She denies any loss of consciousness or syncope and says that she remembers the entire event.  She says that the day after she fell she was having pain in her left leg and was put in a wheelchair.  She reports that she is currently having pain in her left leg and the she usually does not have swelling in her legs.  Patient denies any headaches,, dizziness, changes in vision, changes in hearing, she says she has occasional tinnitus.  Denies cough, congestion, shortness of breath, fever, diarrhea, constipation, change in urination, pain with urination.  Her daughter reports the fall happened while she was coming back from smoking on Thursday 12/17.  She says that the patient has  had worsening lower extremity edema recently and that is why she believes the nursing facility placed her in a wheelchair.   The assisted living facility was also contacted and they reported that the patient has been in a wheelchair for the past 2 to 3 days because of swelling in her lower extremities.  They reported that she fell while coming back from smoking while trying to get through the door in her wheelchair.  Review Of Systems: Per HPI with the following additions:  Review of Systems  Constitutional: Negative for chills and fever.  HENT: Positive for tinnitus (occasional, chronic). Negative for congestion and sore throat.   Eyes: Negative for blurred vision.  Respiratory: Negative for cough and shortness of breath.   Cardiovascular: Negative for chest pain and leg swelling.  Gastrointestinal: Negative for abdominal pain, constipation and diarrhea.  Genitourinary: Negative for dysuria, frequency and urgency.  Musculoskeletal: Positive for falls.  Neurological: Negative for dizziness and headaches.    Patient Active Problem List   Diagnosis Date Noted  . Closed fracture of left inferior pubic ramus ([place]) 01/16/2019  . Anemia 09/01/2018  . Increased sleeping 08/26/2018  . Swollen ankles 11/09/2015  . Vitamin B12 deficiency 04/28/2015  . Benign Ethnic Leukopenia 04/28/2015  . Stroke, lacunar ([place]) 04/28/2015  . Mixed Alzheimer's and vascular dementia ([place]) 03/23/2015  . Memory  loss of unknown cause 10/05/2014  . Vitamin D deficiency 06/13/2011  . KNEE PAIN, LEFT, CHRONIC 02/02/2009  . ANEMIA, PERNICIOUS 09/21/2007  . HYPERLIPIDEMIA 03/27/2006  . TOBACCO DEPENDENCE 03/27/2006  . NEUROPATHY, PERIPHERAL 03/27/2006  . OSTEOPENIA 03/27/2006    Past Medical History: Past Medical History:  Diagnosis Date  . ANEMIA, PERNICIOUS 09/21/2007   Qualifier: Diagnosis of  By: Humberto Seals NP, Darl Pikes    . Benign Ethnic Leukopenia 04/28/2015  . Dizziness 07/10/2014  . Hip osteoarthritis 09/24/2010   . History of anemia 10/05/2014  . Hypercholesteremia   . HYPERLIPIDEMIA 03/27/2006   Qualifier: Diagnosis of  By: Levada Schilling    . KNEE PAIN, LEFT, CHRONIC 02/02/2009   Qualifier: Diagnosis of  By: Clotilde Dieter MD, [place]    . Leukopenia 03/23/2015  . Mixed Alzheimer's and vascular dementia ([facility]) 03/23/2015  . MRSA (methicillin resistant staph aureus) culture positive 06/21/2011  . NEUROPATHY, PERIPHERAL 03/27/2006   Qualifier: Diagnosis of  By: Levada Schilling    . OSTEOPENIA 03/27/2006  . Stroke, lacunar ([facility]) 04/28/2015  . UTI (urinary tract infection) 03/23/2015  . Vitamin B12 deficiency 04/28/2015  . Vitamin D deficiency 06/13/2011    Past Surgical History: Past Surgical History:  Procedure Laterality Date  . ABDOMINAL HYSTERECTOMY    . INCISION AND DRAINAGE PERIRECTAL ABSCESS  06/16/2011   Procedure: IRRIGATION AND DEBRIDEMENT PERIRECTAL ABSCESS;  Surgeon: Robyne Askew, MD;  Location: [facility] OR;  Service: General;  Laterality: N/A;    Social History: Social History   Tobacco Use  . Smoking status: Current Every Day Smoker    Packs/day: 0.50    Types: Cigarettes    Last attempt to quit: 06/15/2011    Years since quitting: 7.5  . Smokeless tobacco: Never Used  Substance Use Topics  . Alcohol use: No  . Drug use: No   Additional social history: smokes 0.5 ppd, smoking since age 58, no alcohol, no drugs Please also refer to relevant sections of EMR.  Family History: Family History  Problem Relation Age of Onset  . Kidney disease Mother   . Cancer Sister        colon  . Cancer Brother        lung  . Cancer Sister        stomach  . Dementia Sister   . Dementia Sister   . Dementia Sister   . Dementia Brother     Allergies and Medications: Allergies  Allergen Reactions  . Sulfonamide Derivatives Other (See Comments)    Exact reaction not recalled- patient remarked it made her "not feel well"   No current facility-administered medications on file prior to encounter.    Current Outpatient Medications on File Prior to Encounter  Medication Sig Dispense Refill  . acetaminophen (TYLENOL) 500 MG tablet Take 500 mg by mouth every 6 (six) hours as needed (for a fever of 99.5-101 F and monitor until normal or for headaches & minor discomfort- CANNOT EXCEED 2,000 MG/24 HOURS).    Marland Kitchen alum & mag hydroxide-simeth (MI-ACID) 200-200-20 MG/5ML suspension Take 30 mLs by mouth every 6 (six) hours as needed for indigestion or heartburn (NOT TO EXCEED 4 DOSES/24 HOURS).    Marland Kitchen divalproex (DEPAKOTE SPRINKLE) 125 MG capsule Take 125 mg by mouth See admin instructions. Take 125 mg by mouth three times a day- 8 AM, 1 PM, and 8 PM    . guaifenesin (ROBAFEN) 100 MG/5ML syrup Take 200 mg by mouth every 6 (six) hours as needed for cough (NOT  TO EXCEED 4 DOSES/24 HOURS).    Marland Kitchen. loperamide (IMODIUM) 2 MG capsule Take 2 mg by mouth as needed (with each loose stool or diarrhea- cannot exceed 8 doses/24 hours).    . magnesium hydroxide (MILK OF MAGNESIA) 400 MG/5ML suspension Take 30 mLs by mouth at bedtime as needed for mild constipation.    Marland Kitchen. Neomycin-Bacitracin-Polymyxin (TRIPLE ANTIBIOTIC) [phone] OINT Apply 1 application topically See admin instructions. Apply to affected area(s) after cleaning with normal saline, then cover with gauze and tape or Band-Aid(s)- until healed    . OLANZapine (ZYPREXA) 2.5 MG tablet Take 2.5 mg by mouth every morning.    . cyanocobalamin (,VITAMIN B-12,) 1000 MCG/ML injection Inject 1 mL (1,000 mcg total) into the muscle every 30 (thirty) days. (Patient not taking: Reported on 01/16/2019) 1 mL 2    Objective: BP (!) 156/75   Pulse 74   Temp 99.3 F (37.4 C) (Oral)   Resp 16   SpO2 97%  Physical Exam  Constitutional: She is well-developed, well-nourished, and in no distress. No distress.  HENT:  Head: Normocephalic and atraumatic.  Eyes: Pupils are equal, round, and reactive to light. Conjunctivae and EOM are normal.  Cardiovascular: Normal rate,  regular rhythm and normal heart sounds. Exam reveals no friction rub.  No murmur heard. Pulmonary/Chest: Effort normal and breath sounds normal. No respiratory distress. She has no wheezes. She has no rales.  Abdominal: Soft. Bowel sounds are normal. She exhibits no distension. There is no abdominal tenderness. There is no rebound.  Musculoskeletal:        General: Edema (2+ lower extremity edema bilaterally to the calves) present.     Cervical back: Normal range of motion.     Comments: Patient reports pain with weightbearing on her left hip.  On palpation of the femur there is no tenderness.  Pelvis was stable on exam.  Neurological: She is alert.  Patient is oriented to person.  She is not oriented to place.  She is oriented to the month but not the year.  Skin: Skin is warm and dry. She is not diaphoretic.    Labs and Imaging: CBC BMET  Recent Labs  Lab 01/15/19 2328  WBC 6.6  HGB 13.4  HCT 42.3  PLT 239   Recent Labs  Lab 01/15/19 2328  NA 137  K 5.2*  CL 104  CO2 23  BUN 16  CREATININE 0.87  GLUCOSE 129*  CALCIUM 9.1     UA -Hgb trace -Ketones negative -Leukocytes small -Nitrite negative -Protein 30 -pH 7.0 -Specific gravity 1.015 -Bacteria few -Squamous epithelium 0-5 -WBC 6-10  MR HIP LEFT WO CONTRAST  Result Date: 01/16/2019 CLINICAL DATA:  Left hip pain after fall yesterday. EXAM: MR OF THE LEFT HIP WITHOUT CONTRAST TECHNIQUE: Multiplanar, multisequence MR imaging was performed. No intravenous contrast was administered. COMPARISON:  Left hip x-rays from yesterday. FINDINGS: Bones: Acute nondisplaced fracture of the left inferior pubic ramus. Contusion in the left superior pubic ramus without discrete fracture line. No femur fracture. No dislocation or avascular necrosis. No focal bone lesion. Mild degenerative changes of the pubic symphysis and bilateral sacroiliac joints. Articular cartilage and labrum Articular cartilage: Diffuse mild partial-thickness  cartilage loss in both hip joints with areas of full-thickness cartilage loss over the superolateral joint space. Labrum: Degenerated but grossly intact. Joint or bursal effusion Joint effusion: Trace bilateral hip joint effusions. Bursae: No focal periarticular fluid collection. Muscles and tendons Muscles and tendons: The visualized gluteus, hamstring and iliopsoas tendons  are intact. Prominent edema in the left proximal adductor and obturator externus muscles. Other findings Miscellaneous: Prior hysterectomy. The visualized internal pelvic contents otherwise appear unremarkable. IMPRESSION: 1. Acute nondisplaced fracture of the left inferior pubic ramus. Contusion in the left superior pubic ramus without discrete fracture. 2. Moderate strain of the left proximal adductor and obturator externus muscles. 3. Mild bilateral hip osteoarthritis. Electronically Signed   By: Obie Dredge M.D.   On: 01/16/2019 07:28   DG Hip Unilat With Pelvis 2-3 Views Left  Result Date: 01/15/2019 CLINICAL DATA:  Fall. EXAM: DG HIP (WITH OR WITHOUT PELVIS) 2-3V LEFT COMPARISON:  CT 06/16/2011. FINDINGS: Diffuse osteopenia. Degenerative change lumbar spine and both hips. No acute abnormality identified. No evidence of fracture dislocation. Pelvic calcifications consistent phleboliths. IMPRESSION: Diffuse osteopenia degenerative change. No acute abnormality identified. Electronically Signed   By: Maisie Fus  Register   On: 01/15/2019 16:08     Derrel Nip, MD 01/16/2019, 10:40 AM PGY-1, [facility] FPTS Intern pager: [phone], text pages welcome   FPTS Upper-Level Resident Addendum   I have independently interviewed and examined the patient. I have discussed the above with the original author and agree with their documentation. My edits for correction/addition/clarification are in green. Please see also any attending notes.   Luis Abed, D.O. PGY-2, [facility] 01/16/2019  3:20 PM  FPTS Service pager: [phone] (text pages welcome through [facility])

## 2019-01-16 NOTE — ED Notes (Signed)
Pt pulled out IV. Primary RN informed

## 2019-01-16 NOTE — ED Notes (Signed)
Called MRI for update; no update at this time other than pt will probably be seen after 0300.

## 2019-01-16 NOTE — ED Notes (Signed)
Lunch tray ordered 

## 2019-01-16 NOTE — Progress Notes (Signed)
Discussed case with Dr. Stark Jock.  Reviewed imaging.  Patient can be WBAT BLE.  Mobilize with PT as pain allows.  Pain control per primary service.  Follow up in office in 2-4 weeks.

## 2019-01-16 NOTE — ED Provider Notes (Signed)
Patient care assumed from Dr. Dina Rich at shift change.  Patient initially seen by Dr. Langston Masker yesterday for evaluation of leg swelling, fall, and hip pain.  Patient's initial plain films are unremarkable and MRI was ordered due to the patient's lack of ability to ambulate.  Care signed out to me awaiting these results.  The MRI has returned and shows what appears to be an inferior rami fracture with superior rami contusion and strains of the hip musculature. Patient continues to be unable to ambulate effectively on her own.  I have updated the daughter, [name] on the results of these tests.  I have also spoken with the assisted living facility where she stays.  It sounds as though she has had increased leg swelling and decreased mobility due to this over the past week.  This apparently resulted in her fall yesterday.  Her facility has doubts as to whether or not they can adequately care for her needs, so I feel as though the patient will require admission for pain control, diuresis, and physical therapy.  I have spoken with the family practice service who agrees to admit.  I have also spoken with Dr. Erlinda Hong from orthopedic surgery regarding her injury.  He does not feel as though this is surgical.   Veryl Speak, MD 01/16/19 [id number]

## 2019-01-16 NOTE — Plan of Care (Signed)

## 2019-01-16 NOTE — ED Notes (Signed)
Pt resting.

## 2019-01-16 NOTE — Plan of Care (Signed)

## 2019-01-16 NOTE — ED Notes (Signed)
Took pt to bathroom

## 2019-01-16 NOTE — ED Notes (Signed)
Patient transported to MRI 

## 2019-01-17 DIAGNOSIS — E559 Vitamin D deficiency, unspecified: Secondary | ICD-10-CM

## 2019-01-17 LAB — HEPATIC FUNCTION PANEL
ALT: 11 U/L (ref 0–44)
AST: 19 U/L (ref 15–41)
Albumin: 2.8 g/dL — ABNORMAL LOW (ref 3.5–5.0)
Alkaline Phosphatase: 47 U/L (ref 38–126)
Bilirubin, Direct: 0.2 mg/dL (ref 0.0–0.2)
Indirect Bilirubin: 0.7 mg/dL (ref 0.3–0.9)
Total Bilirubin: 0.9 mg/dL (ref 0.3–1.2)
Total Protein: 5.8 g/dL — ABNORMAL LOW (ref 6.5–8.1)

## 2019-01-17 LAB — BASIC METABOLIC PANEL
Anion gap: 9 (ref 5–15)
BUN: 17 mg/dL (ref 8–23)
CO2: 24 mmol/L (ref 22–32)
Calcium: 8.8 mg/dL — ABNORMAL LOW (ref 8.9–10.3)
Chloride: 104 mmol/L (ref 98–111)
Creatinine, Ser: 0.85 mg/dL (ref 0.44–1.00)
GFR calc Af Amer: >60 mL/min (ref >60)
GFR calc non Af Amer: >60 mL/min (ref >60)
Glucose, Bld: 102 mg/dL — ABNORMAL HIGH (ref 70–99)
Potassium: 4.4 mmol/L (ref 3.5–5.1)
Sodium: 137 mmol/L (ref 135–145)

## 2019-01-17 LAB — VITAMIN D 25 HYDROXY (VIT D DEFICIENCY, FRACTURES): Vit D, 25-Hydroxy: 9.34 ng/mL — ABNORMAL LOW (ref 30–100)

## 2019-01-17 LAB — VITAMIN B12: Vitamin B-12: 350 pg/mL (ref 180–914)

## 2019-01-17 MED ORDER — ACETAMINOPHEN 500 MG PO TABS
500.0000 mg | ORAL_TABLET | Freq: Four times a day (QID) | ORAL | Status: DC
  Administered 2019-01-17 – 2019-01-21 (×17): 500 mg via ORAL
  Filled 2019-01-17 (×17): qty 1

## 2019-01-17 MED ORDER — POLYETHYLENE GLYCOL 3350 17 G PO PACK
17.0000 g | PACK | Freq: Every day | ORAL | Status: DC
  Administered 2019-01-17 – 2019-01-21 (×5): 17 g via ORAL
  Filled 2019-01-17 (×4): qty 1

## 2019-01-17 MED ORDER — ACETAMINOPHEN 500 MG PO TABS: 500.0000 mg | ORAL_TABLET | Freq: Four times a day (QID) | ORAL | Status: DC

## 2019-01-17 MED ORDER — VITAMIN D (ERGOCALCIFEROL) 1.25 MG (50000 UNIT) PO CAPS
50000.0000 [IU] | ORAL_CAPSULE | ORAL | Status: DC
  Administered 2019-01-17: 50000 [IU] via ORAL
  Filled 2019-01-17 (×2): qty 1

## 2019-01-17 NOTE — Evaluation (Signed)
Physical Therapy Evaluation Patient Details Name: Joann [name] MRN: [id number] DOB: September 14, 1927 Today's Date: 01/17/2019   History of Present Illness  Pt is an 85 yo female s/p fall resulting in L inferior ramus fx and a contusion in the left superior pubic ramus, moderate strain of the left proximal adductor and obturator externus muscles. Pt with recent LLE swelling placed in w/c in ALF after 2-3 days, WBAT. Non-operative fx. PMHx:vitamin B12 deficiency, Alzheimer's and vascular dementia, vitamin D deficiency, HLD, tobacco dependence, and osteopenia  Clinical Impression  Pt admitted with above diagnosis. Pt fearful of mobilizing but was able to come to standing with +2 mod A and ambulate 5' with RW. Will need higher level of care for rehab before returning to ALF.  Pt currently with functional limitations due to the deficits listed below (see PT Problem List). Pt will benefit from skilled PT to increase their independence and safety with mobility to allow discharge to the venue listed below.       Follow Up Recommendations SNF;Supervision for mobility/OOB    Equipment Recommendations  None recommended by PT    Recommendations for Other Services       Precautions / Restrictions Precautions Precautions: Fall Restrictions Weight Bearing Restrictions: Yes RLE Weight Bearing: Weight bearing as tolerated LLE Weight Bearing: Weight bearing as tolerated      Mobility  Bed Mobility Overal bed mobility: Needs Assistance Bed Mobility: Supine to Sit     Supine to sit: Mod assist;+2 for safety/equipment     General bed mobility comments: mod A for LLE movement, vc's for use of rail and sequencing, mod A to scoot hips to EOB  Transfers Overall transfer level: Needs assistance Equipment used: Rolling walker (2 wheeled) Transfers: Sit to/from Stand Sit to Stand: Mod assist;+2 physical assistance         General transfer comment: mod A +2 for power up, pt fearful of standing and  fearful of putting wt LLE. vc's for hand placement  Ambulation/Gait Ambulation/Gait assistance: Min assist;+2 physical assistance Gait Distance (Feet): 5 Feet Assistive device: Rolling walker (2 wheeled) Gait Pattern/deviations: Step-to pattern;Decreased weight shift to left;Decreased step length - left;Antalgic Gait velocity: decreased Gait velocity interpretation: <1.31 ft/sec, indicative of household ambulator General Gait Details: pt needed vc's for sequencing and each move of RW. Pt able to tolerate wt on LLE with assist from UE's. Fatigues quickly  Stairs            Wheelchair Mobility    Modified Rankin (Stroke Patients Only)       Balance Overall balance assessment: Needs assistance;History of Falls Sitting-balance support: Feet supported;No upper extremity supported Sitting balance-Leahy Scale: Fair     Standing balance support: Bilateral upper extremity supported;During functional activity Standing balance-Leahy Scale: Poor Standing balance comment: needs UE support and external assist for safety at this point                             Pertinent Vitals/Pain Pain Assessment: 0-10 Pain Score: 8  Pain Location: L pelvis Pain Descriptors / Indicators: Aching;Grimacing;Guarding Pain Intervention(s): Limited activity within patient's tolerance;Monitored during session    Home Living Family/patient expects to be discharged to:: Skilled nursing facility               Home Equipment: Walker - 4 wheels Additional Comments: From ALF per chart    Prior Function Level of Independence: Needs assistance   Gait / Transfers Assistance Needed: Mobility  with rollator prior to weakness and fall; pt was using w/c for mobility due to weakness.  ADL's / Homemaking Assistance Needed: "I ask people to help me."        Hand Dominance   Dominant Hand: Right    Extremity/Trunk Assessment   Upper Extremity Assessment Upper Extremity Assessment: Defer  to OT evaluation    Lower Extremity Assessment Lower Extremity Assessment: LLE deficits/detail LLE Deficits / Details: hip flex 2-/5, knee ext 3-/5, ankle WFL LLE Sensation: WNL LLE Coordination: WNL    Cervical / Trunk Assessment Cervical / Trunk Assessment: Normal  Communication   Communication: No difficulties  Cognition Arousal/Alertness: Awake/alert Behavior During Therapy: WFL for tasks assessed/performed Overall Cognitive Status: History of cognitive impairments - at baseline                                 General Comments: STM deficits, poor historian      General Comments General comments (skin integrity, edema, etc.): pt very agreeable to  therapy    Exercises     Assessment/Plan    PT Assessment Patient needs continued PT services  PT Problem List Decreased strength;Decreased activity tolerance;Decreased balance;Decreased mobility;Decreased cognition;Decreased knowledge of use of DME;Decreased knowledge of precautions;Pain       PT Treatment Interventions DME instruction;Gait training;Functional mobility training;Therapeutic activities;Therapeutic exercise;Balance training;Patient/family education    PT Goals (Current goals can be found in the Care Plan section)  Acute Rehab PT Goals Patient Stated Goal: get better PT Goal Formulation: With patient Time For Goal Achievement: 01/31/19 Potential to Achieve Goals: Good    Frequency Min 3X/week   Barriers to discharge Decreased caregiver support      [name]-evaluation PT/OT/SLP [name]-Evaluation/Treatment: Yes Reason for [name]-Treatment: Complexity of the patient's impairments (multi-system involvement);For patient/therapist safety PT goals addressed during session: Mobility/safety with mobility;Balance;Proper use of DME         AM-PAC PT "6 Clicks" Mobility  Outcome Measure Help needed turning from your back to your side while in a flat bed without using bedrails?: A Lot Help needed moving from  lying on your back to sitting on the side of a flat bed without using bedrails?: A Lot Help needed moving to and from a bed to a chair (including a wheelchair)?: A Lot Help needed standing up from a chair using your arms (e.g., wheelchair or bedside chair)?: A Lot Help needed to walk in hospital room?: A Lot Help needed climbing 3-5 steps with a railing? : Total 6 Click Score: 11    End of Session Equipment Utilized During Treatment: Gait belt Activity Tolerance: Patient tolerated treatment well Patient left: in chair;with call bell/phone within reach;with chair alarm set Nurse Communication: Mobility status PT Visit Diagnosis: Unsteadiness on feet (R26.81);History of falling (Z91.81);Difficulty in walking, not elsewhere classified (R26.2);Pain Pain - Right/Left: Left Pain - part of body: Leg    Time: 0803-0827 PT Time Calculation (min) (ACUTE ONLY): 24 min   Charges:   PT Evaluation $PT Eval Moderate Complexity: 1 Mod          Joann [name], PT  Acute Rehab Services  Pager [phone] Office [phone]   Joann [name] 01/17/2019, 9:54 AM

## 2019-01-17 NOTE — Plan of Care (Signed)
  Problem: Activity: Goal: Risk for activity intolerance will decrease Outcome: Progressing   Problem: Coping: Goal: Level of anxiety will decrease Outcome: Progressing   Problem: Pain Managment: Goal: General experience of comfort will improve Outcome: Progressing   Problem: Safety: Goal: Ability to remain free from injury will improve Outcome: Progressing   

## 2019-01-17 NOTE — Progress Notes (Signed)
Family Medicine Teaching Service Daily Progress Note Intern Pager: [phone]  Patient name: Joann [name] Medical record number: [id number] Date of birth: 10/08/1927 Age: 85 y.o. Gender: female  Primary Care Provider: Gerlene Fee, DO Consultants: orthopedics Code Status: DNR Preferred Emergency Contact: Leilanee Righetti, daughter, [place] [phone]  Pt Overview and Major Events to Date:  12/19-admit for pain control of pelvic ramus fracture  Assessment and Plan: Joann [name] is a 85 y.o.  woman admitted for pain control following hip fracture.  PMH is significant for vitamin B12 deficiency, Alzheimer's and vascular dementia, vitamin D deficiency, HLD, tobacco dependence, and osteopenia  Fall  Left Inferior Pubic Ramus Fracture Orthopedics was consulted and gave recommendations, including WBAT, mobilize with PT, non-operative fracture management, office follow-up in 2-4 weeks.  Ortho signed off. Spoke with Daughter who is concerned that ALF will not be able to provide the care her mother needs for the long-term and is interested in pursuing SNF if recommended. -Weightbearing as tolerated bilaterally -Mobilize with PT as pain allows - ortho outpatient follow up in 2-4 weeks -Tylenol every 6 hours scheduled -IV morphine 1 mg q8h as needed for severe pain -PT/OT eval and treat -Continue Depakote Sprinkle  Lower extremity edema Possibly secondary to chronic venous stasis although BNP is mildly elevated to 103.  No previous echocardiogram available in epic.  Nonspecific T wave flattening noted on EKG.  Will consider echocardiogram today. -Monitor respiratory status -Consider echocardiogram - compression stockings  Hyperkalemia No changes on EKG consistent with hyperkalemia.   No further work-up at this time. -Follow-up potassium -Morning BMPs  Alzheimer's/vascular dementia Patient was diagnosed with dementia in 2012 and the family did not wish to pursue treatment at that time.  Patient  was never started on Namenda.  Patient is currently living in ALF but family is concerned for need for SNF placement.  On admission is A&Ox3, not to year.  Daughter notes that she waxes and wanes and occasionally is "much worse." -Delirium precautions -Monitor mentation  Aggression and irritability likely secondary to dementia Joann [name] (patient's daughter) was contacted today over the phone to discuss all of funds Zyprexa.  She will be noted that her dementia has significantly progressed in the past year and she has had many episodes of irritability and occasional aggression.  Zyprexa was started when she was put into an assisted living facility and was having frequent episodes of aggression and agitation with the staff.  She was told that it was started for behavioral modification.  FEN/GI: Regular diet Prophylaxis: Lovenox  Disposition:  Discharge pending PT/OT recommendations.  Possible SNF placement.  Subjective:  No acute events overnight.  Feels well this morning.  Denies any lower back, hip, leg pain.  No issues with chest pain or palpitations overnight.  Objective: Temp:  [98.4 F (36.9 C)-98.9 F (37.2 C)] 98.4 F (36.9 C) (12/20 0353) Pulse Rate:  [74-84] 75 (12/20 0353) Resp:  [14-16] 14 (12/19 1920) BP: (122-156)/(57-79) 122/69 (12/20 0353) SpO2:  [66 %-100 %] 100 % (12/20 0353) Weight:  [56 kg] 56 kg (12/19 2200)  Physical Exam: General: Alert and oriented to person and place.  Is aware that she is in the hospital to get better but she is not positive what her illness is. HEENT: Neck non-tender without lymphadenopathy, masses or thyromegaly Cardio: Normal S1 and S2, no S3 or S4. Rhythm is regular. No murmurs or rubs.  Pulm: Clear to auscultation bilaterally, no crackles, wheezing, or diminished breath sounds. Normal respiratory effort Abdomen: Bowel  sounds normal. Abdomen soft and non-tender.  Extremities: No peripheral edema. Warm/ well perfused.  Strong radial  pulse. Neuro: Cranial nerves grossly intact  Laboratory: Recent Labs  Lab 01/15/19 2328  WBC 6.6  HGB 13.4  HCT 42.3  PLT 239   Recent Labs  Lab 01/15/19 2328  NA 137  K 5.2*  CL 104  CO2 23  BUN 16  CREATININE 0.87  CALCIUM 9.1  GLUCOSE 129*    Imaging/Diagnostic Tests: MR HIP LEFT WO CONTRAST  Result Date: 01/16/2019 CLINICAL DATA:  Left hip pain after fall yesterday. EXAM: MR OF THE LEFT HIP WITHOUT CONTRAST TECHNIQUE: Multiplanar, multisequence MR imaging was performed. No intravenous contrast was administered. COMPARISON:  Left hip x-rays from yesterday. FINDINGS: Bones: Acute nondisplaced fracture of the left inferior pubic ramus. Contusion in the left superior pubic ramus without discrete fracture line. No femur fracture. No dislocation or avascular necrosis. No focal bone lesion. Mild degenerative changes of the pubic symphysis and bilateral sacroiliac joints. Articular cartilage and labrum Articular cartilage: Diffuse mild partial-thickness cartilage loss in both hip joints with areas of full-thickness cartilage loss over the superolateral joint space. Labrum: Degenerated but grossly intact. Joint or bursal effusion Joint effusion: Trace bilateral hip joint effusions. Bursae: No focal periarticular fluid collection. Muscles and tendons Muscles and tendons: The visualized gluteus, hamstring and iliopsoas tendons are intact. Prominent edema in the left proximal adductor and obturator externus muscles. Other findings Miscellaneous: Prior hysterectomy. The visualized internal pelvic contents otherwise appear unremarkable. IMPRESSION: 1. Acute nondisplaced fracture of the left inferior pubic ramus. Contusion in the left superior pubic ramus without discrete fracture. 2. Moderate strain of the left proximal adductor and obturator externus muscles. 3. Mild bilateral hip osteoarthritis. Electronically Signed   By: Obie Dredge M.D.   On: 01/16/2019 07:28     Mirian Mo,  MD 01/17/2019, 5:06 AM PGY-2, [facility] FPTS Intern pager: [phone], text pages welcome

## 2019-01-17 NOTE — Plan of Care (Signed)
  Problem: Education: Goal: Knowledge of General Education information will improve Description: Including pain rating scale, medication(s)/side effects and non-pharmacologic comfort measures Outcome: Progressing   Problem: Health Behavior/Discharge Planning: Goal: Ability to manage health-related needs will improve Outcome: Progressing   Problem: Clinical Measurements: Goal: Respiratory complications will improve Outcome: Progressing   Problem: Activity: Goal: Risk for activity intolerance will decrease Outcome: Progressing   Problem: Coping: Goal: Level of anxiety will decrease Outcome: Progressing   Problem: Pain Managment: Goal: General experience of comfort will improve Outcome: Progressing   Problem: Safety: Goal: Ability to remain free from injury will improve Outcome: Progressing   Problem: Skin Integrity: Goal: Risk for impaired skin integrity will decrease Outcome: Progressing   

## 2019-01-17 NOTE — Evaluation (Signed)
Occupational Therapy Evaluation Patient Details Name: Joann [name] MRN: [id number] DOB: April 15, 1927 Today's Date: 01/17/2019    History of Present Illness Pt is an 85 yo female s/p fall resulting in L inferior ramus fx and a contusion in the left superior pubic ramus, moderate strain of the left proximal adductor and obturator externus muscles. Pt with recent LLE swelling placed in w/c in ALF after 2-3 days, WBAT. Non-operative fx. PMHx:vitamin B12 deficiency, Alzheimer's and vascular dementia, vitamin D deficiency, HLD, tobacco dependence, and osteopenia   Clinical Impression   Pt PTA: living in ALF vs SNF. Pt reports "living in a house and people come to help me." Per chart, pt was quite mobile and recently in W/C due to swelling in LLE. Pt currently limited by decreased activity tolerance, increased pain, decreased strength and increased assist required for ADL/mobility. Pt following commands well today. Pt performing ADL with modA overall; minA +2 for mobility with RW and modA +2 for bed mobility and sit to stands. Pt would benefit from continued OT skilled services for ADL, mobility and safety in SNF setting. OT following acutely.     Follow Up Recommendations  SNF;Supervision/Assistance - 24 hour    Equipment Recommendations  3 in 1 bedside commode    Recommendations for Other Services       Precautions / Restrictions Precautions Precautions: Fall Restrictions Weight Bearing Restrictions: No RLE Weight Bearing: Weight bearing as tolerated LLE Weight Bearing: Weight bearing as tolerated      Mobility Bed Mobility Overal bed mobility: Needs Assistance Bed Mobility: Supine to Sit     Supine to sit: Mod assist;+2 for safety/equipment     General bed mobility comments: mod A for LLE movement, vc's for use of rail and sequencing, mod A to scoot hips to EOB  Transfers Overall transfer level: Needs assistance Equipment used: Rolling walker (2 wheeled) Transfers: Sit to/from  Stand Sit to Stand: Mod assist;+2 physical assistance         General transfer comment: ModA +2 for power up; pt fearful of falling; VCs for hand placement    Balance Overall balance assessment: Needs assistance;History of Falls Sitting-balance support: Feet supported;No upper extremity supported Sitting balance-Leahy Scale: Fair     Standing balance support: Bilateral upper extremity supported;During functional activity Standing balance-Leahy Scale: Poor Standing balance comment: needs UE support and external assist for safety at this point                           ADL either performed or assessed with clinical judgement   ADL Overall ADL's : Needs assistance/impaired Eating/Feeding: Set up;Sitting   Grooming: Set up;Sitting   Upper Body Bathing: Set up;Sitting   Lower Body Bathing: Moderate assistance;Sitting/lateral leans;Sit to/from stand;Cueing for sequencing;Cueing for safety   Upper Body Dressing : Set up;Sitting   Lower Body Dressing: Moderate assistance;Sitting/lateral leans;Sit to/from stand;Cueing for safety   Toilet Transfer: Moderate assistance;Stand-pivot;BSC;RW   Toileting- Clothing Manipulation and Hygiene: Minimal assistance;+2 for physical assistance;+2 for safety/equipment;Cueing for safety;Sitting/lateral lean;Sit to/from stand       Functional mobility during ADLs: Minimal assistance;+2 for physical assistance;+2 for safety/equipment;Rolling walker;Cueing for safety;Cueing for sequencing General ADL Comments: Pt limited by decreased activity tolerance, increased pain, decreased strength and increased assist required for ADL/mobility.     Vision Baseline Vision/History: Wears glasses Wears Glasses: At all times Patient Visual Report: No change from baseline Vision Assessment?: No apparent visual deficits Additional Comments: Pt reading name tag very  well.     Perception     Praxis      Pertinent Vitals/Pain Pain Assessment:  0-10 Pain Score: 8  Pain Location: L pelvis Pain Descriptors / Indicators: Aching;Grimacing;Guarding Pain Intervention(s): Limited activity within patient's tolerance;Monitored during session     Hand Dominance Right   Extremity/Trunk Assessment Upper Extremity Assessment Upper Extremity Assessment: Generalized weakness   Lower Extremity Assessment Lower Extremity Assessment: Defer to PT evaluation;Generalized weakness   Cervical / Trunk Assessment Cervical / Trunk Assessment: Normal   Communication Communication Communication: No difficulties   Cognition Arousal/Alertness: Awake/alert Behavior During Therapy: WFL for tasks assessed/performed Overall Cognitive Status: History of cognitive impairments - at baseline                                 General Comments: STM deficits, poor historian   General Comments       Exercises     Shoulder Instructions      Home Living Family/patient expects to be discharged to:: Skilled nursing facility                             Home Equipment: Walker - 4 wheels   Additional Comments: From ALF per chart      Prior Functioning/Environment Level of Independence: Needs assistance  Gait / Transfers Assistance Needed: Mobility with rollator prior to weakness and fall; pt was using w/c for mobility due to weakness. ADL's / Homemaking Assistance Needed: "I ask people to help me."            OT Problem List: Decreased strength;Decreased activity tolerance;Impaired balance (sitting and/or standing);Decreased coordination;Decreased safety awareness;Pain;Increased edema      OT Treatment/Interventions: Self-care/ADL training;Therapeutic exercise;Neuromuscular education;Energy conservation;DME and/or AE instruction;Therapeutic activities;Patient/family education;Balance training    OT Goals(Current goals can be found in the care plan section) Acute Rehab OT Goals Patient Stated Goal: get better OT Goal  Formulation: With patient Time For Goal Achievement: 01/31/19 Potential to Achieve Goals: Good ADL Goals Pt Will Perform Grooming: with supervision;standing Pt Will Perform Lower Body Dressing: with min assist;sitting/lateral leans;sit to/from stand Pt Will Transfer to Toilet: with min guard assist;ambulating;regular height toilet Pt Will Perform Toileting - Clothing Manipulation and hygiene: with min guard assist;sitting/lateral leans;sit to/from stand Pt/caregiver will Perform Home Exercise Program: Increased strength;Both right and left upper extremity;With written HEP provided  OT Frequency: Min 2X/week   Barriers to D/C:            Co-evaluation PT/OT/SLP Co-Evaluation/Treatment: Yes Reason for Co-Treatment: Complexity of the patient's impairments (multi-system involvement)   OT goals addressed during session: ADL's and self-care;Strengthening/ROM      AM-PAC OT "6 Clicks" Daily Activity     Outcome Measure Help from another person eating meals?: A Little Help from another person taking care of personal grooming?: A Little Help from another person toileting, which includes using toliet, bedpan, or urinal?: A Lot Help from another person bathing (including washing, rinsing, drying)?: A Lot Help from another person to put on and taking off regular upper body clothing?: A Little Help from another person to put on and taking off regular lower body clothing?: A Lot 6 Click Score: 15   End of Session Equipment Utilized During Treatment: Gait belt;Rolling walker Nurse Communication: Mobility status  Activity Tolerance: Patient tolerated treatment well;Patient limited by pain Patient left: in chair;with call bell/phone within reach;with chair alarm set  OT Visit Diagnosis: Unsteadiness on feet (R26.81);Muscle weakness (generalized) (M62.81);Pain Pain - Right/Left: Left Pain - part of body: Hip                Time: 0800-0828 OT Time Calculation (min): 28 min Charges:  OT  General Charges $OT Visit: 1 Visit OT Evaluation $OT Eval Moderate Complexity: 1 Mod  Cristi LoronAllison (Jelenek) [place] OTR/L Acute Rehabilitation Services Pager: [phone] Office: [phone]   Lonzo CloudLLISON J Pilar Westergaard 01/17/2019, 4:16 PM

## 2019-01-18 DIAGNOSIS — R52 Pain, unspecified: Secondary | ICD-10-CM

## 2019-01-18 MED ORDER — ADULT MULTIVITAMIN W/MINERALS CH
1.0000 | ORAL_TABLET | Freq: Every day | ORAL | Status: DC
  Administered 2019-01-18 – 2019-01-21 (×4): 1 via ORAL
  Filled 2019-01-18 (×4): qty 1

## 2019-01-18 MED ORDER — ENSURE ENLIVE PO LIQD
237.0000 mL | Freq: Three times a day (TID) | ORAL | Status: DC
  Administered 2019-01-18 – 2019-01-21 (×8): 237 mL via ORAL

## 2019-01-18 NOTE — Progress Notes (Addendum)
Family Medicine Teaching Service Daily Progress Note Intern Pager: [phone]  Patient name: Joann [name] Medical record number: [id number] Date of birth: 07-Apr-1927 Age: 85 y.o. Gender: female  Primary Care Provider: Gerlene Fee, DO Consultants: orthopedics Code Status: DNR Preferred Emergency Contact: Cate Oravec, daughter, [place] [phone]  Pt Overview and Major Events to Date:  12/19-admit for pain control of pelvic ramus fracture  Assessment and Plan: Joann [name] is a 85 y.o.  woman admitted for pain control following hip fracture.  PMH is significant for vitamin B12 deficiency, Alzheimer's and vascular dementia, vitamin D deficiency, HLD, tobacco dependence, and osteopenia  Fall  Left Inferior Pubic Ramus Fracture Orthopedics was consulted and gave recommendations, including WBAT, mobilize with PT, non-operative fracture management, office follow-up in 2-4 weeks.  Ortho signed off. Pain is well controlled. SNF if recommended. Stable for discharge.  -Weightbearing as tolerated bilaterally -Mobilize with PT as pain allows - ortho outpatient follow up in 2-4 weeks; signed off -Tylenol every 6 hours scheduled -IV morphine 1 mg q8h as needed for severe pain, last dose was yesterday afternoon  -PT/OT eval and treat -Continue Depakote Sprinkle -Consult to transition of care team for placement  Lower extremity edema Possibly secondary to chronic venous stasis although BNP is mildly elevated to 103.  No previous echocardiogram available in epic.  Nonspecific T wave flattening noted on EKG.  -Monitor respiratory status -Consider echocardiogram - compression stockings  Hyperkalemia No changes on EKG consistent with hyperkalemia.   No further work-up at this time. -Follow-up potassium -Morning BMPs  Alzheimer's/vascular dementia Patient was diagnosed with dementia in 2012 and the family did not wish to pursue treatment at that time.  Patient was never started on Namenda.   Patient is currently living in ALF but family is concerned for need for SNF placement.  On admission is A&Ox3, not to year.  Daughter notes that she waxes and wanes and occasionally is "much worse." -Delirium precautions -Monitor mentation  Aggression and irritability likely secondary to dementia Joann [name] (patient's daughter) was contacted today over the phone to discuss all of funds Zyprexa.  She will be noted that her dementia has significantly progressed in the past year and she has had many episodes of irritability and occasional aggression.  Zyprexa was started when she was put into an assisted living facility and was having frequent episodes of aggression and agitation with the staff.  She was told that it was started for behavioral modification.  FEN/GI: Regular diet Prophylaxis: Lovenox  Disposition:  Discharge pending PT/OT recommendations.  Possible SNF placement.  Subjective:  She is sitting up in bed and doing well. No leg discomfort at this time.  Objective: Temp:  [97.7 F (36.5 C)-98.5 F (36.9 C)] 98.5 F (36.9 C) (12/21 0304) Pulse Rate:  [70-79] 76 (12/21 0304) Resp:  [14-16] 16 (12/21 0304) BP: (112-141)/(57-72) 141/72 (12/21 0304) SpO2:  [96 %-99 %] 98 % (12/21 0304)  Physical Exam:  General: Appears well, no acute distress. Age appropriate. Sitting up in bed Cardiac: RRR, normal heart sounds, no murmurs Respiratory: CTAB, normal effort Extremities: No edema or cyanosis. Skin: Warm and dry, no rashes noted Neuro: alert and oriented, no focal deficits Psych: normal affect  Laboratory: Recent Labs  Lab 01/15/19 2328  WBC 6.6  HGB 13.4  HCT 42.3  PLT 239   Recent Labs  Lab 01/15/19 2328 01/17/19 0442  NA 137 137  K 5.2* 4.4  CL 104 104  CO2 23 24  BUN 16 17  CREATININE 0.87 0.85  CALCIUM 9.1 8.8*  PROT  --  5.8*  BILITOT  --  0.9  ALKPHOS  --  47  ALT  --  11  AST  --  19  GLUCOSE 129* 102*    Imaging/Diagnostic Tests: No results  found.   Lavonda Jumbo, DO 01/18/2019, 2:31 PM PGY-1, [facility] Family Medicine FPTS Intern pager: [phone], text pages welcome

## 2019-01-18 NOTE — [place] FL2 (Signed)
[place] MEDICAID FL2 LEVEL OF CARE SCREENING TOOL     IDENTIFICATION  Patient Name: Joann [name] Birthdate: October 22, 1927 Sex: female Admission Date (Current Location): 01/15/2019  [facility] and [place] Number:  Herbalist and Address:  [facility], [place] [address], [place], [place] [address]      Provider Number: [id number]  Attending Physician Name and Address:  Dickie La, MD  Relative Name and Phone Number:  Candita Borenstein [phone]    Current Level of Care: Hospital Recommended Level of Care: [place] Prior Approval Number:    Date Approved/Denied:   PASRR Number:    Discharge Plan: SNF    Current Diagnoses: Patient Active Problem List   Diagnosis Date Noted  . Closed fracture of left inferior pubic ramus ([place]) 01/16/2019  . Anemia 09/01/2018  . Increased sleeping 08/26/2018  . Swollen ankles 11/09/2015  . Vitamin B12 deficiency 04/28/2015  . Benign Ethnic Leukopenia 04/28/2015  . Stroke, lacunar ([place]) 04/28/2015  . Mixed Alzheimer's and vascular dementia ([place]) 03/23/2015  . Memory loss of unknown cause 10/05/2014  . Vitamin D deficiency 06/13/2011  . KNEE PAIN, LEFT, CHRONIC 02/02/2009  . ANEMIA, PERNICIOUS 09/21/2007  . HYPERLIPIDEMIA 03/27/2006  . TOBACCO DEPENDENCE 03/27/2006  . NEUROPATHY, PERIPHERAL 03/27/2006  . OSTEOPENIA 03/27/2006    Orientation RESPIRATION BLADDER Height & Weight     Self  Normal Incontinent Weight: 56 kg Height:  5\' 10"  (177.8 cm)  BEHAVIORAL SYMPTOMS/MOOD NEUROLOGICAL BOWEL NUTRITION STATUS  Other (Comment)(History of vascular dementia) (N/A) Continent Diet(see discharge summary)  AMBULATORY STATUS COMMUNICATION OF NEEDS Skin   Limited Assist Verbally Normal                       Personal Care Assistance Level of Assistance  Bathing, Feeding, Dressing Bathing Assistance: Limited assistance Feeding assistance: Limited assistance Dressing Assistance: Limited  assistance     Functional Limitations Info  Sight, Speech, Hearing Sight Info: Impaired Hearing Info: Adequate Speech Info: Adequate    SPECIAL CARE FACTORS FREQUENCY  PT (By licensed PT), OT (By licensed OT)     PT Frequency: Min 3X/week OT Frequency: Min 2X/week            Contractures Contractures Info: Not present    Additional Factors Info  Code Status, Allergies, Psychotropic Code Status Info: DNR Allergies Info: Sulfonamide Derivatives Psychotropic Info: Zyprexa         Current Medications (01/18/2019):  This is the current hospital active medication list Current Facility-Administered Medications  Medication Dose Route Frequency Provider Last Rate Last Admin  . acetaminophen (TYLENOL) tablet 500 mg  500 mg Oral Q6H Martyn Malay, MD   500 mg at 01/18/19 1113  . alum & mag hydroxide-simeth (MAALOX/MYLANTA) 200-200-20 MG/5ML suspension 30 mL  30 mL Oral Q6H PRN Gifford Shave, MD      . divalproex (DEPAKOTE SPRINKLE) capsule 125 mg  125 mg Oral Q8H Gifford Shave, MD   125 mg at 01/18/19 1244  . enoxaparin (LOVENOX) injection 40 mg  40 mg Subcutaneous Q24H Gifford Shave, MD   40 mg at 01/18/19 1244  . feeding supplement (ENSURE ENLIVE) (ENSURE ENLIVE) liquid 237 mL  237 mL Oral TID BM Dickie La, MD      . loperamide (IMODIUM) capsule 2 mg  2 mg Oral PRN Gifford Shave, MD      . magnesium hydroxide (MILK OF MAGNESIA) suspension 30 mL  30 mL Oral QHS PRN  Derrel Nip, MD      . morphine 2 MG/ML injection 1 mg  1 mg Intravenous Q8H PRN Derrel Nip, MD   1 mg at 01/17/19 1309  . multivitamin with minerals tablet 1 tablet  1 tablet Oral Daily Nestor Ramp, MD   1 tablet at 01/18/19 1113  . OLANZapine (ZYPREXA) tablet 2.5 mg  2.5 mg Oral Daily Derrel Nip, MD   2.5 mg at 01/18/19 3220  . polyethylene glycol (MIRALAX / GLYCOLAX) packet 17 g  17 g Oral Daily Mirian Mo, MD   17 g at 01/18/19 2542  . Vitamin D (Ergocalciferol) (DRISDOL) capsule  50,000 Units  50,000 Units Oral Q7 days Towanda Octave, MD   50,000 Units at 01/17/19 1402     Discharge Medications: Please see discharge summary for a list of discharge medications.  Relevant Imaging Results:  Relevant Lab Results:   Additional Information SSN: [id number]  Colleen Can MSN, RN, NCM-BC, ACM-RN [phone]  Please be advised that the above-named patient has a primary diagnosis of dementia which supersedes any psychiatric diagnosis. Please be advised that the above-named patient will require a short-term nursing home stay-anticipated 30 days or less for rehabilitation and strengthening. The plan is for return home.

## 2019-01-18 NOTE — Progress Notes (Signed)
Initial Nutrition Assessment  RD working remotely.  DOCUMENTATION CODES:   Underweight  INTERVENTION:   -Downgrade diet to dysphagia 3 (advanced mechanical soft) for ease of intake -Ensure Enlive po TID, each supplement provides 350 kcal and 20 grams of protein -Magic cup TID with meals, each supplement provides 290 kcal and 9 grams of protein  -MVI with minerals daily  NUTRITION DIAGNOSIS:   Increased nutrient needs related to acute illness as evidenced by estimated needs.  GOAL:   Patient will meet greater than or equal to 90% of their needs  MONITOR:   PO intake, Supplement acceptance, Labs, Weight trends, Skin, I & O's  REASON FOR ASSESSMENT:   Other (Comment)    ASSESSMENT:   85 year old woman with history significant for pernicious anemia and resulting pancytopenia secondary to B12 deficiency, Alzheimer's dementia, tobacco use and osteopenia presenting with a left inferior ramus fracture after mechanical fall.  Patient tells similar story to me that she was out smoking a cigarette and somehow falling on her way back from the smoking area at her assisted living facility.  She reports her pain is controlled in the ED.  She denies headaches, dizziness, chest pain, dyspnea, abdominal pain.  She currently lives in an assisted living.  Pt admitted with lt inferior ramus fx s/p fall.   Reviewed I/O's: 120 ml x 24 hours  Attempted to speak with pt via phone, however, no answer. RD unable to obtain further nutrition-related history at this time.   Per orthopedics notes, no plan for surgical intervention at this time.   No meal completion data available to assess at this time. Pt is currently on a regular diet, but with poor dentition.   Reviewed wt hx; pt has experienced a 2.3% wt loss over the past 4 months, which is not significant for time frame. Noted gradual weight loss over the past 3 years, which is concerning given advanced age and dementia. Pt would greatly benefit  from addition of oral nutrition supplements; pt is at high risk for malnutrition, however, RD unable to identify at tis time.   Medications reviewed and include miralax.   Pt was residing in ALF PTA. Therapies are recommending SNF (pt's previous facility expressed concern over ability to care for pt given increased needs) and transition of care team working on SNF placement for discharge.   Labs reviewed.   Diet Order:   Diet Order            Diet regular Room service appropriate? Yes; Fluid consistency: Thin  Diet effective now              EDUCATION NEEDS:   No education needs have been identified at this time  Skin:  Skin Assessment: Reviewed RN Assessment  Last BM:  01/16/19  Height:   Ht Readings from Last 1 Encounters:  01/16/19 5\' 10"  (1.778 m)    Weight:   Wt Readings from Last 1 Encounters:  01/16/19 56 kg    Ideal Body Weight:  72.7 kg  BMI:  Body mass index is 17.71 kg/m.  Estimated Nutritional Needs:   Kcal:  1700-1900  Protein:  75-90 grams  Fluid:  > 1.7 L    Teghan Philbin A. Jimmye Norman, RD, LDN, [place] Registered Dietitian II Certified Diabetes Care and Education Specialist Pager: [phone] After hours Pager: [phone]

## 2019-01-18 NOTE — Plan of Care (Signed)
  Problem: Education: Goal: Knowledge of General Education information will improve Description: Including pain rating scale, medication(s)/side effects and non-pharmacologic comfort measures Outcome: Progressing   Problem: Health Behavior/Discharge Planning: Goal: Ability to manage health-related needs will improve Outcome: Progressing   Problem: Clinical Measurements: Goal: Will remain free from infection Outcome: Progressing Goal: Cardiovascular complication will be avoided Outcome: Progressing   Problem: Nutrition: Goal: Adequate nutrition will be maintained Outcome: Progressing   

## 2019-01-18 NOTE — Progress Notes (Signed)
FPTS Interim Progress Note  Spoke with patient's daughter [name].  To her knowledge, patient does NOT have a history of seizures and was place on depakote AND zyprexa for her behavior when she was first taken to [facility].  She was updated that SW is onboard for SNF placement and she will likely be contacted tomorrow regarding her options.  Will defer decision regarding continuation of zyprexa and depakote to team tomorrow.  [place], DO 01/18/2019, 7:00 PM PGY-2, [place] Service pager [phone]

## 2019-01-18 NOTE — Care Management (Signed)
CM completed the FL2 and PASSR/ MUST screen; with a state nurse review required d/t the patients history of Alzheimer's/vascular dementia and Zyprexa administration for aggression/irritiability. CM faxed the requested documents for the state to review and will continue to follow.  Midge Minium MSN, RN, NCM-BC, ACM-RN [phone]

## 2019-01-19 LAB — SARS CORONAVIRUS 2 (TAT 6-24 HRS): SARS Coronavirus 2: NEGATIVE

## 2019-01-19 NOTE — Discharge Summary (Signed)
[facility] Discharge Summary  Patient name: Joann [name] Medical record number: [id number] Date of birth: 06-11-1927 Age: 85 y.o. Gender: female Date of Admission: 01/15/2019  Date of Discharge: 01/21/19 Admitting Physician: Martyn Malay, MD  Primary Care Provider: Gerlene Fee, DO Consultants: Orthopedics  Indication for Hospitalization: Fall with subsequent left inferior pubic ramus fracture  Discharge Diagnoses/Problem List:  Fall with subsequent left inferior pubic ramus fracture Bilateral lower extremity edema, improved Alzheimer's/vascular dementia Hyperkalemia, resolved Osteopenia Tobacco use disorder H/o pernicious anemia-stable  Disposition: Back to ALF  Discharge Condition: Stable  Discharge Exam:  General: Pleasant, comfortable, no distress, nontoxic, elderly, eating breakfast CV: Regular rate and rhythm, no murmurs identified, no lower extremity edema Lungs: CTA B, no cough, no work of breathing Abdomen: Soft nontender, no rebound Skin: Warm and dry, no wounding to exposed skin Extremities: No edema, skin changes, nontender Neuro psych: Oriented to person and place, answering questions appropriately  Brief Hospital Course:  86 year old female who presented after fall and was found to have subsequent left inferior pubic ramus fracture.  Patient previously at assisted living facility where she had a mechanical fall.  Patient admitted with left inferior ramus fracture and orthopedics and PT evaluated.  Orthopedics recommended weightbearing as tolerated bilaterally and encourage mobilization with PT as pain allowed.  They also recommended follow-up in 2 to 4 weeks.  Patient was admitted for pain control and goal was for patient to leave to a SNF facility.  Patient's Depakote and Zyprexa were discontinued during stay as patient was not having any concerns for behavior changes.  The plan had originally been to discharge patient to SNF, but  patient's daughter was advised by ALF that patient came from [facility]) that they could provide PT for her.  Daughter decided that it would be best for patient to return to familiar surroundings.  She was discharged back to [facility] on 12/24.  At the time of discharge, her vitals were stable and she was without complaint.  Issues for Follow Up:  1. Patient had should have follow-up with orthopedics in 2 to 4 weeks. 2. Patient may benefit from DEXA scan outpatient given history of osteopenia and may need bisphosphonate pending goals of care. 3. Consider starting Bisphosphonate given benefits with recent fracture   Significant Procedures: none  Significant Labs and Imaging:  Recent Labs  Lab 01/15/19 2328  WBC 6.6  HGB 13.4  HCT 42.3  PLT 239   Recent Labs  Lab 01/15/19 2328 01/17/19 0442  NA 137 137  K 5.2* 4.4  CL 104 104  CO2 23 24  GLUCOSE 129* 102*  BUN 16 17  CREATININE 0.87 0.85  CALCIUM 9.1 8.8*  ALKPHOS  --  47  AST  --  19  ALT  --  11  ALBUMIN  --  2.8*   Vitamin B12 350 Vitamin D 9.34  No results found. Results/Tests Pending at Time of Discharge: none  Discharge Medications:  Allergies as of 01/21/2019      Reactions   Sulfonamide Derivatives Other (See Comments)   Exact reaction not recalled- patient remarked it made her "not feel well"      Medication List    STOP taking these medications   cyanocobalamin 1000 MCG/ML injection Commonly known as: (VITAMIN B-12)   divalproex 125 MG capsule Commonly known as: DEPAKOTE SPRINKLE   OLANZapine 2.5 MG tablet Commonly known as: ZYPREXA     TAKE these medications   acetaminophen 500  MG tablet Commonly known as: TYLENOL Take 500 mg by mouth every 6 (six) hours as needed (for a fever of 99.5-101 F and monitor until normal or for headaches & minor discomfort- CANNOT EXCEED 2,000 MG/24 HOURS).   calcium gluconate 500 MG tablet Take 1 tablet (500 mg total) by mouth 3 (three) times  daily.   loperamide 2 MG capsule Commonly known as: IMODIUM Take 2 mg by mouth as needed (with each loose stool or diarrhea- cannot exceed 8 doses/24 hours).   Mi-Acid 200-200-20 MG/5ML suspension Generic drug: alum & mag hydroxide-simeth Take 30 mLs by mouth every 6 (six) hours as needed for indigestion or heartburn (NOT TO EXCEED 4 DOSES/24 HOURS).   Milk of Magnesia 400 MG/5ML suspension Generic drug: magnesium hydroxide Take 30 mLs by mouth at bedtime as needed for mild constipation.   Robafen 100 MG/5ML syrup Generic drug: guaifenesin Take 200 mg by mouth every 6 (six) hours as needed for cough (NOT TO EXCEED 4 DOSES/24 HOURS).   Triple Antibiotic [phone] Oint Apply 1 application topically See admin instructions. Apply to affected area(s) after cleaning with normal saline, then cover with gauze and tape or Band-Aid(s)- until healed   Vitamin D (Ergocalciferol) 1.25 MG (50000 UT) Caps capsule Commonly known as: DRISDOL Take 1 capsule (50,000 Units total) by mouth every 7 (seven) days. Continue until told otherwise. Start taking on: January 24, 2019       Discharge Instructions: Please refer to Patient Instructions section of EMR for full details.  Patient was counseled important signs and symptoms that should prompt return to medical care, changes in medications, dietary instructions, activity restrictions, and follow up appointments.   Follow-Up Appointments: Follow-up Information    [facility]. Go on 01/27/2019.   Why: at 11:10 am for follow up visit. Contact information: [address] [place] [address] [phone]       [facility] Follow up.   Specialty: Assisted Living Facility Why: resume care; therapy Contact information: [address] [place] [address] [phone]            Marthenia Rolling, DO 01/21/2019, 12:17 PM PGY-3, [facility] Family Medicine

## 2019-01-19 NOTE — Progress Notes (Signed)
   01/19/19 1701  TOC Assessment  Barriers to Discharge Awaiting State Approval [facility])   State evaluation for PASRR still pending.  Midge Minium MSN, RN, NCM-BC, ACM-RN [phone]

## 2019-01-19 NOTE — TOC Initial Note (Addendum)
Transition of Care [facility]) - Initial/Assessment Note    Patient Details  Name: Joann [name] MRN: [id number] Date of Birth: [birth year]  Transition of Care [facility]) CM/SW Contact:    Colleen Can MSN, RN, NCM-BC, ACM-RN [phone] Phone Number: 01/19/2019, 2:13 PM  Clinical Narrative:                 CM following for dispositional needs. CM spoke to the patients daughter/POA, Thomasene Ripple, to discuss the POC. Patient was a resident of [address] [facility] P[facility]. Patient is an 85 yo female s/p fall resulting in L inferior ramus fx and a contusion in the left superior pubic ramus, moderate strain of the left proximal adductor and obturator externus muscles. PT/OT eval completed with ST SNF recommended. CM discussed the recommendations with the patients daughter; daughter is agreeable to SNF placement for rehab. CMS SNF compare list given with [facility] SNF selected as the preference choice; pending bed availability. FL2 completed; PASRR/[place] MUST screening pending. Patient will require an updated COVID test/signed DNR. CM team will continue to follow.   Expected Discharge Plan: Skilled Nursing Facility Barriers to Discharge: Continued Medical Work up, SNF Pending bed offer, English as a second language teacher, Awaiting State Approval (PASRR)   Patient Goals and CMS Choice   CMS Medicare.gov Compare Post Acute Care list provided to:: Patient Represenative (must comment)([name] Mcmenamin (daughter/POA)) Choice offered to / list presented to : Adult Children([name] Riechers (daughter/POA))  Expected Discharge Plan and Services Expected Discharge Plan: Skilled Nursing Facility In-house Referral: Clinical Social Work Discharge Planning Services: CM Consult Post Acute Care Choice: Skilled Nursing Facility Living arrangements for the past 2 months: Assisted Living Facility                 DME Arranged: N/A DME Agency: NA       HH Arranged: NA HH Agency: NA        Prior Living Arrangements/Services Living  arrangements for the past 2 months: Assisted Living Facility Lives with:: Facility Resident Patient language and need for interpreter reviewed:: Yes        Need for Family Participation in Patient Care: Yes (Comment) Care giver support system in place?: Yes (comment)   Criminal Activity/Legal Involvement Pertinent to Current Situation/Hospitalization: No - Comment as needed  Activities of Daily Living Home Assistive Devices/Equipment: Other (Comment)([facility] SN[facility], patient unsure what she uses) ADL Screening (condition at time of admission) Patient's cognitive ability adequate to safely complete daily activities?: No Is the patient deaf or have difficulty hearing?: No Does the patient have difficulty seeing, even when wearing glasses/contacts?: No Does the patient have difficulty concentrating, remembering, or making decisions?: Yes Patient able to express need for assistance with ADLs?: Yes Does the patient have difficulty dressing or bathing?: Yes Independently performs ADLs?: No Communication: Needs assistance Is this a change from baseline?: Pre-admission baseline Dressing (OT): Needs assistance Is this a change from baseline?: Pre-admission baseline Grooming: Needs assistance Is this a change from baseline?: Pre-admission baseline Feeding: Needs assistance Is this a change from baseline?: Pre-admission baseline Bathing: Needs assistance Is this a change from baseline?: Pre-admission baseline Toileting: Needs assistance Is this a change from baseline?: Pre-admission baseline In/Out Bed: Needs assistance Is this a change from baseline?: Pre-admission baseline Walks in Home: Needs assistance Is this a change from baseline?: Pre-admission baseline Does the patient have difficulty walking or climbing stairs?: Yes Weakness of Legs: Both Weakness of Arms/Hands: Both  Permission Sought/Granted Permission sought to share information with : Case Manager, Chief of Staff  Representative Permission granted to share information with : Yes, Verbal Permission Granted     Permission granted to share info w AGENCY: [facility]        Emotional Assessment       Orientation: : Oriented to Self Alcohol / Substance Use: Not Applicable Psych Involvement: No (comment)  Admission diagnosis:  Closed fracture of left hip, initial encounter ([place]) [S72.002A] Pubic ramus fracture, left, closed, initial encounter ([place]) [S32.592A] Closed fracture of left inferior pubic ramus ([place]) [S32.592A] Patient Active Problem List   Diagnosis Date Noted  . Closed fracture of left inferior pubic ramus ([place]) 01/16/2019  . Anemia 09/01/2018  . Increased sleeping 08/26/2018  . Swollen ankles 11/09/2015  . Vitamin B12 deficiency 04/28/2015  . Benign Ethnic Leukopenia 04/28/2015  . Stroke, lacunar ([place]) 04/28/2015  . Mixed Alzheimer's and vascular dementia ([place]) 03/23/2015  . Memory loss of unknown cause 10/05/2014  . Vitamin D deficiency 06/13/2011  . KNEE PAIN, LEFT, CHRONIC 02/02/2009  . ANEMIA, PERNICIOUS 09/21/2007  . HYPERLIPIDEMIA 03/27/2006  . TOBACCO DEPENDENCE 03/27/2006  . NEUROPATHY, PERIPHERAL 03/27/2006  . OSTEOPENIA 03/27/2006   PCP:  Gerlene Fee, DO Pharmacy:   [facility], [address] [place] [address] Phone: [phone] Fax: [phone]     Social Determinants of Health (SDOH) Interventions    Readmission Risk Interventions No flowsheet data found.

## 2019-01-19 NOTE — Plan of Care (Signed)

## 2019-01-19 NOTE — Progress Notes (Signed)
Family Medicine Teaching Service Daily Progress Note Intern Pager: [phone]  Patient name: Joann [name] Medical record number: [id number] Date of birth: [birth year] Age: 85 y.o. Gender: female  Primary Care Provider: Gerlene Fee, DO Consultants: orthopedics Code Status: DNR Preferred Emergency Contact: Jaida Basurto, daughter, [place] [phone]  Pt Overview and Major Events to Date:  12/19: admit for pain control of pelvic ramus fracture  Assessment and Plan: Joann [name] is a 85 y.o.  woman admitted for pain control following hip fracture.  PMH is significant for vitamin B12 deficiency, Alzheimer's and vascular dementia, vitamin D deficiency, HLD, tobacco dependence, and osteopenia  Fall  left inferior pubic ramus fracture Orthopedics consulted and recommended weightbearing as tolerated, nonoperative management, mobilize with PT and follow-up in office in 2 to 4 weeks. Pain well controlled this morning. Patient medically ready for discharge to SNF. -Weightbearing as tolerated bilaterally -Mobilize with PT as pain allows -ortho outpatient follow up in 2-4 weeks; signed off -Tylenol every 6 hours scheduled -IV morphine 1 mg q8h prn, last dose was 12/20-will discontinue, please call if patient needs additional pain management -Follow-up with transition of care team for placement  Lower extremity edema, improving Possibly secondary to chronic venous stasis although BNP is mildly elevated to 103.  No previous echocardiogram available in epic.  Nonspecific T wave flattening noted on EKG.  -Monitor respiratory status -Consider echocardiogram -compression stockings  Alzheimer's/vascular dementia Patient was diagnosed with dementia in 2012 and the family did not wish to pursue treatment at that time.  Patient was never started on Namenda.  Patient is currently living in ALF but family is concerned for need for SNF placement. Zyprexa and depakote were started when she was put into an ALF and  was having episodes of aggression and agitation with the staff. No behavioral concerns while admitted here. -Delirium precautions -Monitor mentation -Trial off of Depakote and Zyprexa that were placed on for behavioral concerns but there have been none while admitted  FEN/GI: Regular diet Prophylaxis: Lovenox  Disposition:  Medically stable for discharge to SNF  Subjective:  Patient sitting up in bed and is just finished eating her breakfast.  She states she is not feeling well but denies any weakness, tiredness, shortness of breath, chest pain or any other body aches.  States that otherwise she is doing well.  Objective: Temp:  [98.1 F (36.7 C)-99 F (37.2 C)] 98.1 F (36.7 C) (12/22 0929) Pulse Rate:  [67-80] 70 (12/22 0929) Resp:  [18] 18 (12/22 0929) BP: (102-127)/(56-63) 126/61 (12/22 0929) SpO2:  [95 %-100 %] 100 % (12/22 0929)  Physical Exam: General: NAD, pleasant Cardiovascular: RRR, no LE edema Respiratory: normal work of breathing MSK: moves 4 extremities equally Derm: no rashes appreciated Neuro: CN II-XII grossly intact Psych: AO to self and place, appropriate affect  Laboratory: Recent Labs  Lab 01/15/19 2328  WBC 6.6  HGB 13.4  HCT 42.3  PLT 239   Recent Labs  Lab 01/15/19 2328 01/17/19 0442  NA 137 137  K 5.2* 4.4  CL 104 104  CO2 23 24  BUN 16 17  CREATININE 0.87 0.85  CALCIUM 9.1 8.8*  PROT  --  5.8*  BILITOT  --  0.9  ALKPHOS  --  47  ALT  --  11  AST  --  19  GLUCOSE 129* 102*    Imaging/Diagnostic Tests: No results found.   Cartha Rotert, [place], DO 01/19/2019, 12:18 PM PGY-3, [place] Intern pager: [phone], text pages welcome

## 2019-01-19 NOTE — Progress Notes (Signed)
Physical Therapy Treatment Patient Details Name: Joann [name] MRN: [id number] DOB: 04/27/1927 Today's Date: 01/19/2019    History of Present Illness Pt is an 85 yo female s/p fall resulting in L inferior ramus fx and a contusion in the left superior pubic ramus, moderate strain of the left proximal adductor and obturator externus muscles. Pt with recent LLE swelling placed in w/c in ALF after 2-3 days, WBAT. Non-operative fx. PMHx:vitamin B12 deficiency, Alzheimer's and vascular dementia, vitamin D deficiency, HLD, tobacco dependence, and osteopenia    PT Comments    Pt admitted for above. Pt making good progress as she has decreased her level of assist required with all functional mobility. She required min assist for bed mobility and transfers, as well as min guard for gait for safety. Pt ambulated from the bed to the bathroom and back, declining to sit in the chair. She required cues for hand placement and posture throughout session. She completed some LE exercise as described below but stated that she was too tired to do any more. In terms of cognition, pt with some mild short-term memory deficits but overall WFL for today's activities. She continues to demonstrate deficits in balance, endurance, gait speed, and strength. She would benefit from continued skilled PT intervention in order to address deficits.   Follow Up Recommendations  SNF;Supervision for mobility/OOB     Equipment Recommendations  None recommended by PT    Recommendations for Other Services       Precautions / Restrictions Precautions Precautions: Fall Restrictions Weight Bearing Restrictions: No LLE Weight Bearing: Weight bearing as tolerated    Mobility  Bed Mobility Overal bed mobility: Needs Assistance Bed Mobility: Supine to Sit     Supine to sit: Min assist;HOB elevated     General bed mobility comments: min assist for trunk righting, but pt required little physical assistance and was able to manage BLE  to EOB  Transfers Overall transfer level: Needs assistance Equipment used: Rolling walker (2 wheeled) Transfers: Sit to/from Stand Sit to Stand: Min assist         General transfer comment: minA for power up and cues for hand placement during standing and sitting  Ambulation/Gait Ambulation/Gait assistance: Min guard Gait Distance (Feet): 20 Feet Assistive device: Rolling walker (2 wheeled) Gait Pattern/deviations: Step-to pattern;Trunk flexed;Decreased stride length;Decreased weight shift to left Gait velocity: decreased Gait velocity interpretation: <1.8 ft/sec, indicate of risk for recurrent falls General Gait Details: pt able to tolerate weight on L LE, cues for posture during walk to bathroom and back   Stairs             Wheelchair Mobility    Modified Rankin (Stroke Patients Only)       Balance Overall balance assessment: Needs assistance;History of Falls Sitting-balance support: Feet supported;No upper extremity supported Sitting balance-Leahy Scale: Fair Sitting balance - Comments: able to maintain EOB balance for don/doff gait belt   Standing balance support: Bilateral upper extremity supported;During functional activity Standing balance-Leahy Scale: Poor Standing balance comment: needs UE support on RW for safety at this point                            Cognition Arousal/Alertness: Awake/alert Behavior During Therapy: [facility] for tasks assessed/performed Overall Cognitive Status: History of cognitive impairments - at baseline  General Comments: STM deficits, poor historian      Exercises General Exercises - Lower Extremity Ankle Circles/Pumps: AROM;Both;10 reps;Supine Long Arc Quad: AROM;Both;10 reps;Seated Hip Flexion/Marching: Seated;5 reps;Both;AROM    General Comments        Pertinent Vitals/Pain Pain Assessment: Faces Faces Pain Scale: Hurts little more Pain Location: L  pelvis Pain Descriptors / Indicators: Aching;Grimacing;Guarding Pain Intervention(s): Limited activity within patient's tolerance;Monitored during session;Repositioned    Home Living                      Prior Function            PT Goals (current goals can now be found in the care plan section) Acute Rehab PT Goals Patient Stated Goal: get better PT Goal Formulation: With patient Time For Goal Achievement: 01/31/19 Potential to Achieve Goals: Good Progress towards PT goals: Progressing toward goals    Frequency    Min 3X/week      PT Plan Current plan remains appropriate    Co-evaluation              AM-PAC PT "6 Clicks" Mobility   Outcome Measure  Help needed turning from your back to your side while in a flat bed without using bedrails?: A Little Help needed moving from lying on your back to sitting on the side of a flat bed without using bedrails?: A Little Help needed moving to and from a bed to a chair (including a wheelchair)?: A Little Help needed standing up from a chair using your arms (e.g., wheelchair or bedside chair)?: A Little Help needed to walk in hospital room?: A Little Help needed climbing 3-5 steps with a railing? : Total 6 Click Score: 16    End of Session Equipment Utilized During Treatment: Gait belt Activity Tolerance: Patient tolerated treatment well;Patient limited by fatigue Patient left: in bed;with call bell/phone within reach Nurse Communication: Mobility status PT Visit Diagnosis: Unsteadiness on feet (R26.81);History of falling (Z91.81);Difficulty in walking, not elsewhere classified (R26.2);Pain Pain - Right/Left: Left Pain - part of body: Leg     Time: 3419-3790 PT Time Calculation (min) (ACUTE ONLY): 17 min  Charges:  $Therapeutic Activity: 8-22 mins                    Christel Mormon, SPT    [place] Smiley Birr 01/19/2019, 11:05 AM

## 2019-01-20 DIAGNOSIS — E440 Moderate protein-calorie malnutrition: Secondary | ICD-10-CM | POA: Insufficient documentation

## 2019-01-20 DIAGNOSIS — S32592D Other specified fracture of left pubis, subsequent encounter for fracture with routine healing: Secondary | ICD-10-CM

## 2019-01-20 MED ORDER — CALCIUM GLUCONATE 500 MG PO TABS
1.0000 | ORAL_TABLET | Freq: Three times a day (TID) | ORAL | Status: DC
  Filled 2019-01-20 (×2): qty 1

## 2019-01-20 MED ORDER — CALCIUM CITRATE 950 (200 CA) MG PO TABS
200.0000 mg | ORAL_TABLET | Freq: Every day | ORAL | Status: DC
  Administered 2019-01-20 – 2019-01-21 (×2): 200 mg via ORAL
  Filled 2019-01-20 (×3): qty 1

## 2019-01-20 NOTE — Progress Notes (Signed)
Physical Therapy Treatment Patient Details Name: Joann [name] MRN: [id number] DOB: 03/08/1927 Today's Date: 01/20/2019    History of Present Illness Pt is an 85 yo female s/p fall resulting in L inferior ramus fx and a contusion in the left superior pubic ramus, moderate strain of the left proximal adductor and obturator externus muscles. Pt with recent LLE swelling placed in w/c in ALF after 2-3 days, WBAT. Non-operative fx. PMHx:vitamin B12 deficiency, Alzheimer's and vascular dementia, vitamin D deficiency, HLD, tobacco dependence, and osteopenia    PT Comments    Pt continues to require cues for safety and technique with transfers.  She was able to increase gait to 60' with min guard and RW.  Pt declined exercises due to fatigue.  Cont POC.    Follow Up Recommendations  SNF;Supervision for mobility/OOB     Equipment Recommendations  None recommended by PT    Recommendations for Other Services       Precautions / Restrictions Precautions Precautions: Fall Restrictions RLE Weight Bearing: Weight bearing as tolerated LLE Weight Bearing: Weight bearing as tolerated    Mobility  Bed Mobility Overal bed mobility: Needs Assistance Bed Mobility: Supine to Sit;Sit to Supine     Supine to sit: Min assist Sit to supine: Min assist   General bed mobility comments: min A to scoot forward in bed; cued for technique; min A to get legs back in bed  Transfers Overall transfer level: Needs assistance Equipment used: Rolling walker (2 wheeled) Transfers: Sit to/from Stand Sit to Stand: Min assist         General transfer comment: cue for hand placement and to back all the way to bed to sit  Ambulation/Gait Ambulation/Gait assistance: Min guard Gait Distance (Feet): 60 Feet Assistive device: Rolling walker (2 wheeled) Gait Pattern/deviations: Step-to pattern;Trunk flexed;Decreased stride length;Decreased weight shift to left Gait velocity: decreased   General Gait Details:  cued for RW placement and posture   Stairs             Wheelchair Mobility    Modified Rankin (Stroke Patients Only)       Balance Overall balance assessment: Needs assistance Sitting-balance support: Feet supported;No upper extremity supported Sitting balance-Leahy Scale: Good     Standing balance support: Bilateral upper extremity supported;During functional activity Standing balance-Leahy Scale: Fair                              Cognition Arousal/Alertness: Awake/alert Behavior During Therapy: WFL for tasks assessed/performed Overall Cognitive Status: History of cognitive impairments - at baseline                                        Exercises      General Comments General comments (skin integrity, edema, etc.): declined exercises after walking      Pertinent Vitals/Pain Pain Assessment: No/denies pain    Home Living                      Prior Function            PT Goals (current goals can now be found in the care plan section) Progress towards PT goals: Progressing toward goals    Frequency    Min 3X/week      PT Plan Current plan remains appropriate    Co-evaluation  AM-PAC PT "6 Clicks" Mobility   Outcome Measure  Help needed turning from your back to your side while in a flat bed without using bedrails?: A Little Help needed moving from lying on your back to sitting on the side of a flat bed without using bedrails?: A Little Help needed moving to and from a bed to a chair (including a wheelchair)?: A Little Help needed standing up from a chair using your arms (e.g., wheelchair or bedside chair)?: A Little Help needed to walk in hospital room?: A Little Help needed climbing 3-5 steps with a railing? : A Lot 6 Click Score: 17    End of Session Equipment Utilized During Treatment: Gait belt Activity Tolerance: Patient tolerated treatment well;Patient limited by fatigue Patient  left: in bed;with call bell/phone within reach;with bed alarm set Nurse Communication: Mobility status PT Visit Diagnosis: Unsteadiness on feet (R26.81);History of falling (Z91.81);Difficulty in walking, not elsewhere classified (R26.2);Pain     Time: 3474-2595 PT Time Calculation (min) (ACUTE ONLY): 13 min  Charges:  $Gait Training: 8-22 mins                     Royetta Asal, PT Acute Rehab Services Pager [phone] [facility] [phone] [facility] [phone]    Rayetta Humphrey 01/20/2019, 4:32 PM

## 2019-01-20 NOTE — TOC Progression Note (Signed)
Transition of Care [facility]) - Progression Note    Patient Details  Name: Joann [name] MRN: [id number] Date of Birth: [birth year]  Transition of Care [facility]) CM/SW [place] MSN, RN, NCM-BC, [place] [phone] Phone Number: 01/20/2019, 4:21 PM  Clinical Narrative:    CM spoke to the patients daughter, Joann [name], with a request for the patient to return back to [facility] and for the CM to f/u with Farrel Gobble [facility] admissions coordinator) to discuss the POC needs. Patients daughter feels her mother would progress better if she returned to a familiar environment vs possibly becoming more confused at a new facility. Per Farrel Gobble, the facility is able to manage the patients therapy needs utilizing [facility] for PT/OT with a [place] on 01/25/19. CM discussed the change in the POC with Dr. Sandi Carne, with a tentative DC planned for 12/24. CM updated the patients daughter of the delay in therapy SOC, with the patient agreeable to the patient returning to the facility, if medically stable, on 12/24. CM will continue to follow.    Expected Discharge Plan: Skilled Nursing Facility Barriers to Discharge: Other (comment)(Facility bed at [facility] available 12/24)  Expected Discharge Plan and Services Expected Discharge Plan: [place] In-house Referral: Clinical Social Work Discharge Planning Services: CM Consult Post Acute Care Choice: ([place]) Living arrangements for the past 2 months: [place]                 DME Arranged: N/A DME Agency: NA       HH Arranged: NA [place] Agency: NA         Social Determinants of Health (SDOH) Interventions    Readmission Risk Interventions No flowsheet data found.

## 2019-01-20 NOTE — Progress Notes (Signed)
   01/20/19 0946  TOC Assessment  Barriers to Discharge Awaiting State Approval [facility])   Patient has been accepted to [facility] and per progress notes, is medically cleared for discharge. PASRR still pending. CM will continue to follow.  Midge Minium MSN, RN, NCM-BC, ACM-RN [phone]

## 2019-01-20 NOTE — Progress Notes (Addendum)
Family Medicine Teaching Service Daily Progress Note Intern Pager: [phone]  Patient name: Joann [name] Medical record number: [id number] Date of birth: 12/15/1927 Age: 85 y.o. Gender: female  Primary Care Provider: Gerlene Fee, DO Consultants: orthopedics Code Status: DNR Preferred Emergency Contact: Joann [name], daughter, [place] [phone]  Pt Overview and Major Events to Date:  12/19: admit for pain control of pelvic ramus fracture 12/22: medically cleared for discharge to SNF  Assessment and Plan: Joann [name] is a 85 y.o.  woman admitted for pain control following hip fracture.  PMH is significant for vitamin B12 deficiency, Alzheimer's and vascular dementia, vitamin D deficiency, HLD, tobacco dependence, and osteopenia  Fall  left inferior pubic ramus fracture No acute events overnight. Per ortho, WBAT, nonoperative management, mobilize with PT, and f/u in office in 2-4 weeks. Pain continues to be well controlled. Ambulating well with PT. Patient medically ready for discharge to SNF. COVID negative. -Weightbearing as tolerated bilaterally -Mobilize with PT as pain allows -ortho outpatient follow up in 2-4 weeks; signed off -Tylenol every 6 hours scheduled -Follow-up with transition of care team for placement - continue Vitamin D and Calcium - consider addition of bisphosphonate  Lower extremity edema, improving Continues to sat well on room air. LE edema resolved this AM. Compression stockings in place. -Monitor respiratory status -Consider echocardiogram -compression stockings  Alzheimer's/vascular dementia Patient was diagnosed with dementia in 2012, no treatment pursued per family wishes. Started on Namenda. Prior to admission, lived in [place], started on Zyprexa and Depakote for "episodes of aggression and agitation". No behavioral concerns throughout admission. These were discontinued. No acute events overnight.  -Delirium precautions -Monitor mentation -Trial off of  Depakote and Zyprexa that were placed on for behavioral concerns but there have been none while admitted  FEN/GI: Regular diet Prophylaxis: Lovenox  Disposition:  Medically stable for discharge to SNF  Subjective:  Patient lying comfortably in bed.  Denies any pain.  Notes her pain is well controlled with Tylenol.  Says she walked with physical therapy yesterday, it went well, she states "she loves to walk."  She denies any chest pain, shortness of breath, abdominal pain.  She is able to move her legs without difficulty.  No acute events overnight.  Objective: Temp:  [98.1 F (36.7 C)-98.6 F (37 C)] 98.1 F (36.7 C) (12/23 0339) Pulse Rate:  [61-70] 61 (12/23 0339) Resp:  [16-18] 16 (12/23 0339) BP: (119-141)/(65-92) 119/65 (12/23 0339) SpO2:  [98 %-100 %] 98 % (12/23 0339)  Physical Exam: General: 85 pleasant thin elderly lady, lying comfortably in bed, in no acute distress with non-toxic appearance CV: regular rate and rhythm without murmurs, rubs, or gallops, no lower extremity edema bilaterally Lungs: clear to auscultation bilaterally with normal work of breathing Abdomen: soft, non-tender, non-distended, normoactive bowel sounds Skin: warm, dry Extremities: warm and well perfused, no ecchymosis, hematoma, or swelling appreciated on left lower extremity MSK: Able to move left lower extremity without difficulty or pain Neuro: Alert and oriented, speech normal  Laboratory: Recent Labs  Lab 01/15/19 2328  WBC 6.6  HGB 13.4  HCT 42.3  PLT 239   Recent Labs  Lab 01/15/19 2328 01/17/19 0442  NA 137 137  K 5.2* 4.4  CL 104 104  CO2 23 24  BUN 16 17  CREATININE 0.87 0.85  CALCIUM 9.1 8.8*  PROT  --  5.8*  BILITOT  --  0.9  ALKPHOS  --  47  ALT  --  11  AST  --  19  GLUCOSE 129* 102*    Imaging/Diagnostic Tests: No results found.   Joana Reamer, DO 01/20/2019, 9:38 AM PGY-2, [facility] FPTS Intern pager: [phone], text pages  welcome

## 2019-01-20 NOTE — Progress Notes (Signed)
FPTS Interim Progress Note  Spoke with patient's daughter.  Updated her of discontinuation of Depakote and Zyprexa.  Advised that this can be restarted as needed at [facility].  Spoke with daughter regarding discussion with social worker that patient will be going back to ALF instead of going to SNF, now that they have the therapy that she needs and this will be a more stable environment for this patient given her dementia.  Daughter states that she is currently on the same page and is expecting her mother to be discharged tomorrow to ALF.  Advised that patient will be ready for discharge tomorrow to ALF.  She was very thankful and appreciative of the care that she has received here at [facility] and ankle for all the stuff that has been involved.  [place], DO 01/20/2019, 7:00 PM PGY-2, [place] Service pager [phone]

## 2019-01-20 NOTE — Progress Notes (Signed)
Nutrition Follow-up  DOCUMENTATION CODES:   Underweight, Non-severe (moderate) malnutrition in context of chronic illness  INTERVENTION:   -Continue Ensure Enlive po TID, each supplement provides 350 kcal and 20 grams of protein -Continue Magic cup TID with meals, each supplement provides 290 kcal and 9 grams of protein -Continue MVI with minerals daily -Continue dysphagia 3 diet (advanced mechanical soft) for ease of intake  NUTRITION DIAGNOSIS:   Moderate Malnutrition related to chronic illness(dementia) as evidenced by mild fat depletion, mild muscle depletion.  Ongoing  GOAL:   Patient will meet greater than or equal to 90% of their needs  Progressing   MONITOR:   PO intake, Supplement acceptance, Labs, Weight trends, Skin, I & O's  REASON FOR ASSESSMENT:   Other (Comment)    ASSESSMENT:   85 year old woman with history significant for pernicious anemia and resulting pancytopenia secondary to B12 deficiency, Alzheimer's dementia, tobacco use and osteopenia presenting with a left inferior ramus fracture after mechanical fall.  Patient tells similar story to me that she was out smoking a cigarette and somehow falling on her way back from the smoking area at her assisted living facility.  She reports her pain is controlled in the ED.  She denies headaches, dizziness, chest pain, dyspnea, abdominal pain.  She currently lives in an assisted living.  Reviewed I/O's: -280 ml x 24 hours and +560 ml since admission  UOP: 400 ml x 24 hours  Per orthopedics notes, no plan for surgical intervention at this time.   Spoke with pt at bedside, who was easily distractable at time of visit. Pt complains of her feet being cold. RD helped pt adjust blankets, but continued to complain of cold feet.   Pt shares she has a good appetite and consumes 3 meals per day, however, does not provide further details about this, other than she has a large breakfast and dinner and a light brunch.  Noted lunch tray at bedside, which pt has not touched. Opened pt's meal tray and presented food items to her. RD also offered to set up meal tray, however, pt declined. Noted meal intake has been variable; PO: 25-100%. Pt consumed about 20% of Ensure supplement.   Discussed importance of good meal and supplement intake to promote healing.   Per RNCM notes, plan to discharge to SNF at discharge. Awaiting PASSAR and COVID testing.   Labs reviewed.   NUTRITION - FOCUSED PHYSICAL EXAM:    Most Recent Value  Orbital Region  No depletion  Upper Arm Region  Mild depletion  Thoracic and Lumbar Region  No depletion  Buccal Region  Mild depletion  Temple Region  Mild depletion  Clavicle Bone Region  Mild depletion  Clavicle and Acromion Bone Region  Mild depletion  Scapular Bone Region  Mild depletion  Dorsal Hand  Mild depletion  Patellar Region  No depletion  Anterior Thigh Region  No depletion  Posterior Calf Region  No depletion  Edema (RD Assessment)  None  Hair  Reviewed  Eyes  Reviewed  Mouth  Reviewed  Skin  Reviewed  Nails  Reviewed       Diet Order:   Diet Order            DIET DYS 3 Room service appropriate? Yes; Fluid consistency: Thin  Diet effective now              EDUCATION NEEDS:   No education needs have been identified at this time  Skin:  Skin Assessment: Reviewed RN  Assessment  Last BM:  01/16/19  Height:   Ht Readings from Last 1 Encounters:  01/16/19 5\' 10"  (1.778 m)    Weight:   Wt Readings from Last 1 Encounters:  01/16/19 56 kg    Ideal Body Weight:  72.7 kg  BMI:  Body mass index is 17.71 kg/m.  Estimated Nutritional Needs:   Kcal:  1700-1900  Protein:  75-90 grams  Fluid:  > 1.7 L    Kamaree Berkel A. Jimmye Norman, RD, LDN, [place] Registered Dietitian II Certified Diabetes Care and Education Specialist Pager: [phone] After hours Pager: [phone]

## 2019-01-21 MED ORDER — CALCIUM GLUCONATE 500 MG PO TABS: 1.0000 | ORAL_TABLET | Freq: Three times a day (TID) | ORAL | 0 refills | Status: DC

## 2019-01-21 MED ORDER — VITAMIN D (ERGOCALCIFEROL) 1.25 MG (50000 UNIT) PO CAPS: 50000.0000 [IU] | ORAL_CAPSULE | ORAL | 0 refills | Status: DC

## 2019-01-21 NOTE — TOC Transition Note (Signed)
Transition of Care [facility]) - CM/SW Discharge Note   Patient Details  Name: Tamsyn Owusu MRN: [id number] Date of Birth: March 16, 1927  Transition of Care [facility]) CM/SW Contact:  Midge Minium MSN, RN, NCM-BC, ACM-RN [phone] Phone Number: 01/21/2019, 12:26 PM   Clinical Narrative:    Patient is medically stable to transition back to [facility]. Discharge paperwork faxed to the facility; patients daughter, Auriella Wieand was updated. PTAR will be arranged for 1400.  Please call report to: [phone].   Final next level of care: Assisted Living Barriers to Discharge: No Barriers Identified   Patient Goals and CMS Choice   CMS Medicare.gov Compare Post Acute Care list provided to:: Patient Represenative (must comment)([name] [place] (daughter/POA)) Choice offered to / list presented to : Adult Children([name] Boivin (daughter/POA))  Discharge Placement              Patient chooses bed at: [facility] Patient to be transferred to facility by: [facility] Name of family member notified: Rogelia Mire Patient and family notified of of transfer: 01/21/19  Discharge Plan and Services In-house Referral: Clinical Social Work Discharge Planning Services: CM Consult Post Acute Care Choice: ([place])          DME Arranged: N/A DME Agency: NA       HH Arranged: NA HH Agency: NA        Social Determinants of Health (SDOH) Interventions     Readmission Risk Interventions No flowsheet data found.

## 2019-01-21 NOTE — Plan of Care (Signed)
  Problem: Safety: Goal: Ability to remain free from injury will improve Outcome: Progressing   Problem: Elimination: Goal: Will not experience complications related to bowel motility Outcome: Progressing   Problem: Elimination: Goal: Will not experience complications related to urinary retention Outcome: Progressing   Problem: Nutrition: Goal: Adequate nutrition will be maintained Outcome: Progressing

## 2019-01-21 NOTE — Plan of Care (Signed)
  Problem: Education: Goal: Knowledge of General Education information will improve Description: Including pain rating scale, medication(s)/side effects and non-pharmacologic comfort measures 01/21/2019 0736 by Erling Conte, RN Outcome: Progressing 01/21/2019 0736 by Erling Conte, RN Outcome: Progressing   Problem: Health Behavior/Discharge Planning: Goal: Ability to manage health-related needs will improve 01/21/2019 0736 by Erling Conte, RN Outcome: Progressing 01/21/2019 0736 by Erling Conte, RN Outcome: Progressing   Problem: Clinical Measurements: Goal: Ability to maintain clinical measurements within normal limits will improve 01/21/2019 0736 by Erling Conte, RN Outcome: Progressing 01/21/2019 0736 by Erling Conte, RN Outcome: Progressing Goal: Will remain free from infection 01/21/2019 0736 by Erling Conte, RN Outcome: Progressing 01/21/2019 0736 by Erling Conte, RN Outcome: Progressing Goal: Diagnostic test results will improve 01/21/2019 0736 by Erling Conte, RN Outcome: Progressing 01/21/2019 0736 by Erling Conte, RN Outcome: Progressing Goal: Respiratory complications will improve 01/21/2019 0736 by Erling Conte, RN Outcome: Progressing 01/21/2019 0736 by Erling Conte, RN Outcome: Progressing Goal: Cardiovascular complication will be avoided 01/21/2019 0736 by Erling Conte, RN Outcome: Progressing 01/21/2019 0736 by Erling Conte, RN Outcome: Progressing   Problem: Activity: Goal: Risk for activity intolerance will decrease 01/21/2019 0736 by Erling Conte, RN Outcome: Progressing 01/21/2019 0736 by Erling Conte, RN Outcome: Progressing   Problem: Nutrition: Goal: Adequate nutrition will be maintained 01/21/2019 0736 by Erling Conte, RN Outcome: Progressing 01/21/2019 0736 by Erling Conte, RN Outcome: Progressing   Problem: Coping: Goal: Level  of anxiety will decrease 01/21/2019 0736 by Erling Conte, RN Outcome: Progressing 01/21/2019 0736 by Erling Conte, RN Outcome: Progressing   Problem: Elimination: Goal: Will not experience complications related to bowel motility 01/21/2019 0736 by Erling Conte, RN Outcome: Progressing 01/21/2019 0736 by Erling Conte, RN Outcome: Progressing Goal: Will not experience complications related to urinary retention 01/21/2019 0736 by Erling Conte, RN Outcome: Progressing 01/21/2019 0736 by Erling Conte, RN Outcome: Progressing   Problem: Pain Managment: Goal: General experience of comfort will improve 01/21/2019 0736 by Erling Conte, RN Outcome: Progressing 01/21/2019 0736 by Erling Conte, RN Outcome: Progressing   Problem: Safety: Goal: Ability to remain free from injury will improve 01/21/2019 0736 by Erling Conte, RN Outcome: Progressing 01/21/2019 0736 by Erling Conte, RN Outcome: Progressing   Problem: Skin Integrity: Goal: Risk for impaired skin integrity will decrease 01/21/2019 0736 by Erling Conte, RN Outcome: Progressing 01/21/2019 0736 by Erling Conte, RN Outcome: Progressing

## 2019-01-21 NOTE — Progress Notes (Signed)
Patient to leave via PTAR. She is alert and agreeable to transfer. Report called to [place] at [facility]

## 2019-01-21 NOTE — Plan of Care (Signed)
Problem: Education: Goal: Knowledge of General Education information will improve Description: Including pain rating scale, medication(s)/side effects and non-pharmacologic comfort measures 01/21/2019 1236 by Erling Conte, RN Outcome: Adequate for Discharge 01/21/2019 0736 by Erling Conte, RN Outcome: Progressing 01/21/2019 0736 by Erling Conte, RN Outcome: Progressing   Problem: Health Behavior/Discharge Planning: Goal: Ability to manage health-related needs will improve 01/21/2019 1236 by Erling Conte, RN Outcome: Adequate for Discharge 01/21/2019 0736 by Erling Conte, RN Outcome: Progressing 01/21/2019 0736 by Erling Conte, RN Outcome: Progressing   Problem: Clinical Measurements: Goal: Ability to maintain clinical measurements within normal limits will improve 01/21/2019 1236 by Erling Conte, RN Outcome: Adequate for Discharge 01/21/2019 0736 by Erling Conte, RN Outcome: Progressing 01/21/2019 0736 by Erling Conte, RN Outcome: Progressing Goal: Will remain free from infection 01/21/2019 1236 by Erling Conte, RN Outcome: Adequate for Discharge 01/21/2019 0736 by Erling Conte, RN Outcome: Progressing 01/21/2019 0736 by Erling Conte, RN Outcome: Progressing Goal: Diagnostic test results will improve 01/21/2019 1236 by Erling Conte, RN Outcome: Adequate for Discharge 01/21/2019 0736 by Erling Conte, RN Outcome: Progressing 01/21/2019 0736 by Erling Conte, RN Outcome: Progressing Goal: Respiratory complications will improve 01/21/2019 1236 by Erling Conte, RN Outcome: Adequate for Discharge 01/21/2019 0736 by Erling Conte, RN Outcome: Progressing 01/21/2019 0736 by Erling Conte, RN Outcome: Progressing Goal: Cardiovascular complication will be avoided 01/21/2019 1236 by Erling Conte, RN Outcome: Adequate for Discharge 01/21/2019 0736 by Erling Conte, RN Outcome: Progressing 01/21/2019 0736 by Erling Conte, RN Outcome: Progressing   Problem: Activity: Goal: Risk for activity intolerance will decrease 01/21/2019 1236 by Erling Conte, RN Outcome: Adequate for Discharge 01/21/2019 [phone] by Erling Conte, RN Outcome: Progressing 01/21/2019 0736 by Erling Conte, RN Outcome: Progressing   Problem: Nutrition: Goal: Adequate nutrition will be maintained 01/21/2019 1236 by Erling Conte, RN Outcome: Adequate for Discharge 01/21/2019 0736 by Erling Conte, RN Outcome: Progressing 01/21/2019 0736 by Erling Conte, RN Outcome: Progressing   Problem: Coping: Goal: Level of anxiety will decrease 01/21/2019 1236 by Erling Conte, RN Outcome: Adequate for Discharge 01/21/2019 0736 by Erling Conte, RN Outcome: Progressing 01/21/2019 0736 by Erling Conte, RN Outcome: Progressing   Problem: Elimination: Goal: Will not experience complications related to bowel motility 01/21/2019 1236 by Erling Conte, RN Outcome: Adequate for Discharge 01/21/2019 0736 by Erling Conte, RN Outcome: Progressing 01/21/2019 0736 by Erling Conte, RN Outcome: Progressing Goal: Will not experience complications related to urinary retention 01/21/2019 1236 by Erling Conte, RN Outcome: Adequate for Discharge 01/21/2019 0736 by Erling Conte, RN Outcome: Progressing 01/21/2019 0736 by Erling Conte, RN Outcome: Progressing   Problem: Pain Managment: Goal: General experience of comfort will improve 01/21/2019 1236 by Erling Conte, RN Outcome: Adequate for Discharge 01/21/2019 0736 by Erling Conte, RN Outcome: Progressing 01/21/2019 0736 by Erling Conte, RN Outcome: Progressing   Problem: Safety: Goal: Ability to remain free from injury will improve 01/21/2019 1236 by Erling Conte, RN Outcome: Adequate for Discharge 01/21/2019 0736 by  Erling Conte, RN Outcome: Progressing 01/21/2019 0736 by Erling Conte, RN Outcome: Progressing   Problem: Skin Integrity: Goal: Risk for impaired skin integrity will decrease 01/21/2019 1236 by Erling Conte, RN Outcome: Adequate for Discharge 01/21/2019 0736 by Erling Conte, RN Outcome: Progressing 01/21/2019 0736 by Erling Conte, RN  Outcome: Progressing   

## 2019-01-21 NOTE — Care Management Important Message (Signed)
Important Message  Patient Details  Name: Joann [name] MRN: [id number] Date of Birth: [birth year]   Medicare Important Message Given:  Yes     Leesha Veno 01/21/2019, 9:20 AM

## 2019-01-25 DIAGNOSIS — R2681 Unsteadiness on feet: Secondary | ICD-10-CM | POA: Diagnosis not present

## 2019-01-25 DIAGNOSIS — M6281 Muscle weakness (generalized): Secondary | ICD-10-CM | POA: Diagnosis not present

## 2019-01-26 DIAGNOSIS — M6281 Muscle weakness (generalized): Secondary | ICD-10-CM | POA: Diagnosis not present

## 2019-01-26 DIAGNOSIS — R293 Abnormal posture: Secondary | ICD-10-CM | POA: Diagnosis not present

## 2019-01-27 ENCOUNTER — Inpatient Hospital Stay: Payer: Medicare Other

## 2019-01-27 DIAGNOSIS — M6281 Muscle weakness (generalized): Secondary | ICD-10-CM | POA: Diagnosis not present

## 2019-01-27 DIAGNOSIS — R2681 Unsteadiness on feet: Secondary | ICD-10-CM | POA: Diagnosis not present

## 2019-01-28 DIAGNOSIS — R293 Abnormal posture: Secondary | ICD-10-CM | POA: Diagnosis not present

## 2019-01-28 DIAGNOSIS — M6281 Muscle weakness (generalized): Secondary | ICD-10-CM | POA: Diagnosis not present

## 2019-01-29 DIAGNOSIS — R2681 Unsteadiness on feet: Secondary | ICD-10-CM | POA: Diagnosis not present

## 2019-01-29 DIAGNOSIS — R293 Abnormal posture: Secondary | ICD-10-CM | POA: Diagnosis not present

## 2019-01-29 DIAGNOSIS — M6281 Muscle weakness (generalized): Secondary | ICD-10-CM | POA: Diagnosis not present

## 2019-02-01 DIAGNOSIS — M6281 Muscle weakness (generalized): Secondary | ICD-10-CM | POA: Diagnosis not present

## 2019-02-01 DIAGNOSIS — D649 Anemia, unspecified: Secondary | ICD-10-CM | POA: Diagnosis not present

## 2019-02-01 DIAGNOSIS — R2681 Unsteadiness on feet: Secondary | ICD-10-CM | POA: Diagnosis not present

## 2019-02-01 DIAGNOSIS — E785 Hyperlipidemia, unspecified: Secondary | ICD-10-CM | POA: Diagnosis not present

## 2019-02-03 DIAGNOSIS — R293 Abnormal posture: Secondary | ICD-10-CM | POA: Diagnosis not present

## 2019-02-03 DIAGNOSIS — R2681 Unsteadiness on feet: Secondary | ICD-10-CM | POA: Diagnosis not present

## 2019-02-03 DIAGNOSIS — M6281 Muscle weakness (generalized): Secondary | ICD-10-CM | POA: Diagnosis not present

## 2019-02-05 DIAGNOSIS — R2681 Unsteadiness on feet: Secondary | ICD-10-CM | POA: Diagnosis not present

## 2019-02-05 DIAGNOSIS — M6281 Muscle weakness (generalized): Secondary | ICD-10-CM | POA: Diagnosis not present

## 2019-02-05 DIAGNOSIS — R293 Abnormal posture: Secondary | ICD-10-CM | POA: Diagnosis not present

## 2019-02-08 DIAGNOSIS — R2681 Unsteadiness on feet: Secondary | ICD-10-CM | POA: Diagnosis not present

## 2019-02-08 DIAGNOSIS — M6281 Muscle weakness (generalized): Secondary | ICD-10-CM | POA: Diagnosis not present

## 2019-02-08 DIAGNOSIS — R293 Abnormal posture: Secondary | ICD-10-CM | POA: Diagnosis not present

## 2019-02-08 DIAGNOSIS — E785 Hyperlipidemia, unspecified: Secondary | ICD-10-CM | POA: Diagnosis not present

## 2019-02-08 DIAGNOSIS — D649 Anemia, unspecified: Secondary | ICD-10-CM | POA: Diagnosis not present

## 2019-02-10 DIAGNOSIS — M6281 Muscle weakness (generalized): Secondary | ICD-10-CM | POA: Diagnosis not present

## 2019-02-10 DIAGNOSIS — R2681 Unsteadiness on feet: Secondary | ICD-10-CM | POA: Diagnosis not present

## 2019-02-10 DIAGNOSIS — R293 Abnormal posture: Secondary | ICD-10-CM | POA: Diagnosis not present

## 2019-02-12 DIAGNOSIS — R2681 Unsteadiness on feet: Secondary | ICD-10-CM | POA: Diagnosis not present

## 2019-02-12 DIAGNOSIS — R293 Abnormal posture: Secondary | ICD-10-CM | POA: Diagnosis not present

## 2019-02-12 DIAGNOSIS — M6281 Muscle weakness (generalized): Secondary | ICD-10-CM | POA: Diagnosis not present

## 2019-02-15 DIAGNOSIS — R2681 Unsteadiness on feet: Secondary | ICD-10-CM | POA: Diagnosis not present

## 2019-02-15 DIAGNOSIS — M6281 Muscle weakness (generalized): Secondary | ICD-10-CM | POA: Diagnosis not present

## 2019-02-15 DIAGNOSIS — R293 Abnormal posture: Secondary | ICD-10-CM | POA: Diagnosis not present

## 2019-02-17 DIAGNOSIS — M6281 Muscle weakness (generalized): Secondary | ICD-10-CM | POA: Diagnosis not present

## 2019-02-17 DIAGNOSIS — R2681 Unsteadiness on feet: Secondary | ICD-10-CM | POA: Diagnosis not present

## 2019-02-18 DIAGNOSIS — M6281 Muscle weakness (generalized): Secondary | ICD-10-CM | POA: Diagnosis not present

## 2019-02-18 DIAGNOSIS — R293 Abnormal posture: Secondary | ICD-10-CM | POA: Diagnosis not present

## 2019-02-19 DIAGNOSIS — M6281 Muscle weakness (generalized): Secondary | ICD-10-CM | POA: Diagnosis not present

## 2019-02-19 DIAGNOSIS — R2681 Unsteadiness on feet: Secondary | ICD-10-CM | POA: Diagnosis not present

## 2019-02-19 DIAGNOSIS — R293 Abnormal posture: Secondary | ICD-10-CM | POA: Diagnosis not present

## 2019-02-22 DIAGNOSIS — R293 Abnormal posture: Secondary | ICD-10-CM | POA: Diagnosis not present

## 2019-02-22 DIAGNOSIS — R2681 Unsteadiness on feet: Secondary | ICD-10-CM | POA: Diagnosis not present

## 2019-02-22 DIAGNOSIS — M6281 Muscle weakness (generalized): Secondary | ICD-10-CM | POA: Diagnosis not present

## 2019-02-23 DIAGNOSIS — R293 Abnormal posture: Secondary | ICD-10-CM | POA: Diagnosis not present

## 2019-02-23 DIAGNOSIS — M6281 Muscle weakness (generalized): Secondary | ICD-10-CM | POA: Diagnosis not present

## 2019-02-23 DIAGNOSIS — D649 Anemia, unspecified: Secondary | ICD-10-CM | POA: Diagnosis not present

## 2019-02-24 DIAGNOSIS — M6281 Muscle weakness (generalized): Secondary | ICD-10-CM | POA: Diagnosis not present

## 2019-02-24 DIAGNOSIS — R2681 Unsteadiness on feet: Secondary | ICD-10-CM | POA: Diagnosis not present

## 2019-02-25 DIAGNOSIS — R2681 Unsteadiness on feet: Secondary | ICD-10-CM | POA: Diagnosis not present

## 2019-02-25 DIAGNOSIS — M6281 Muscle weakness (generalized): Secondary | ICD-10-CM | POA: Diagnosis not present

## 2019-03-01 DIAGNOSIS — R609 Edema, unspecified: Secondary | ICD-10-CM | POA: Diagnosis not present

## 2019-03-23 DIAGNOSIS — M25559 Pain in unspecified hip: Secondary | ICD-10-CM | POA: Diagnosis not present

## 2019-03-23 DIAGNOSIS — M25552 Pain in left hip: Secondary | ICD-10-CM | POA: Diagnosis not present

## 2019-03-23 DIAGNOSIS — M166 Other bilateral secondary osteoarthritis of hip: Secondary | ICD-10-CM | POA: Diagnosis not present

## 2019-03-23 DIAGNOSIS — M25551 Pain in right hip: Secondary | ICD-10-CM | POA: Diagnosis not present

## 2019-03-29 DIAGNOSIS — M169 Osteoarthritis of hip, unspecified: Secondary | ICD-10-CM | POA: Diagnosis not present

## 2019-03-29 DIAGNOSIS — R609 Edema, unspecified: Secondary | ICD-10-CM | POA: Diagnosis not present

## 2019-04-12 DIAGNOSIS — R609 Edema, unspecified: Secondary | ICD-10-CM | POA: Diagnosis not present

## 2019-04-13 DIAGNOSIS — R278 Other lack of coordination: Secondary | ICD-10-CM | POA: Diagnosis not present

## 2019-04-13 DIAGNOSIS — M6281 Muscle weakness (generalized): Secondary | ICD-10-CM | POA: Diagnosis not present

## 2019-04-13 DIAGNOSIS — R293 Abnormal posture: Secondary | ICD-10-CM | POA: Diagnosis not present

## 2019-04-14 DIAGNOSIS — D649 Anemia, unspecified: Secondary | ICD-10-CM | POA: Diagnosis not present

## 2019-04-14 DIAGNOSIS — I10 Essential (primary) hypertension: Secondary | ICD-10-CM | POA: Diagnosis not present

## 2019-04-16 DIAGNOSIS — R2681 Unsteadiness on feet: Secondary | ICD-10-CM | POA: Diagnosis not present

## 2019-04-16 DIAGNOSIS — M6281 Muscle weakness (generalized): Secondary | ICD-10-CM | POA: Diagnosis not present

## 2019-04-16 DIAGNOSIS — R293 Abnormal posture: Secondary | ICD-10-CM | POA: Diagnosis not present

## 2019-04-16 DIAGNOSIS — I639 Cerebral infarction, unspecified: Secondary | ICD-10-CM | POA: Diagnosis not present

## 2019-04-16 DIAGNOSIS — R278 Other lack of coordination: Secondary | ICD-10-CM | POA: Diagnosis not present

## 2019-04-19 DIAGNOSIS — M6281 Muscle weakness (generalized): Secondary | ICD-10-CM | POA: Diagnosis not present

## 2019-04-19 DIAGNOSIS — I639 Cerebral infarction, unspecified: Secondary | ICD-10-CM | POA: Diagnosis not present

## 2019-04-19 DIAGNOSIS — R293 Abnormal posture: Secondary | ICD-10-CM | POA: Diagnosis not present

## 2019-04-19 DIAGNOSIS — R2681 Unsteadiness on feet: Secondary | ICD-10-CM | POA: Diagnosis not present

## 2019-04-19 DIAGNOSIS — R278 Other lack of coordination: Secondary | ICD-10-CM | POA: Diagnosis not present

## 2019-04-21 DIAGNOSIS — R278 Other lack of coordination: Secondary | ICD-10-CM | POA: Diagnosis not present

## 2019-04-21 DIAGNOSIS — R293 Abnormal posture: Secondary | ICD-10-CM | POA: Diagnosis not present

## 2019-04-21 DIAGNOSIS — I639 Cerebral infarction, unspecified: Secondary | ICD-10-CM | POA: Diagnosis not present

## 2019-04-21 DIAGNOSIS — M6281 Muscle weakness (generalized): Secondary | ICD-10-CM | POA: Diagnosis not present

## 2019-04-21 DIAGNOSIS — R2681 Unsteadiness on feet: Secondary | ICD-10-CM | POA: Diagnosis not present

## 2019-04-23 DIAGNOSIS — R293 Abnormal posture: Secondary | ICD-10-CM | POA: Diagnosis not present

## 2019-04-23 DIAGNOSIS — R278 Other lack of coordination: Secondary | ICD-10-CM | POA: Diagnosis not present

## 2019-04-23 DIAGNOSIS — I639 Cerebral infarction, unspecified: Secondary | ICD-10-CM | POA: Diagnosis not present

## 2019-04-23 DIAGNOSIS — M6281 Muscle weakness (generalized): Secondary | ICD-10-CM | POA: Diagnosis not present

## 2019-04-23 DIAGNOSIS — R2681 Unsteadiness on feet: Secondary | ICD-10-CM | POA: Diagnosis not present

## 2019-04-26 DIAGNOSIS — R2681 Unsteadiness on feet: Secondary | ICD-10-CM | POA: Diagnosis not present

## 2019-04-26 DIAGNOSIS — R293 Abnormal posture: Secondary | ICD-10-CM | POA: Diagnosis not present

## 2019-04-26 DIAGNOSIS — M6281 Muscle weakness (generalized): Secondary | ICD-10-CM | POA: Diagnosis not present

## 2019-04-26 DIAGNOSIS — R05 Cough: Secondary | ICD-10-CM | POA: Diagnosis not present

## 2019-04-26 DIAGNOSIS — I639 Cerebral infarction, unspecified: Secondary | ICD-10-CM | POA: Diagnosis not present

## 2019-04-26 DIAGNOSIS — R609 Edema, unspecified: Secondary | ICD-10-CM | POA: Diagnosis not present

## 2019-04-26 DIAGNOSIS — R278 Other lack of coordination: Secondary | ICD-10-CM | POA: Diagnosis not present

## 2019-04-28 DIAGNOSIS — D649 Anemia, unspecified: Secondary | ICD-10-CM | POA: Diagnosis not present

## 2019-04-28 DIAGNOSIS — R2681 Unsteadiness on feet: Secondary | ICD-10-CM | POA: Diagnosis not present

## 2019-04-28 DIAGNOSIS — I639 Cerebral infarction, unspecified: Secondary | ICD-10-CM | POA: Diagnosis not present

## 2019-04-28 DIAGNOSIS — R278 Other lack of coordination: Secondary | ICD-10-CM | POA: Diagnosis not present

## 2019-04-28 DIAGNOSIS — M6281 Muscle weakness (generalized): Secondary | ICD-10-CM | POA: Diagnosis not present

## 2019-04-28 DIAGNOSIS — R293 Abnormal posture: Secondary | ICD-10-CM | POA: Diagnosis not present

## 2019-04-28 DIAGNOSIS — I10 Essential (primary) hypertension: Secondary | ICD-10-CM | POA: Diagnosis not present

## 2019-04-30 DIAGNOSIS — M6281 Muscle weakness (generalized): Secondary | ICD-10-CM | POA: Diagnosis not present

## 2019-04-30 DIAGNOSIS — I639 Cerebral infarction, unspecified: Secondary | ICD-10-CM | POA: Diagnosis not present

## 2019-04-30 DIAGNOSIS — R293 Abnormal posture: Secondary | ICD-10-CM | POA: Diagnosis not present

## 2019-04-30 DIAGNOSIS — R278 Other lack of coordination: Secondary | ICD-10-CM | POA: Diagnosis not present

## 2019-04-30 DIAGNOSIS — R2681 Unsteadiness on feet: Secondary | ICD-10-CM | POA: Diagnosis not present

## 2019-05-03 DIAGNOSIS — R278 Other lack of coordination: Secondary | ICD-10-CM | POA: Diagnosis not present

## 2019-05-03 DIAGNOSIS — R293 Abnormal posture: Secondary | ICD-10-CM | POA: Diagnosis not present

## 2019-05-03 DIAGNOSIS — M6281 Muscle weakness (generalized): Secondary | ICD-10-CM | POA: Diagnosis not present

## 2019-05-03 DIAGNOSIS — I639 Cerebral infarction, unspecified: Secondary | ICD-10-CM | POA: Diagnosis not present

## 2019-05-03 DIAGNOSIS — R2681 Unsteadiness on feet: Secondary | ICD-10-CM | POA: Diagnosis not present

## 2019-05-04 DIAGNOSIS — I639 Cerebral infarction, unspecified: Secondary | ICD-10-CM | POA: Diagnosis not present

## 2019-05-04 DIAGNOSIS — R2681 Unsteadiness on feet: Secondary | ICD-10-CM | POA: Diagnosis not present

## 2019-05-04 DIAGNOSIS — M6281 Muscle weakness (generalized): Secondary | ICD-10-CM | POA: Diagnosis not present

## 2019-05-05 DIAGNOSIS — R293 Abnormal posture: Secondary | ICD-10-CM | POA: Diagnosis not present

## 2019-05-05 DIAGNOSIS — R278 Other lack of coordination: Secondary | ICD-10-CM | POA: Diagnosis not present

## 2019-05-05 DIAGNOSIS — M6281 Muscle weakness (generalized): Secondary | ICD-10-CM | POA: Diagnosis not present

## 2019-05-07 DIAGNOSIS — M6281 Muscle weakness (generalized): Secondary | ICD-10-CM | POA: Diagnosis not present

## 2019-05-07 DIAGNOSIS — R2681 Unsteadiness on feet: Secondary | ICD-10-CM | POA: Diagnosis not present

## 2019-05-07 DIAGNOSIS — R278 Other lack of coordination: Secondary | ICD-10-CM | POA: Diagnosis not present

## 2019-05-07 DIAGNOSIS — I639 Cerebral infarction, unspecified: Secondary | ICD-10-CM | POA: Diagnosis not present

## 2019-05-07 DIAGNOSIS — R293 Abnormal posture: Secondary | ICD-10-CM | POA: Diagnosis not present

## 2019-05-10 DIAGNOSIS — R293 Abnormal posture: Secondary | ICD-10-CM | POA: Diagnosis not present

## 2019-05-10 DIAGNOSIS — M6281 Muscle weakness (generalized): Secondary | ICD-10-CM | POA: Diagnosis not present

## 2019-05-10 DIAGNOSIS — I639 Cerebral infarction, unspecified: Secondary | ICD-10-CM | POA: Diagnosis not present

## 2019-05-10 DIAGNOSIS — R278 Other lack of coordination: Secondary | ICD-10-CM | POA: Diagnosis not present

## 2019-05-10 DIAGNOSIS — R2681 Unsteadiness on feet: Secondary | ICD-10-CM | POA: Diagnosis not present

## 2019-05-12 DIAGNOSIS — R2681 Unsteadiness on feet: Secondary | ICD-10-CM | POA: Diagnosis not present

## 2019-05-12 DIAGNOSIS — R293 Abnormal posture: Secondary | ICD-10-CM | POA: Diagnosis not present

## 2019-05-12 DIAGNOSIS — R278 Other lack of coordination: Secondary | ICD-10-CM | POA: Diagnosis not present

## 2019-05-12 DIAGNOSIS — M6281 Muscle weakness (generalized): Secondary | ICD-10-CM | POA: Diagnosis not present

## 2019-05-12 DIAGNOSIS — I639 Cerebral infarction, unspecified: Secondary | ICD-10-CM | POA: Diagnosis not present

## 2019-05-14 DIAGNOSIS — R278 Other lack of coordination: Secondary | ICD-10-CM | POA: Diagnosis not present

## 2019-05-14 DIAGNOSIS — R2681 Unsteadiness on feet: Secondary | ICD-10-CM | POA: Diagnosis not present

## 2019-05-14 DIAGNOSIS — R293 Abnormal posture: Secondary | ICD-10-CM | POA: Diagnosis not present

## 2019-05-14 DIAGNOSIS — I639 Cerebral infarction, unspecified: Secondary | ICD-10-CM | POA: Diagnosis not present

## 2019-05-14 DIAGNOSIS — M6281 Muscle weakness (generalized): Secondary | ICD-10-CM | POA: Diagnosis not present

## 2019-05-17 DIAGNOSIS — R293 Abnormal posture: Secondary | ICD-10-CM | POA: Diagnosis not present

## 2019-05-17 DIAGNOSIS — M6281 Muscle weakness (generalized): Secondary | ICD-10-CM | POA: Diagnosis not present

## 2019-05-17 DIAGNOSIS — I639 Cerebral infarction, unspecified: Secondary | ICD-10-CM | POA: Diagnosis not present

## 2019-05-17 DIAGNOSIS — R2681 Unsteadiness on feet: Secondary | ICD-10-CM | POA: Diagnosis not present

## 2019-05-17 DIAGNOSIS — R278 Other lack of coordination: Secondary | ICD-10-CM | POA: Diagnosis not present

## 2019-05-18 DIAGNOSIS — M6281 Muscle weakness (generalized): Secondary | ICD-10-CM | POA: Diagnosis not present

## 2019-05-18 DIAGNOSIS — I639 Cerebral infarction, unspecified: Secondary | ICD-10-CM | POA: Diagnosis not present

## 2019-05-18 DIAGNOSIS — R2681 Unsteadiness on feet: Secondary | ICD-10-CM | POA: Diagnosis not present

## 2019-05-19 DIAGNOSIS — M6281 Muscle weakness (generalized): Secondary | ICD-10-CM | POA: Diagnosis not present

## 2019-05-19 DIAGNOSIS — R293 Abnormal posture: Secondary | ICD-10-CM | POA: Diagnosis not present

## 2019-05-19 DIAGNOSIS — R278 Other lack of coordination: Secondary | ICD-10-CM | POA: Diagnosis not present

## 2019-05-21 DIAGNOSIS — M6281 Muscle weakness (generalized): Secondary | ICD-10-CM | POA: Diagnosis not present

## 2019-05-21 DIAGNOSIS — R293 Abnormal posture: Secondary | ICD-10-CM | POA: Diagnosis not present

## 2019-05-21 DIAGNOSIS — R278 Other lack of coordination: Secondary | ICD-10-CM | POA: Diagnosis not present

## 2019-05-24 DIAGNOSIS — R293 Abnormal posture: Secondary | ICD-10-CM | POA: Diagnosis not present

## 2019-05-24 DIAGNOSIS — R278 Other lack of coordination: Secondary | ICD-10-CM | POA: Diagnosis not present

## 2019-05-24 DIAGNOSIS — M6281 Muscle weakness (generalized): Secondary | ICD-10-CM | POA: Diagnosis not present

## 2019-05-26 DIAGNOSIS — M6281 Muscle weakness (generalized): Secondary | ICD-10-CM | POA: Diagnosis not present

## 2019-05-26 DIAGNOSIS — R293 Abnormal posture: Secondary | ICD-10-CM | POA: Diagnosis not present

## 2019-05-26 DIAGNOSIS — R278 Other lack of coordination: Secondary | ICD-10-CM | POA: Diagnosis not present

## 2019-05-28 DIAGNOSIS — R293 Abnormal posture: Secondary | ICD-10-CM | POA: Diagnosis not present

## 2019-05-28 DIAGNOSIS — M6281 Muscle weakness (generalized): Secondary | ICD-10-CM | POA: Diagnosis not present

## 2019-05-28 DIAGNOSIS — R278 Other lack of coordination: Secondary | ICD-10-CM | POA: Diagnosis not present

## 2019-05-31 DIAGNOSIS — R278 Other lack of coordination: Secondary | ICD-10-CM | POA: Diagnosis not present

## 2019-05-31 DIAGNOSIS — M6281 Muscle weakness (generalized): Secondary | ICD-10-CM | POA: Diagnosis not present

## 2019-05-31 DIAGNOSIS — R293 Abnormal posture: Secondary | ICD-10-CM | POA: Diagnosis not present

## 2019-06-02 DIAGNOSIS — M6281 Muscle weakness (generalized): Secondary | ICD-10-CM | POA: Diagnosis not present

## 2019-06-02 DIAGNOSIS — R293 Abnormal posture: Secondary | ICD-10-CM | POA: Diagnosis not present

## 2019-06-02 DIAGNOSIS — R278 Other lack of coordination: Secondary | ICD-10-CM | POA: Diagnosis not present

## 2019-09-06 DIAGNOSIS — D649 Anemia, unspecified: Secondary | ICD-10-CM | POA: Diagnosis not present

## 2019-09-06 DIAGNOSIS — Z720 Tobacco use: Secondary | ICD-10-CM | POA: Diagnosis not present

## 2020-01-03 DIAGNOSIS — Z720 Tobacco use: Secondary | ICD-10-CM | POA: Diagnosis not present

## 2020-02-16 DIAGNOSIS — Z03818 Encounter for observation for suspected exposure to other biological agents ruled out: Secondary | ICD-10-CM | POA: Diagnosis not present

## 2020-03-25 ENCOUNTER — Inpatient Hospital Stay (HOSPITAL_COMMUNITY)
Admission: EM | Admit: 2020-03-25 | Discharge: 2020-03-29 | DRG: 481 | Disposition: A | Discharge status: Skilled Nursing Facility | Payer: Medicare Other | Source: Skilled Nursing Facility | Attending: Family Medicine | Admitting: Family Medicine

## 2020-03-25 ENCOUNTER — Emergency Department (HOSPITAL_COMMUNITY): Payer: Medicare Other

## 2020-03-25 ENCOUNTER — Other Ambulatory Visit: Payer: Self-pay

## 2020-03-25 DIAGNOSIS — M810 Age-related osteoporosis without current pathological fracture: Secondary | ICD-10-CM | POA: Diagnosis not present

## 2020-03-25 DIAGNOSIS — M6281 Muscle weakness (generalized): Secondary | ICD-10-CM | POA: Diagnosis not present

## 2020-03-25 DIAGNOSIS — G309 Alzheimer's disease, unspecified: Secondary | ICD-10-CM | POA: Diagnosis present

## 2020-03-25 DIAGNOSIS — Z809 Family history of malignant neoplasm, unspecified: Secondary | ICD-10-CM

## 2020-03-25 DIAGNOSIS — M159 Polyosteoarthritis, unspecified: Secondary | ICD-10-CM | POA: Diagnosis not present

## 2020-03-25 DIAGNOSIS — Z8614 Personal history of Methicillin resistant Staphylococcus aureus infection: Secondary | ICD-10-CM | POA: Diagnosis not present

## 2020-03-25 DIAGNOSIS — Z20822 Contact with and (suspected) exposure to covid-19: Secondary | ICD-10-CM | POA: Diagnosis not present

## 2020-03-25 DIAGNOSIS — R404 Transient alteration of awareness: Secondary | ICD-10-CM | POA: Diagnosis not present

## 2020-03-25 DIAGNOSIS — Z66 Do not resuscitate: Secondary | ICD-10-CM | POA: Diagnosis not present

## 2020-03-25 DIAGNOSIS — E7800 Pure hypercholesterolemia, unspecified: Secondary | ICD-10-CM | POA: Diagnosis not present

## 2020-03-25 DIAGNOSIS — W010XXA Fall on same level from slipping, tripping and stumbling without subsequent striking against object, initial encounter: Secondary | ICD-10-CM | POA: Diagnosis present

## 2020-03-25 DIAGNOSIS — Z90710 Acquired absence of both cervix and uterus: Secondary | ICD-10-CM

## 2020-03-25 DIAGNOSIS — F0280 Dementia in other diseases classified elsewhere without behavioral disturbance: Secondary | ICD-10-CM | POA: Diagnosis present

## 2020-03-25 DIAGNOSIS — I872 Venous insufficiency (chronic) (peripheral): Secondary | ICD-10-CM | POA: Diagnosis present

## 2020-03-25 DIAGNOSIS — F17200 Nicotine dependence, unspecified, uncomplicated: Secondary | ICD-10-CM | POA: Diagnosis not present

## 2020-03-25 DIAGNOSIS — Z743 Need for continuous supervision: Secondary | ICD-10-CM | POA: Diagnosis not present

## 2020-03-25 DIAGNOSIS — W19XXXA Unspecified fall, initial encounter: Secondary | ICD-10-CM

## 2020-03-25 DIAGNOSIS — E559 Vitamin D deficiency, unspecified: Secondary | ICD-10-CM | POA: Diagnosis not present

## 2020-03-25 DIAGNOSIS — R54 Age-related physical debility: Secondary | ICD-10-CM | POA: Diagnosis not present

## 2020-03-25 DIAGNOSIS — F17210 Nicotine dependence, cigarettes, uncomplicated: Secondary | ICD-10-CM | POA: Diagnosis present

## 2020-03-25 DIAGNOSIS — R6889 Other general symptoms and signs: Secondary | ICD-10-CM | POA: Diagnosis not present

## 2020-03-25 DIAGNOSIS — Z7189 Other specified counseling: Secondary | ICD-10-CM | POA: Diagnosis not present

## 2020-03-25 DIAGNOSIS — Z8781 Personal history of (healed) traumatic fracture: Secondary | ICD-10-CM | POA: Diagnosis not present

## 2020-03-25 DIAGNOSIS — Z515 Encounter for palliative care: Secondary | ICD-10-CM | POA: Diagnosis not present

## 2020-03-25 DIAGNOSIS — R9431 Abnormal electrocardiogram [ECG] [EKG]: Secondary | ICD-10-CM | POA: Diagnosis not present

## 2020-03-25 DIAGNOSIS — E440 Moderate protein-calorie malnutrition: Secondary | ICD-10-CM | POA: Diagnosis not present

## 2020-03-25 DIAGNOSIS — Z043 Encounter for examination and observation following other accident: Secondary | ICD-10-CM | POA: Diagnosis not present

## 2020-03-25 DIAGNOSIS — R262 Difficulty in walking, not elsewhere classified: Secondary | ICD-10-CM | POA: Diagnosis not present

## 2020-03-25 DIAGNOSIS — Z7401 Bed confinement status: Secondary | ICD-10-CM | POA: Diagnosis not present

## 2020-03-25 DIAGNOSIS — S72144D Nondisplaced intertrochanteric fracture of right femur, subsequent encounter for closed fracture with routine healing: Secondary | ICD-10-CM | POA: Diagnosis not present

## 2020-03-25 DIAGNOSIS — W19XXXD Unspecified fall, subsequent encounter: Secondary | ICD-10-CM | POA: Diagnosis not present

## 2020-03-25 DIAGNOSIS — Z841 Family history of disorders of kidney and ureter: Secondary | ICD-10-CM

## 2020-03-25 DIAGNOSIS — F0150 Vascular dementia without behavioral disturbance: Secondary | ICD-10-CM | POA: Diagnosis not present

## 2020-03-25 DIAGNOSIS — D61818 Other pancytopenia: Secondary | ICD-10-CM | POA: Diagnosis not present

## 2020-03-25 DIAGNOSIS — G629 Polyneuropathy, unspecified: Secondary | ICD-10-CM | POA: Diagnosis present

## 2020-03-25 DIAGNOSIS — Z8673 Personal history of transient ischemic attack (TIA), and cerebral infarction without residual deficits: Secondary | ICD-10-CM

## 2020-03-25 DIAGNOSIS — S72141D Displaced intertrochanteric fracture of right femur, subsequent encounter for closed fracture with routine healing: Secondary | ICD-10-CM | POA: Diagnosis not present

## 2020-03-25 DIAGNOSIS — E785 Hyperlipidemia, unspecified: Secondary | ICD-10-CM | POA: Diagnosis present

## 2020-03-25 DIAGNOSIS — Z6820 Body mass index (BMI) 20.0-20.9, adult: Secondary | ICD-10-CM | POA: Diagnosis not present

## 2020-03-25 DIAGNOSIS — S72141A Displaced intertrochanteric fracture of right femur, initial encounter for closed fracture: Secondary | ICD-10-CM | POA: Diagnosis not present

## 2020-03-25 DIAGNOSIS — M2550 Pain in unspecified joint: Secondary | ICD-10-CM | POA: Diagnosis not present

## 2020-03-25 DIAGNOSIS — D510 Vitamin B12 deficiency anemia due to intrinsic factor deficiency: Secondary | ICD-10-CM | POA: Diagnosis present

## 2020-03-25 DIAGNOSIS — M80851A Other osteoporosis with current pathological fracture, right femur, initial encounter for fracture: Principal | ICD-10-CM | POA: Diagnosis present

## 2020-03-25 DIAGNOSIS — Y92129 Unspecified place in nursing home as the place of occurrence of the external cause: Secondary | ICD-10-CM

## 2020-03-25 DIAGNOSIS — R1311 Dysphagia, oral phase: Secondary | ICD-10-CM | POA: Diagnosis not present

## 2020-03-25 DIAGNOSIS — E538 Deficiency of other specified B group vitamins: Secondary | ICD-10-CM | POA: Diagnosis not present

## 2020-03-25 LAB — BASIC METABOLIC PANEL
Anion gap: 11 (ref 5–15)
BUN: 21 mg/dL (ref 8–23)
CO2: 25 mmol/L (ref 22–32)
Calcium: 9.2 mg/dL (ref 8.9–10.3)
Chloride: 100 mmol/L (ref 98–111)
Creatinine, Ser: 1.08 mg/dL — ABNORMAL HIGH (ref 0.44–1.00)
GFR, Estimated: 49 mL/min — ABNORMAL LOW (ref >60)
Glucose, Bld: 128 mg/dL — ABNORMAL HIGH (ref 70–99)
Potassium: 4.4 mmol/L (ref 3.5–5.1)
Sodium: 136 mmol/L (ref 135–145)

## 2020-03-25 LAB — CBC WITH DIFFERENTIAL/PLATELET
Abs Immature Granulocytes: 0.02 10*3/uL (ref 0.00–0.07)
Basophils Absolute: 0 10*3/uL (ref 0.0–0.1)
Basophils Relative: 0 %
Eosinophils Absolute: 0.1 10*3/uL (ref 0.0–0.5)
Eosinophils Relative: 2 %
HCT: 39.6 % (ref 36.0–46.0)
Hemoglobin: 12.6 g/dL (ref 12.0–15.0)
Immature Granulocytes: 0 %
Lymphocytes Relative: 19 %
Lymphs Abs: 1.2 10*3/uL (ref 0.7–4.0)
MCH: 28 pg (ref 26.0–34.0)
MCHC: 31.8 g/dL (ref 30.0–36.0)
MCV: 88 fL (ref 80.0–100.0)
Monocytes Absolute: 0.8 10*3/uL (ref 0.1–1.0)
Monocytes Relative: 12 %
Neutro Abs: 4.2 10*3/uL (ref 1.7–7.7)
Neutrophils Relative %: 67 %
Platelets: 294 10*3/uL (ref 150–400)
RBC: 4.5 MIL/uL (ref 3.87–5.11)
RDW: 12.5 % (ref 11.5–15.5)
WBC: 6.3 10*3/uL (ref 4.0–10.5)
nRBC: 0 % (ref 0.0–0.2)

## 2020-03-25 LAB — TYPE AND SCREEN
ABO/RH(D): A POS
Antibody Screen: NEGATIVE

## 2020-03-25 LAB — PROTIME-INR
INR: 1 (ref 0.8–1.2)
Prothrombin Time: 12.7 seconds (ref 11.4–15.2)

## 2020-03-25 MED ORDER — POLYETHYLENE GLYCOL 3350 17 G PO PACK
17.0000 g | PACK | Freq: Every day | ORAL | Status: DC | PRN
  Administered 2020-03-29: 17 g via ORAL
  Filled 2020-03-25: qty 1

## 2020-03-25 MED ORDER — FENTANYL CITRATE (PF) 100 MCG/2ML IJ SOLN
25.0000 ug | INTRAMUSCULAR | Status: DC | PRN
  Administered 2020-03-25: 25 ug via INTRAVENOUS
  Filled 2020-03-25: qty 2

## 2020-03-25 MED ORDER — MORPHINE SULFATE (PF) 2 MG/ML IV SOLN: 0.5000 mg | INTRAVENOUS | Status: DC | PRN

## 2020-03-25 MED ORDER — ONDANSETRON HCL 4 MG/2ML IJ SOLN: 4.0000 mg | Freq: Once | INTRAMUSCULAR | Status: DC

## 2020-03-25 MED ORDER — CALCIUM CARBONATE 1250 (500 CA) MG PO TABS
1.0000 | ORAL_TABLET | Freq: Two times a day (BID) | ORAL | Status: DC
  Administered 2020-03-26 – 2020-03-29 (×7): 500 mg via ORAL
  Filled 2020-03-25 (×8): qty 1

## 2020-03-25 NOTE — ED Provider Notes (Signed)
[facility] EMERGENCY DEPARTMENT Provider Note   CSN: [id number] Arrival date & time: 03/25/20  1853     History No chief complaint on file.   Joann [name] is a 87 y.o. female who presents after witnessed fall from [facility]. She has dementia but is baseline. Patient c/o R thigh pain. She slipped using her walker. She did not hit her head or lose consciousness. She does not take blood thinners.   HPI     Past Medical History:  Diagnosis Date  . ANEMIA, PERNICIOUS 09/21/2007   Qualifier: Diagnosis of  By: Humberto Seals NP, Darl Pikes    . Benign Ethnic Leukopenia 04/28/2015  . Dizziness 07/10/2014  . Hip osteoarthritis 09/24/2010  . History of anemia 10/05/2014  . Hypercholesteremia   . HYPERLIPIDEMIA 03/27/2006   Qualifier: Diagnosis of  By: Levada Schilling    . KNEE PAIN, LEFT, CHRONIC 02/02/2009   Qualifier: Diagnosis of  By: Clotilde Dieter MD, Amber    . Leukopenia 03/23/2015  . Mixed Alzheimer's and vascular dementia (HCC) 03/23/2015  . MRSA (methicillin resistant staph aureus) culture positive 06/21/2011  . NEUROPATHY, PERIPHERAL 03/27/2006   Qualifier: Diagnosis of  By: Levada Schilling    . OSTEOPENIA 03/27/2006  . Stroke, lacunar (HCC) 04/28/2015  . UTI (urinary tract infection) 03/23/2015  . Vitamin B12 deficiency 04/28/2015  . Vitamin D deficiency 06/13/2011    Patient Active Problem List   Diagnosis Date Noted  . Malnutrition of moderate degree 01/20/2019  . Closed fracture of left inferior pubic ramus (HCC) 01/16/2019  . Anemia 09/01/2018  . Increased sleeping 08/26/2018  . Swollen ankles 11/09/2015  . Vitamin B12 deficiency 04/28/2015  . Benign Ethnic Leukopenia 04/28/2015  . Stroke, lacunar (HCC) 04/28/2015  . Mixed Alzheimer's and vascular dementia (HCC) 03/23/2015  . Memory loss of unknown cause 10/05/2014  . Vitamin D deficiency 06/13/2011  . KNEE PAIN, LEFT, CHRONIC 02/02/2009  . ANEMIA, PERNICIOUS 09/21/2007  . HYPERLIPIDEMIA 03/27/2006  . TOBACCO DEPENDENCE  03/27/2006  . NEUROPATHY, PERIPHERAL 03/27/2006  . OSTEOPENIA 03/27/2006    Past Surgical History:  Procedure Laterality Date  . ABDOMINAL HYSTERECTOMY    . INCISION AND DRAINAGE PERIRECTAL ABSCESS  06/16/2011   Procedure: IRRIGATION AND DEBRIDEMENT PERIRECTAL ABSCESS;  Surgeon: Robyne Askew, MD;  Location: [facility] OR;  Service: General;  Laterality: N/A;     OB History   No obstetric history on file.     Family History  Problem Relation Age of Onset  . Kidney disease Mother   . Cancer Sister        colon  . Cancer Brother        lung  . Cancer Sister        stomach  . Dementia Sister   . Dementia Sister   . Dementia Sister   . Dementia Brother     Social History   Tobacco Use  . Smoking status: Current Every Day Smoker    Packs/day: 0.50    Types: Cigarettes    Last attempt to quit: 06/15/2011    Years since quitting: 8.7  . Smokeless tobacco: Never Used  Substance Use Topics  . Alcohol use: No  . Drug use: No    Home Medications Prior to Admission medications   Medication Sig Start Date End Date Taking? Authorizing Provider  acetaminophen (TYLENOL) 500 MG tablet Take 500 mg by mouth every 6 (six) hours as needed (for a fever of 99.5-101 F and monitor until normal or for headaches &  minor discomfort- CANNOT EXCEED 2,000 MG/24 HOURS).    [provider]  alum & mag hydroxide-simeth (MI-ACID) 200-200-20 MG/5ML suspension Take 30 mLs by mouth every 6 (six) hours as needed for indigestion or heartburn (NOT TO EXCEED 4 DOSES/24 HOURS).    [provider]  calcium gluconate 500 MG tablet Take 1 tablet (500 mg total) by mouth 3 (three) times daily. 01/21/19   Marthenia Rolling, DO  guaifenesin (ROBAFEN) 100 MG/5ML syrup Take 200 mg by mouth every 6 (six) hours as needed for cough (NOT TO EXCEED 4 DOSES/24 HOURS).    [provider]  loperamide (IMODIUM) 2 MG capsule Take 2 mg by mouth as needed (with each loose stool or diarrhea- cannot exceed 8  doses/24 hours).    [provider]  magnesium hydroxide (MILK OF MAGNESIA) 400 MG/5ML suspension Take 30 mLs by mouth at bedtime as needed for mild constipation.    [provider]  Neomycin-Bacitracin-Polymyxin (TRIPLE ANTIBIOTIC) [phone] OINT Apply 1 application topically See admin instructions. Apply to affected area(s) after cleaning with normal saline, then cover with gauze and tape or Band-Aid(s)- until healed    [provider]  Vitamin D, Ergocalciferol, (DRISDOL) 1.25 MG (50000 UT) CAPS capsule Take 1 capsule (50,000 Units total) by mouth every 7 (seven) days. Continue until told otherwise. 01/24/19   Marthenia Rolling, DO    Allergies    Sulfonamide derivatives  Review of Systems   Review of Systems  Unable to perform ROS: Dementia    Physical Exam Updated Vital Signs There were no vitals taken for this visit.  Physical Exam Vitals and nursing note reviewed.  Constitutional:      General: She is not in acute distress.    Appearance: She is well-developed and well-nourished. She is not diaphoretic.  HENT:     Head: Normocephalic and atraumatic.  Eyes:     General: No scleral icterus.    Conjunctiva/sclera: Conjunctivae normal.  Cardiovascular:     Rate and Rhythm: Normal rate and regular rhythm.     Pulses:          Dorsalis pedis pulses are 2+ on the right side and 2+ on the left side.     Heart sounds: Normal heart sounds. No murmur heard. No friction rub. No gallop.   Pulmonary:     Effort: Pulmonary effort is normal. No respiratory distress.     Breath sounds: Normal breath sounds.  Abdominal:     General: Bowel sounds are normal. There is no distension.     Palpations: Abdomen is soft. There is no mass.     Tenderness: There is no abdominal tenderness. There is no guarding.  Musculoskeletal:     Cervical back: Normal range of motion.     Comments: No tenderness to palpation of the hip femur or knee.  Pain in the knee and mid thigh  with internal/external rotation of the hip.  Skin:    General: Skin is warm and dry.  Neurological:     Mental Status: She is alert and oriented to person, place, and time.  Psychiatric:        Behavior: Behavior normal.     ED Results / Procedures / Treatments   Labs (all labs ordered are listed, but only abnormal results are displayed) Labs Reviewed - No data to display  EKG None  Radiology No results found.  Procedures Procedures {  Medications Ordered in ED Medications - No data to display  ED Course  I have reviewed the triage vital signs and the nursing notes.  Pertinent labs & imaging results that were available during my care of the patient were reviewed by me and considered in my medical decision making (see chart for details).    MDM Rules/Calculators/A&P                          WU:JWJX, hip/leg pain VS:  Vitals:   03/25/20 1904 03/25/20 1905 03/25/20 1906  BP:  113/71   Pulse:  74   Resp:  15   Temp: 98.3 F (36.8 C) 98.3 F (36.8 C)   SpO2: 99% 98%   Weight:   56.5 kg  Height:   5\' 5"  (1.651 m)    is gathered by patient, EMS and daughter at bedside. Previous records obtained and reviewed. DDX:The patient's complaint of fall involves an extensive number of diagnostic and treatment options, and is a complaint that carries with it a high risk of complications, morbidity, and potential mortality. Given the large differential diagnosis, medical decision making is of high complexity. The emergent differential diagnosis for trauma is extensive and requires complex medical decision making. The differential includes, but is not limited to traumatic brain injury, Orbital trauma, maxillofacial trauma, skull fracture, blunt/penetrating neck trauma, vertebral artery dissection, whiplash, cervical fracture, neurogenic shock, spinal cord injury, thoracic trauma (blunt/penetrating) cardiac trauma, thoracic and lumbar spine trauma. Abdominal trauma (blunt.  Penetrating), genitourinary trauma, extremity fractures, skin lacerations/ abrasions, vascular injuries. Labs: I ordered reviewed and interpreted labs which include  Cbc- wnl Bmp- mild renal insufficiency, slightly elevated glucose Imaging: I ordered and reviewed images which included cxr, R femur and pelvic xray. I independently visualized and interpreted all imaging. Significant findings include R intertrochanteric fracture with questionable pubic ramus fracture.  EKG: EKG looks like NSR in the 60s however there is artifact Consults:Dr. BJ:YNWGNFA with ortho who will admit the patient, Family medicine for admission. Aundria Rud with Hip fracture. Will need admission. Patient disposition:The patient appears reasonably stabilized for admission considering the current resources, flow, and capabilities available in the ED at this time, and I doubt any other [facility] requiring further screening and/or treatment in the ED prior to admission.        Final Clinical Impression(s) / ED Diagnoses Final diagnoses:  None    Rx / DC Orders ED Discharge Orders    None       [facility], PA-C 03/25/20 2247    03/27/20, MD 03/26/20 [phone]

## 2020-03-25 NOTE — ED Notes (Signed)
Patient transported to X-ray 

## 2020-03-25 NOTE — ED Notes (Signed)
RN attempted report x2 

## 2020-03-25 NOTE — Progress Notes (Addendum)
Case discussed with EDP.  Plan for surgery in the morning 7:30 am.  NPO tonight at [facility].  Full consultation to come in the am.  I have discussed with her daughter via phone.

## 2020-03-25 NOTE — ED Triage Notes (Signed)
Pt BIB [facility] from the [facility]. Pt slipped and fell while using her walker. No LOC. Did not hit head. Pt not taking blood thinners. H/o dementia; pt at baseline on arrival. Pt c/o right thigh/knee pain.

## 2020-03-25 NOTE — H&P (Signed)
Family Medicine Teaching [facility] Admission History and Physical Service Pager: [phone]  Patient name: Joann [name] Medical record number: [id number] Date of birth: [birth year] Age: 87 y.o. Gender: female  Primary Care Provider: Lavonda Jumbo, DO Consultants: Ortho Code Status: DNR (confirmed by daughter, written in Living Will)  Chief Complaint: fracture after fall   Assessment and Plan: Joann [name] is a 87 y.o. female presenting with fracture of right intertrochanteric right femur. PMH is significant for pernicious anemia and pancytopenia 2/2 B12 deficiency, Alzheimer's dementia, tobacco use, and osteopenia.  Comminuted Fracture of Right Intertrochanteric Femur: Patient presented to ED complaining of right hip pain after witnessed fall at ALF and found to have comminuted fracture of the right intertrochanteric femur with mild apex lateral angulation and possible nondisplaced fracture of the right inferior pubic ramus. Ortho was consulted with plan for surgical correction tomorrow (2/27). No history of head trauma or LOC. CXR clear. Medication reviewed and none identified as a potential cause. Not currently on blood thinners. - admit to FPTS, attending Dr. McDiarmid - vitals per protocol - ortho consulted, appreciate recs - PT/OT following surgery - IV morphine 1mg  q2h PRN for severe pain - bed rest - Vitamin D, calcium - Imodium 2mg  PRN for loose stools/diarrhea  - recommend adjusting pain medications when taking PO - scheduled tylenol and PRN IV morphine  Alzheimer's Dementia: Diagnosed in 2012. Currently living in ALF.  - continue Depakote sprinkle 125mg  BID, Olanzapine 2.5mg  qAM - delirium precautions - monitor mentation   Lower extremity Swelling: Chronic, at baseline. Home meds: Torsemide 20mg  QD, last given AM 2/26. Endorses good urine output.  - hold torsemide 2/27 due to surgery and rise in Cr - monitor swelling and restart when indicated  Tobacco Use: 3-4  cigarettes per day. Daughter declines nicotine patch.  FEN/GI: NPO at [facility] Prophylaxis:   Disposition: med-surg  History of Present Illness:  Joann [name] is a 87 y.o. female presenting after witnessed fall at ALF. She notes that she was in the TV room and got up suddenly and was going to use her walker and the walker rolled ahead of her and she fell. This was a witnessed fall by nurse at ALF. She denies any head trauma or LOC.   Review Of Systems: Per HPI with the following additions:   Review of Systems  Constitutional: Negative for chills and fever.  Respiratory: Negative for cough and shortness of breath.   Cardiovascular: Negative for chest pain.  Gastrointestinal: Negative for abdominal pain, constipation, diarrhea, nausea and vomiting.  Genitourinary: Negative.   Musculoskeletal: Positive for falls and joint pain (right hip).  Neurological: Negative for loss of consciousness.    Patient Active Problem List   Diagnosis Date Noted  . Intertrochanteric fracture of right femur (HCC) 03/25/2020  . Malnutrition of moderate degree 01/20/2019  . Closed fracture of left inferior pubic ramus (HCC) 01/16/2019  . Anemia 09/01/2018  . Increased sleeping 08/26/2018  . Swollen ankles 11/09/2015  . Vitamin B12 deficiency 04/28/2015  . Benign Ethnic Leukopenia 04/28/2015  . Stroke, lacunar (HCC) 04/28/2015  . Mixed Alzheimer's and vascular dementia (HCC) 03/23/2015  . Memory loss of unknown cause 10/05/2014  . Vitamin D deficiency 06/13/2011  . KNEE PAIN, LEFT, CHRONIC 02/02/2009  . ANEMIA, PERNICIOUS 09/21/2007  . HYPERLIPIDEMIA 03/27/2006  . TOBACCO DEPENDENCE 03/27/2006  . NEUROPATHY, PERIPHERAL 03/27/2006  . OSTEOPENIA 03/27/2006    Past Medical History: Past Medical History:  Diagnosis Date  . ANEMIA, PERNICIOUS 09/21/2007   Qualifier: Diagnosis  of  By: Humberto SealsSaxon NP, Darl PikesSusan    . Benign Ethnic Leukopenia 04/28/2015  . Dizziness 07/10/2014  . Hip osteoarthritis 09/24/2010  . History  of anemia 10/05/2014  . Hypercholesteremia   . HYPERLIPIDEMIA 03/27/2006   Qualifier: Diagnosis of  By: Levada SchillingWATT, JOANNE    . KNEE PAIN, LEFT, CHRONIC 02/02/2009   Qualifier: Diagnosis of  By: Clotilde DieterStrother MD, Amber    . Leukopenia 03/23/2015  . Mixed Alzheimer's and vascular dementia (HCC) 03/23/2015  . MRSA (methicillin resistant staph aureus) culture positive 06/21/2011  . NEUROPATHY, PERIPHERAL 03/27/2006   Qualifier: Diagnosis of  By: Levada SchillingWATT, JOANNE    . OSTEOPENIA 03/27/2006  . Stroke, lacunar (HCC) 04/28/2015  . UTI (urinary tract infection) 03/23/2015  . Vitamin B12 deficiency 04/28/2015  . Vitamin D deficiency 06/13/2011    Past Surgical History: Past Surgical History:  Procedure Laterality Date  . ABDOMINAL HYSTERECTOMY    . INCISION AND DRAINAGE PERIRECTAL ABSCESS  06/16/2011   Procedure: IRRIGATION AND DEBRIDEMENT PERIRECTAL ABSCESS;  Surgeon: Robyne AskewPaul S Toth III, MD;  Location: [facility] OR;  Service: General;  Laterality: N/A;    Social History: Social History   Tobacco Use  . Smoking status: Current Every Day Smoker    Packs/day: 0.50    Types: Cigarettes    Last attempt to quit: 06/15/2011    Years since quitting: 8.7  . Smokeless tobacco: Never Used  Substance Use Topics  . Alcohol use: No  . Drug use: No   Additional social history:  Please also refer to relevant sections of EMR.  Family History: Family History  Problem Relation Age of Onset  . Kidney disease Mother   . Cancer Sister        colon  . Cancer Brother        lung  . Cancer Sister        stomach  . Dementia Sister   . Dementia Sister   . Dementia Sister   . Dementia Brother    Allergies and Medications: Allergies  Allergen Reactions  . Sulfonamide Derivatives Other (See Comments)    Exact reaction not recalled- patient remarked it made her "not feel well"   No current facility-administered medications on file prior to encounter.   Current Outpatient Medications on File Prior to Encounter  Medication Sig  Dispense Refill  . acetaminophen (TYLENOL) 500 MG tablet Take 500 mg by mouth every 6 (six) hours as needed (for a fever of 99.5-101 F and monitor until normal or for headaches & minor discomfort- CANNOT EXCEED 2,000 MG/24 HOURS).    . Cholecalciferol (VITAMIN D3) 50 MCG (2000 UT) capsule Take 2,000 Units by mouth daily.    . divalproex (DEPAKOTE SPRINKLE) 125 MG capsule Take 125 mg by mouth 2 (two) times daily.    Marland Kitchen. guaifenesin (ROBITUSSIN) 100 MG/5ML syrup Take 100 mg by mouth every 6 (six) hours as needed for cough (NOT TO EXCEED 4 DOSES/24 HOURS).    Marland Kitchen. loperamide (IMODIUM) 2 MG capsule Take 2 mg by mouth daily as needed for diarrhea or loose stools (with each loose stool or diarrhea- cannot exceed 8 doses/24 hours).    . magnesium hydroxide (MILK OF MAGNESIA) 400 MG/5ML suspension Take 30 mLs by mouth at bedtime as needed for mild constipation.    Marland Kitchen. Neomycin-Bacitracin-Polymyxin (TRIPLE ANTIBIOTIC) [phone] OINT Apply 1 application topically See admin instructions. Apply to affected area(s) after cleaning with normal saline, then cover with gauze and tape or Band-Aid(s)- until healed    . OLANZapine (ZYPREXA)  2.5 MG tablet Take 2.5 mg by mouth at bedtime.    . torsemide (DEMADEX) 20 MG tablet Take 20 mg by mouth daily.      Objective: BP (!) 105/59   Pulse 90   Temp 98.3 F (36.8 C)   Resp 18   Ht 5\' 5"  (1.651 m)   Wt 56.5 kg   SpO2 99%   BMI 20.73 kg/m  Exam: General: pleasant thin frail elderly woman, lying comfortably on ED bed, in no acute distress with non-toxic appearance HEENT: normocephalic, atraumatic, PERRL, EOMI CV: regular rate and rhythm without murmurs, rubs, or gallops, +1 LE edema on right, trace on left, <2 sec cap refill in LE bilaterally, 2+ radial and pedal pulses bilaterally Lungs: clear to auscultation bilaterally with normal work of breathing on room air, speaking in full sentences Skin: warm, dry, no bruising or hematoma on right hip Neuro: Alert and  oriented x x2 (not to time), speech normal  Labs and Imaging: CBC BMET  Recent Labs  Lab 03/25/20 2042  WBC 6.3  HGB 12.6  HCT 39.6  PLT 294   Recent Labs  Lab 03/25/20 2042  NA 136  K 4.4  CL 100  CO2 25  BUN 21  CREATININE 1.08*  GLUCOSE 128*  CALCIUM 9.2     PT/INR: 12.7/1.0 A positive blood type  DG Chest 1 View  Result Date: 03/25/2020 CLINICAL DATA:  Fall EXAM: CHEST  1 VIEW COMPARISON:  September 01, 2018 FINDINGS: The cardiomediastinal silhouette is unchanged in contour.Atherosclerotic calcifications of the aorta. No pleural effusion. No pneumothorax. No acute pleuroparenchymal abnormality. Visualized abdomen is unremarkable. Unchanged contours of the RIGHT ribs consistent with sequela of remote prior fractures. IMPRESSION: No acute cardiopulmonary abnormality. Electronically Signed   By: September 03, 2018 MD   On: 03/25/2020 19:53   DG Pelvis 1-2 Views  Result Date: 03/25/2020 CLINICAL DATA:  Fall EXAM: PELVIS - 1-2 VIEW; RIGHT FEMUR 2 VIEWS COMPARISON:  January 15, 2019 FINDINGS: Osteopenia. There is a comminuted fracture of the inter trochanteric RIGHT femur. Femoral head appears to be seated within the acetabulum. There is mild apex lateral angulation of the fracture. There is questionable lucency traversing the RIGHT inferior pubic ramus, limited by overlying stool. Vascular calcifications. Pelvic phleboliths. Degenerative changes of the lower lumbar spine. Degenerative changes of the pubic symphysis. IMPRESSION: 1. Comminuted fracture of the intertrochanteric RIGHT femur with mild apex lateral angulation. 2. Possible lucency traversing the RIGHT inferior pubic ramus which may reflect a nondisplaced fracture. Evaluation is limited secondary to overlying stool. Electronically Signed   By: January 17, 2019 MD   On: 03/25/2020 19:51   DG FEMUR, MIN 2 VIEWS RIGHT  Result Date: 03/25/2020 CLINICAL DATA:  Fall EXAM: PELVIS - 1-2 VIEW; RIGHT FEMUR 2 VIEWS COMPARISON:   January 15, 2019 FINDINGS: Osteopenia. There is a comminuted fracture of the inter trochanteric RIGHT femur. Femoral head appears to be seated within the acetabulum. There is mild apex lateral angulation of the fracture. There is questionable lucency traversing the RIGHT inferior pubic ramus, limited by overlying stool. Vascular calcifications. Pelvic phleboliths. Degenerative changes of the lower lumbar spine. Degenerative changes of the pubic symphysis. IMPRESSION: 1. Comminuted fracture of the intertrochanteric RIGHT femur with mild apex lateral angulation. 2. Possible lucency traversing the RIGHT inferior pubic ramus which may reflect a nondisplaced fracture. Evaluation is limited secondary to overlying stool. Electronically Signed   By: January 17, 2019 MD   On: 03/25/2020 19:51   Zanobia Griebel, 03/27/2020,  DO 03/25/2020, 10:25 PM PGY-3, [place] [facility] FPTS Intern pager: [phone], text pages welcome

## 2020-03-26 ENCOUNTER — Encounter (HOSPITAL_COMMUNITY)
Admission: EM | Disposition: A | Discharge status: Skilled Nursing Facility | Payer: Self-pay | Source: Skilled Nursing Facility | Attending: Family Medicine

## 2020-03-26 ENCOUNTER — Inpatient Hospital Stay (HOSPITAL_COMMUNITY): Payer: Medicare Other | Admitting: Certified Registered"

## 2020-03-26 ENCOUNTER — Encounter (HOSPITAL_COMMUNITY): Payer: Self-pay | Admitting: Family Medicine

## 2020-03-26 ENCOUNTER — Inpatient Hospital Stay (HOSPITAL_COMMUNITY): Payer: Medicare Other

## 2020-03-26 DIAGNOSIS — S72144D Nondisplaced intertrochanteric fracture of right femur, subsequent encounter for closed fracture with routine healing: Secondary | ICD-10-CM | POA: Diagnosis not present

## 2020-03-26 DIAGNOSIS — F0280 Dementia in other diseases classified elsewhere without behavioral disturbance: Secondary | ICD-10-CM

## 2020-03-26 DIAGNOSIS — W19XXXA Unspecified fall, initial encounter: Secondary | ICD-10-CM

## 2020-03-26 DIAGNOSIS — G309 Alzheimer's disease, unspecified: Secondary | ICD-10-CM

## 2020-03-26 DIAGNOSIS — M810 Age-related osteoporosis without current pathological fracture: Secondary | ICD-10-CM | POA: Diagnosis not present

## 2020-03-26 DIAGNOSIS — I872 Venous insufficiency (chronic) (peripheral): Secondary | ICD-10-CM

## 2020-03-26 DIAGNOSIS — F0150 Vascular dementia without behavioral disturbance: Secondary | ICD-10-CM

## 2020-03-26 DIAGNOSIS — Z8781 Personal history of (healed) traumatic fracture: Secondary | ICD-10-CM

## 2020-03-26 HISTORY — PX: INTRAMEDULLARY (IM) NAIL INTERTROCHANTERIC: SHX5875

## 2020-03-26 LAB — CBC
HCT: 36.5 % (ref 36.0–46.0)
Hemoglobin: 11.6 g/dL — ABNORMAL LOW (ref 12.0–15.0)
MCH: 27.8 pg (ref 26.0–34.0)
MCHC: 31.8 g/dL (ref 30.0–36.0)
MCV: 87.5 fL (ref 80.0–100.0)
Platelets: 301 10*3/uL (ref 150–400)
RBC: 4.17 MIL/uL (ref 3.87–5.11)
RDW: 12.5 % (ref 11.5–15.5)
WBC: 8.6 10*3/uL (ref 4.0–10.5)
nRBC: 0 % (ref 0.0–0.2)

## 2020-03-26 LAB — BASIC METABOLIC PANEL
Anion gap: 14 (ref 5–15)
BUN: 20 mg/dL (ref 8–23)
CO2: 24 mmol/L (ref 22–32)
Calcium: 9.1 mg/dL (ref 8.9–10.3)
Chloride: 100 mmol/L (ref 98–111)
Creatinine, Ser: 1 mg/dL (ref 0.44–1.00)
GFR, Estimated: 54 mL/min — ABNORMAL LOW (ref >60)
Glucose, Bld: 112 mg/dL — ABNORMAL HIGH (ref 70–99)
Potassium: 4.3 mmol/L (ref 3.5–5.1)
Sodium: 138 mmol/L (ref 135–145)

## 2020-03-26 LAB — SURGICAL PCR SCREEN
MRSA, PCR: NEGATIVE
Staphylococcus aureus: NEGATIVE

## 2020-03-26 LAB — SARS CORONAVIRUS 2 (TAT 6-24 HRS): SARS Coronavirus 2: NEGATIVE

## 2020-03-26 SURGERY — FIXATION, FRACTURE, INTERTROCHANTERIC, WITH INTRAMEDULLARY ROD
Anesthesia: General | Laterality: Right

## 2020-03-26 MED ORDER — LOPERAMIDE HCL 2 MG PO CAPS: 2.0000 mg | ORAL_CAPSULE | Freq: Every day | ORAL | Status: DC | PRN

## 2020-03-26 MED ORDER — LIDOCAINE HCL (CARDIAC) PF 100 MG/5ML IV SOSY
PREFILLED_SYRINGE | INTRAVENOUS | Status: DC | PRN
  Administered 2020-03-26: 40 mg via INTRAVENOUS

## 2020-03-26 MED ORDER — CEFAZOLIN SODIUM-DEXTROSE 1-4 GM/50ML-% IV SOLN
1.0000 g | Freq: Four times a day (QID) | INTRAVENOUS | Status: AC
  Administered 2020-03-26 – 2020-03-27 (×3): 1 g via INTRAVENOUS
  Filled 2020-03-26 (×3): qty 50

## 2020-03-26 MED ORDER — 0.9 % SODIUM CHLORIDE (POUR BTL) OPTIME
TOPICAL | Status: DC | PRN
  Administered 2020-03-26: 1000 mL

## 2020-03-26 MED ORDER — LACTATED RINGERS IV SOLN
INTRAVENOUS | Status: DC | PRN
Start: 2020-03-26 — End: 2020-03-26

## 2020-03-26 MED ORDER — ACETAMINOPHEN 10 MG/ML IV SOLN
INTRAVENOUS | Status: DC | PRN
  Administered 2020-03-26: 1000 mg via INTRAVENOUS

## 2020-03-26 MED ORDER — ONDANSETRON HCL 4 MG/2ML IJ SOLN: 4.0000 mg | Freq: Four times a day (QID) | INTRAMUSCULAR | Status: DC | PRN

## 2020-03-26 MED ORDER — ROCURONIUM 10MG/ML (10ML) SYRINGE FOR MEDFUSION PUMP - OPTIME
INTRAVENOUS | Status: DC | PRN
  Administered 2020-03-26: 50 mg via INTRAVENOUS

## 2020-03-26 MED ORDER — METOCLOPRAMIDE HCL 5 MG/ML IJ SOLN: 5.0000 mg | Freq: Three times a day (TID) | INTRAMUSCULAR | Status: DC | PRN

## 2020-03-26 MED ORDER — ONDANSETRON HCL 4 MG/2ML IJ SOLN
INTRAMUSCULAR | Status: DC | PRN
  Administered 2020-03-26: 4 mg via INTRAVENOUS

## 2020-03-26 MED ORDER — ACETAMINOPHEN 10 MG/ML IV SOLN
INTRAVENOUS | Status: AC
  Filled 2020-03-26: qty 100

## 2020-03-26 MED ORDER — OXYCODONE HCL 5 MG PO TABS: 2.5000 mg | ORAL_TABLET | Freq: Four times a day (QID) | ORAL | Status: DC | PRN

## 2020-03-26 MED ORDER — FENTANYL CITRATE (PF) 100 MCG/2ML IJ SOLN: 25.0000 ug | INTRAMUSCULAR | Status: DC | PRN

## 2020-03-26 MED ORDER — ASPIRIN EC 81 MG PO TBEC: 81.0000 mg | DELAYED_RELEASE_TABLET | Freq: Every day | ORAL | 0 refills | Status: DC

## 2020-03-26 MED ORDER — POVIDONE-IODINE 10 % EX SWAB: 2.0000 "application " | Freq: Once | CUTANEOUS | Status: DC

## 2020-03-26 MED ORDER — DEXAMETHASONE SODIUM PHOSPHATE 10 MG/ML IJ SOLN
INTRAMUSCULAR | Status: DC | PRN
  Administered 2020-03-26: 10 mg via INTRAVENOUS

## 2020-03-26 MED ORDER — DOCUSATE SODIUM 100 MG PO CAPS
100.0000 mg | ORAL_CAPSULE | Freq: Two times a day (BID) | ORAL | Status: DC
  Administered 2020-03-26 – 2020-03-29 (×6): 100 mg via ORAL
  Filled 2020-03-26 (×6): qty 1

## 2020-03-26 MED ORDER — VITAMIN D 25 MCG (1000 UNIT) PO TABS
2000.0000 [IU] | ORAL_TABLET | Freq: Every day | ORAL | Status: DC
  Administered 2020-03-26 – 2020-03-29 (×4): 2000 [IU] via ORAL
  Filled 2020-03-26 (×4): qty 2

## 2020-03-26 MED ORDER — ONDANSETRON HCL 4 MG PO TABS: 4.0000 mg | ORAL_TABLET | Freq: Four times a day (QID) | ORAL | Status: DC | PRN

## 2020-03-26 MED ORDER — PHENYLEPHRINE HCL (PRESSORS) 10 MG/ML IV SOLN
INTRAVENOUS | Status: DC | PRN
  Administered 2020-03-26: 120 ug via INTRAVENOUS
  Administered 2020-03-26 (×2): 80 ug via INTRAVENOUS

## 2020-03-26 MED ORDER — ASPIRIN 81 MG PO CHEW
81.0000 mg | CHEWABLE_TABLET | Freq: Every day | ORAL | Status: DC
  Administered 2020-03-26 – 2020-03-29 (×4): 81 mg via ORAL
  Filled 2020-03-26 (×4): qty 1

## 2020-03-26 MED ORDER — HYDROCODONE-ACETAMINOPHEN 5-325 MG PO TABS: 1.0000 | ORAL_TABLET | Freq: Four times a day (QID) | ORAL | 0 refills | Status: DC | PRN

## 2020-03-26 MED ORDER — SUFENTANIL CITRATE 50 MCG/ML IV SOLN
INTRAVENOUS | Status: AC
  Filled 2020-03-26: qty 1

## 2020-03-26 MED ORDER — SUFENTANIL CITRATE 50 MCG/ML IV SOLN
INTRAVENOUS | Status: DC | PRN
  Administered 2020-03-26: 5 ug via INTRAVENOUS
  Administered 2020-03-26: 10 ug via INTRAVENOUS

## 2020-03-26 MED ORDER — OLANZAPINE 2.5 MG PO TABS
2.5000 mg | ORAL_TABLET | Freq: Every day | ORAL | Status: DC
  Administered 2020-03-26 – 2020-03-28 (×3): 2.5 mg via ORAL
  Filled 2020-03-26 (×4): qty 1

## 2020-03-26 MED ORDER — PROPOFOL 10 MG/ML IV BOLUS
INTRAVENOUS | Status: DC | PRN
  Administered 2020-03-26: 60 mg via INTRAVENOUS

## 2020-03-26 MED ORDER — METOCLOPRAMIDE HCL 5 MG PO TABS: 5.0000 mg | ORAL_TABLET | Freq: Three times a day (TID) | ORAL | Status: DC | PRN

## 2020-03-26 MED ORDER — CHLORHEXIDINE GLUCONATE 4 % EX LIQD: 60.0000 mL | Freq: Once | CUTANEOUS | Status: DC

## 2020-03-26 MED ORDER — CEFAZOLIN SODIUM-DEXTROSE 2-4 GM/100ML-% IV SOLN
2.0000 g | INTRAVENOUS | Status: AC
  Administered 2020-03-26: 2 g via INTRAVENOUS
  Filled 2020-03-26: qty 100

## 2020-03-26 MED ORDER — SUGAMMADEX SODIUM 200 MG/2ML IV SOLN
INTRAVENOUS | Status: DC | PRN
  Administered 2020-03-26: 200 mg via INTRAVENOUS

## 2020-03-26 MED ORDER — DIVALPROEX SODIUM 125 MG PO CSDR
125.0000 mg | DELAYED_RELEASE_CAPSULE | Freq: Two times a day (BID) | ORAL | Status: DC
  Administered 2020-03-26 – 2020-03-29 (×7): 125 mg via ORAL
  Filled 2020-03-26 (×8): qty 1

## 2020-03-26 MED ORDER — ACETAMINOPHEN 500 MG PO TABS
500.0000 mg | ORAL_TABLET | Freq: Four times a day (QID) | ORAL | Status: DC
  Administered 2020-03-26 – 2020-03-29 (×12): 500 mg via ORAL
  Filled 2020-03-26 (×14): qty 1

## 2020-03-26 SURGICAL SUPPLY — 41 items
ALCOHOL 70% 16 OZ (MISCELLANEOUS) IMPLANT
BIT DRILL CALIBRATED 4.2 (BIT) ×1 IMPLANT
BNDG COHESIVE 6X5 TAN STRL LF (GAUZE/BANDAGES/DRESSINGS) ×4 IMPLANT
CANISTER SUCT 3000ML PPV (MISCELLANEOUS) ×2 IMPLANT
COVER PERINEAL POST (MISCELLANEOUS) ×2 IMPLANT
COVER SURGICAL LIGHT HANDLE (MISCELLANEOUS) ×2 IMPLANT
COVER WAND RF STERILE (DRAPES) ×2 IMPLANT
DRAPE C-ARM 42X72 X-RAY (DRAPES) ×2 IMPLANT
DRAPE HALF SHEET 40X57 (DRAPES) IMPLANT
DRAPE INCISE IOBAN 66X45 STRL (DRAPES) ×2 IMPLANT
DRAPE STERI IOBAN 125X83 (DRAPES) ×2 IMPLANT
DRILL BIT CALIBRATED 4.2 (BIT) ×2
DRSG ADAPTIC 3X8 NADH LF (GAUZE/BANDAGES/DRESSINGS) ×2 IMPLANT
DURAPREP 26ML APPLICATOR (WOUND CARE) ×2 IMPLANT
ELECT CAUTERY BLADE 6.4 (BLADE) ×2 IMPLANT
ELECT REM PT RETURN 9FT ADLT (ELECTROSURGICAL) ×2
ELECTRODE REM PT RTRN 9FT ADLT (ELECTROSURGICAL) ×1 IMPLANT
GAUZE SPONGE 4X4 12PLY STRL (GAUZE/BANDAGES/DRESSINGS) ×2 IMPLANT
GAUZE SPONGE 4X4 12PLY STRL LF (GAUZE/BANDAGES/DRESSINGS) ×2 IMPLANT
GLOVE BIO SURGEON STRL SZ7.5 (GLOVE) ×4 IMPLANT
GLOVE SRG 8 PF TXTR STRL LF DI (GLOVE) ×2 IMPLANT
GLOVE SURG UNDER POLY LF SZ8 (GLOVE) ×4
GOWN STRL REUS W/ TWL LRG LVL3 (GOWN DISPOSABLE) ×1 IMPLANT
GOWN STRL REUS W/ TWL XL LVL3 (GOWN DISPOSABLE) ×2 IMPLANT
GOWN STRL REUS W/TWL LRG LVL3 (GOWN DISPOSABLE) ×2
GOWN STRL REUS W/TWL XL LVL3 (GOWN DISPOSABLE) ×4
GUIDEWIRE 3.2X400 (WIRE) ×2 IMPLANT
KIT BASIN OR (CUSTOM PROCEDURE TRAY) ×2 IMPLANT
KIT TURNOVER KIT B (KITS) ×2 IMPLANT
NAIL TROCH FIX 10X170 130 (Nail) ×2 IMPLANT
NS IRRIG 1000ML POUR BTL (IV SOLUTION) ×2 IMPLANT
PACK GENERAL/GYN (CUSTOM PROCEDURE TRAY) ×2 IMPLANT
PAD ARMBOARD 7.5X6 YLW CONV (MISCELLANEOUS) ×4 IMPLANT
SCREW FENES TFNA 100 ST (Screw) ×2 IMPLANT
SCREW LOCK T25 FT 36X5X4.3X (Screw) ×1 IMPLANT
SCREW LOCKING 5.0X36MM (Screw) ×2 IMPLANT
STAPLER VISISTAT 35W (STAPLE) ×2 IMPLANT
SUT MON AB 2-0 CT1 36 (SUTURE) ×2 IMPLANT
TOWEL GREEN STERILE (TOWEL DISPOSABLE) ×2 IMPLANT
TOWEL GREEN STERILE FF (TOWEL DISPOSABLE) ×2 IMPLANT
WATER STERILE IRR 1000ML POUR (IV SOLUTION) IMPLANT

## 2020-03-26 NOTE — TOC CAGE-AID Note (Signed)
Transition of Care [facility]) - CAGE-AID Screening   Patient Details  Name: Joann [name] MRN: [id number] Date of Birth: 1928/01/28  Clinical Narrative:  Patient denies any current alcohol or drug use.  CAGE-AID Screening:    Have You Ever Felt You Ought to Cut Down on Your Drinking or Drug Use?: No Have People Annoyed You By Critizing Your Drinking Or Drug Use?: No Have You Felt Bad Or Guilty About Your Drinking Or Drug Use?: No Have You Ever Had a Drink or Used Drugs First Thing In The Morning to Steady Your Nerves or to Get Rid of a Hangover?: No CAGE-AID Score: 0  Substance Abuse Education Offered: No

## 2020-03-26 NOTE — Op Note (Signed)
Date of Surgery: 03/26/2020  INDICATIONS: Ms. Hair is a 87 y.o.-year-old female who sustained a right hip fracture. The risks and benefits of the procedure discussed with the patient and caregiver prior to the procedure and all questions were answered; consent was obtained.  PREOPERATIVE DIAGNOSIS: right hip fracture   POSTOPERATIVE DIAGNOSIS: Same   PROCEDURE: Treatment of intertrochanteric, pertrochanteric, subtrochanteric fracture with intramedullary implant. CPT [phone]   SURGEON: Kathi Der. Aundria Rud, M.D.   ANESTHESIA: general   IV FLUIDS AND URINE: See anesthesia record   ESTIMATED BLOOD LOSS: 75 cc  IMPLANTS:  Synthes TFNA 10 x 170 mm 95 mm proximal compression screw 5.0 x 36 mm distal interlock  DRAINS: None.   COMPLICATIONS: None.   DESCRIPTION OF PROCEDURE: The patient was brought to the operating room and placed supine on the operating table. The patient's leg had been signed prior to the procedure. The patient had the anesthesia placed by the anesthesiologist. The prep verification and incision time-outs were performed to confirm that this was the correct patient, site, side and location. The patient had an SCD on the opposite lower extremity. The patient did receive antibiotics prior to the incision and was re-dosed during the procedure as needed at indicated intervals. The patient was positioned on the fracture table with the table in traction and internal rotation to reduce the hip. The well leg was placed in a scissor position and all bony prominences were well-padded. The patient had the lower extremity prepped and draped in the standard surgical fashion. The incision was made 4 finger breadths superior to the greater trochanter. A guide pin was inserted into the tip of the greater trochanter under fluoroscopic guidance. An opening reamer was used to gain access to the femoral canal. The nail length was measured and inserted down the femoral canal to its proper depth. The  appropriate version of insertion for the lag screw was found under fluoroscopy. A pin was inserted up the femoral neck through the jig. The length of the lag screw was then measured. The lag screw was inserted as near to center-center in the head as possible. The leg was taken out of traction, then the compression screw was used to compress across the fracture. Compression was visualized on serial xrays.   We next turned our attention to the distal interlocking screw.  This was placed through the drill guide of the nail inserter.  A small incision was made overlying the lateral thigh at the screw site, and a tonsil was used to disect down to bone.  A drill pass was made through the jig and across the nail through both cortices.  This was measured, and the appropriate screw was placed under hand power and found to have good bite.    The wound was copiously irrigated with saline and the subcutaneous layer closed with 2.0 vicryl and the skin was reapproximated with staples. The wounds were cleaned and dried a final time and a sterile dressing was placed. The hip was taken through a range of motion at the end of the case under fluoroscopic imaging to visualize the approach-withdraw phenomenon and confirm implant length in the head. The patient was then awakened from anesthesia and taken to the recovery room in stable condition. All counts were correct at the end of the case.   POSTOPERATIVE PLAN: The patient will be weight bearing as tolerated and will return in 2 weeks for staple removal and the patient will receive DVT prophylaxis based on other medications,  activity level, and risk ratio of bleeding to thrombosis.     Joann Rued, MD [facility] [place] [phone] 9:00 AM

## 2020-03-26 NOTE — Progress Notes (Signed)
FPTS Interim Progress Note  S: Patient resting comfortably this AM. Denies any concerns or complaints. Denies any pain.   O: BP 101/63 (BP Location: Left Arm)   Pulse 100   Temp 99.6 F (37.6 C) (Oral)   Resp 16   Ht 5\' 5"  (1.651 m)   Wt 56.5 kg   SpO2 99%   BMI 20.73 kg/m   General: pleasant thin elderly woman, resting comfortably in bed, in no acute distress with non-toxic appearance HEENT: normocephalic, atraumatic CV: regular rate and rhythm without murmurs, rubs, or gallops, 1+ LE edema on right, trace to no LE edema on left Lungs: clear to auscultation bilaterally with normal work of breathing on room air, speaking in full sentences Skin: warm, dry, no apparent bruising/hematoma present on right hip Extremities: warm and well perfused  A/P: - NO changes in plan. See H&P for further details. Plan for surgery by ortho today.  [place], DO 03/26/2020, 6:30 AM PGY-3, [facility] Medicine Service pager [phone]

## 2020-03-26 NOTE — Brief Op Note (Signed)
03/26/2020  8:58 AM  PATIENT:  Joann [name]  87 y.o. female  PRE-OPERATIVE DIAGNOSIS:  RIGHT HIP FRACTURE  POST-OPERATIVE DIAGNOSIS:  RIGHT HIP FRACTURE  PROCEDURE:  Procedure(s): INTRAMEDULLARY (IM) NAIL INTERTROCHANTRIC (Right)  SURGEON:  Surgeon(s) and Role:    * Ranay Ketter, Noah Delaine, MD - Primary  PHYSICIAN ASSISTANT: none  ASSISTANTS: none   ANESTHESIA:   general  EBL:  75 cc  BLOOD ADMINISTERED:none  DRAINS: none   LOCAL MEDICATIONS USED:  NONE  SPECIMEN:  No Specimen  DISPOSITION OF SPECIMEN:  N/A  COUNTS:  YES  TOURNIQUET:  * No tourniquets in log *  DICTATION: .Note written in EPIC  PLAN OF CARE: Admit to inpatient   PATIENT DISPOSITION:  PACU - hemodynamically stable.   Delay start of Pharmacological VTE agent (>24hrs) due to surgical blood loss or risk of bleeding: not applicable

## 2020-03-26 NOTE — Progress Notes (Signed)
PT Cancellation Note  Patient Details Name: Joann [name] MRN: [id number] DOB: 12-Jan-1928   Cancelled Treatment:    Reason Eval/Treat Not Completed: Other (comment)   For surgery for intertrochanteric fx today;   Will follow up postop;   Van Clines, [place]  [facility] Pager [phone] Office [phone]    Levi Aland 03/26/2020, 7:47 AM

## 2020-03-26 NOTE — Transfer of Care (Signed)
Immediate Anesthesia Transfer of Care Note  Patient: Joann [name]  Procedure(s) Performed: INTRAMEDULLARY (IM) NAIL INTERTROCHANTRIC (Right )  Patient Location: PACU  Anesthesia Type:General  Level of Consciousness: drowsy, patient cooperative and responds to stimulation  Airway & Oxygen Therapy: Patient Spontanous Breathing and Patient connected to nasal cannula oxygen  Post-op Assessment: Report given to RN, Post -op Vital signs reviewed and stable and Patient moving all extremities X 4  Post vital signs: Reviewed and stable  Last Vitals:  Vitals Value Taken Time  BP 107/51 03/26/20 0907  Temp 36.1 C 03/26/20 0905  Pulse 93 03/26/20 0908  Resp 20 03/26/20 0908  SpO2 98 % 03/26/20 0908  Vitals shown include unvalidated device data.  Last Pain:  Vitals:   03/26/20 0905  TempSrc:   PainSc: 0-No pain         Complications: No complications documented.

## 2020-03-26 NOTE — Anesthesia Procedure Notes (Signed)
Procedure Name: Intubation Date/Time: 03/26/2020 7:57 AM Performed by: Melina Schools, CRNA Pre-anesthesia Checklist: Patient identified, Emergency Drugs available, Suction available, Patient being monitored and Timeout performed Patient Re-evaluated:Patient Re-evaluated prior to induction Oxygen Delivery Method: Circle system utilized Preoxygenation: Pre-oxygenation with 100% oxygen Induction Type: IV induction and Cricoid Pressure applied Ventilation: Mask ventilation without difficulty Grade View: Grade II Tube type: Oral Tube size: 7.5 mm Number of attempts: 1 Airway Equipment and Method: Stylet Placement Confirmation: ETT inserted through vocal cords under direct vision,  positive ETCO2 and breath sounds checked- equal and bilateral Secured at: 23 cm Tube secured with: Tape Dental Injury: Teeth and Oropharynx as per pre-operative assessment

## 2020-03-26 NOTE — Anesthesia Preprocedure Evaluation (Addendum)
Anesthesia Evaluation  Patient identified by MRN, date of birth, ID band Patient confused    Reviewed: Allergy & Precautions, NPO status , Patient's Chart, lab work & pertinent test results, Unable to perform ROS - Chart review only  Airway Mallampati: II  TM Distance: >3 FB Neck ROM: Full    Dental  (+) Edentulous Upper, Dental Advisory Given, Loose,    Pulmonary Current Smoker,    breath sounds clear to auscultation       Cardiovascular negative cardio ROS   Rhythm:Regular Rate:Normal     Neuro/Psych PSYCHIATRIC DISORDERS Dementia    GI/Hepatic negative GI ROS, Neg liver ROS,   Endo/Other  negative endocrine ROS  Renal/GU negative Renal ROS     Musculoskeletal  (+) Arthritis ,   Abdominal Normal abdominal exam  (+)   Peds  Hematology negative hematology ROS (+)   Anesthesia Other Findings   Reproductive/Obstetrics                           Anesthesia Physical Anesthesia Plan  ASA: III  Anesthesia Plan: General   Post-op Pain Management:    Induction: Intravenous  PONV Risk Score and Plan: 3 and Ondansetron, Dexamethasone and Treatment may vary due to age or medical condition  Airway Management Planned: Oral ETT  Additional Equipment: None  Intra-op Plan:   Post-operative Plan: Extubation in OR  Informed Consent: I have reviewed the patients History and Physical, chart, labs and discussed the procedure including the risks, benefits and alternatives for the proposed anesthesia with the patient or authorized representative who has indicated his/her understanding and acceptance.     Consent reviewed with POA  Plan Discussed with: CRNA  Anesthesia Plan Comments:        Anesthesia Quick Evaluation

## 2020-03-26 NOTE — Consult Note (Signed)
ORTHOPAEDIC CONSULTATION  REQUESTING PHYSICIAN: McDiarmid, Leighton Roach, MD  PCP:  Lavonda Jumbo, DO  Chief Complaint: Fall  HPI: Joann [name] is a 87 y.o. female who complains of right hip pain following a fall prior to arrival yesterday evening.  She sustained a fall at her assisted living facility.  She does have Alzheimer's dementia at baseline as well as pancytopenia.  She was noted on work-up in the ER to have an intertrochanteric fracture of the right hip.  Orthopedic surgery consulted for treatment recommendations.  She does not remember the exact details of the event but this was a witnessed fall.  No history of previous surgery.  Denies diabetes.  History obtained via discussion with consultants, discussion with her daughter who is her healthcare power of attorney and review of the chart.  Past Medical History:  Diagnosis Date  . ANEMIA, PERNICIOUS 09/21/2007   Qualifier: Diagnosis of  By: Humberto Seals NP, Darl Pikes    . Benign Ethnic Leukopenia 04/28/2015  . Dizziness 07/10/2014  . Hip osteoarthritis 09/24/2010  . History of anemia 10/05/2014  . Hypercholesteremia   . HYPERLIPIDEMIA 03/27/2006   Qualifier: Diagnosis of  By: Levada Schilling    . KNEE PAIN, LEFT, CHRONIC 02/02/2009   Qualifier: Diagnosis of  By: Clotilde Dieter MD, Amber    . Leukopenia 03/23/2015  . Mixed Alzheimer's and vascular dementia (HCC) 03/23/2015  . MRSA (methicillin resistant staph aureus) culture positive 06/21/2011  . NEUROPATHY, PERIPHERAL 03/27/2006   Qualifier: Diagnosis of  By: Levada Schilling    . OSTEOPENIA 03/27/2006  . Stroke, lacunar (HCC) 04/28/2015  . UTI (urinary tract infection) 03/23/2015  . Vitamin B12 deficiency 04/28/2015  . Vitamin D deficiency 06/13/2011   Past Surgical History:  Procedure Laterality Date  . ABDOMINAL HYSTERECTOMY    . INCISION AND DRAINAGE PERIRECTAL ABSCESS  06/16/2011   Procedure: IRRIGATION AND DEBRIDEMENT PERIRECTAL ABSCESS;  Surgeon: Robyne Askew, MD;  Location: [facility] OR;  Service:  General;  Laterality: N/A;   Social History   Socioeconomic History  . Marital status: Legally Separated    Spouse name: Not on file  . Number of children: 2  . Years of education: 9  . Highest education level: Not on file  Occupational History  . Not on file  Tobacco Use  . Smoking status: Current Every Day Smoker    Packs/day: 0.50    Types: Cigarettes    Last attempt to quit: 06/15/2011    Years since quitting: 8.7  . Smokeless tobacco: Never Used  Substance and Sexual Activity  . Alcohol use: No  . Drug use: No  . Sexual activity: Not on file  Other Topics Concern  . Not on file  Social History Narrative   Patient is one of 11 children   Pt has Daughter, Satsuki Zillmer and and son, Anamae Rochelle, who is disabled and Wheelchair bound.       Advanced Directives: Code Status: Thomasene Ripple, daughter of patient, is Curator for [employer]      Contact: First and Last Name   Jaquay,[name] Daughter [phone]   1st point of contact      [facility] Son [phone]      Current Social History   (Please include date ( . td) when updating information )      Who lives at home: patient lives alone 02/09/2016    Transportation: daughter Mitzi Davenport transports to appointments 02/09/2016   Important Relationships: Daughter and son see above 02/09/2016  Current Stressors: Patient wants to remain in her home and does not want to go to a facility 02/09/2016   Work / Education:  Social Security check of $755 02/09/2016   Other: Patient is able to cook her own breakfast, ambulates on her own and is [place]  02/09/2016                                                                                                      Social Determinants of Health   Financial Resource Strain: Not on file  Food Insecurity: Not on file  Transportation Needs: Not on file  Physical Activity: Not on file  Stress: Not on file  Social Connections: Not on file   Family History   Problem Relation Age of Onset  . Kidney disease Mother   . Cancer Sister        colon  . Cancer Brother        lung  . Cancer Sister        stomach  . Dementia Sister   . Dementia Sister   . Dementia Sister   . Dementia Brother    Allergies  Allergen Reactions  . Sulfonamide Derivatives Other (See Comments)    Exact reaction not recalled- patient remarked it made her "not feel well"   Prior to Admission medications   Medication Sig Start Date End Date Taking? Authorizing Provider  acetaminophen (TYLENOL) 500 MG tablet Take 500 mg by mouth every 6 (six) hours as needed (for a fever of 99.5-101 F and monitor until normal or for headaches & minor discomfort- CANNOT EXCEED 2,000 MG/24 HOURS).   Yes [provider]  Cholecalciferol (VITAMIN D3) 50 MCG (2000 UT) capsule Take 2,000 Units by mouth daily.   Yes [provider]  divalproex (DEPAKOTE SPRINKLE) 125 MG capsule Take 125 mg by mouth 2 (two) times daily.   Yes [provider]  guaifenesin (ROBITUSSIN) 100 MG/5ML syrup Take 100 mg by mouth every 6 (six) hours as needed for cough (NOT TO EXCEED 4 DOSES/24 HOURS).   Yes [provider]  loperamide (IMODIUM) 2 MG capsule Take 2 mg by mouth daily as needed for diarrhea or loose stools (with each loose stool or diarrhea- cannot exceed 8 doses/24 hours).   Yes [provider]  magnesium hydroxide (MILK OF MAGNESIA) 400 MG/5ML suspension Take 30 mLs by mouth at bedtime as needed for mild constipation.   Yes [provider]  Neomycin-Bacitracin-Polymyxin (TRIPLE ANTIBIOTIC) [phone] OINT Apply 1 application topically See admin instructions. Apply to affected area(s) after cleaning with normal saline, then cover with gauze and tape or Band-Aid(s)- until healed   Yes [provider]  OLANZapine (ZYPREXA) 2.5 MG tablet Take 2.5 mg by mouth at bedtime.   Yes [provider]  torsemide (DEMADEX) 20 MG tablet Take 20 mg by  mouth daily.   Yes [provider]   DG Chest 1 View  Result Date: 03/25/2020 CLINICAL DATA:  Fall EXAM: CHEST  1 VIEW COMPARISON:  September 01, 2018 FINDINGS: The cardiomediastinal silhouette is unchanged in contour.Atherosclerotic  calcifications of the aorta. No pleural effusion. No pneumothorax. No acute pleuroparenchymal abnormality. Visualized abdomen is unremarkable. Unchanged contours of the RIGHT ribs consistent with sequela of remote prior fractures. IMPRESSION: No acute cardiopulmonary abnormality. Electronically Signed   By: Meda Klinefelter MD   On: 03/25/2020 19:53   DG Pelvis 1-2 Views  Result Date: 03/25/2020 CLINICAL DATA:  Fall EXAM: PELVIS - 1-2 VIEW; RIGHT FEMUR 2 VIEWS COMPARISON:  January 15, 2019 FINDINGS: Osteopenia. There is a comminuted fracture of the inter trochanteric RIGHT femur. Femoral head appears to be seated within the acetabulum. There is mild apex lateral angulation of the fracture. There is questionable lucency traversing the RIGHT inferior pubic ramus, limited by overlying stool. Vascular calcifications. Pelvic phleboliths. Degenerative changes of the lower lumbar spine. Degenerative changes of the pubic symphysis. IMPRESSION: 1. Comminuted fracture of the intertrochanteric RIGHT femur with mild apex lateral angulation. 2. Possible lucency traversing the RIGHT inferior pubic ramus which may reflect a nondisplaced fracture. Evaluation is limited secondary to overlying stool. Electronically Signed   By: Meda Klinefelter MD   On: 03/25/2020 19:51   DG FEMUR, MIN 2 VIEWS RIGHT  Result Date: 03/25/2020 CLINICAL DATA:  Fall EXAM: PELVIS - 1-2 VIEW; RIGHT FEMUR 2 VIEWS COMPARISON:  January 15, 2019 FINDINGS: Osteopenia. There is a comminuted fracture of the inter trochanteric RIGHT femur. Femoral head appears to be seated within the acetabulum. There is mild apex lateral angulation of the fracture. There is questionable lucency traversing the RIGHT inferior  pubic ramus, limited by overlying stool. Vascular calcifications. Pelvic phleboliths. Degenerative changes of the lower lumbar spine. Degenerative changes of the pubic symphysis. IMPRESSION: 1. Comminuted fracture of the intertrochanteric RIGHT femur with mild apex lateral angulation. 2. Possible lucency traversing the RIGHT inferior pubic ramus which may reflect a nondisplaced fracture. Evaluation is limited secondary to overlying stool. Electronically Signed   By: Meda Klinefelter MD   On: 03/25/2020 19:51    Positive ROS: All other systems have been reviewed and were otherwise negative with the exception of those mentioned in the HPI and as above.  Physical Exam: General: Alert, no acute distress Cardiovascular: No pedal edema Respiratory: No cyanosis, no use of accessory musculature GI: No organomegaly, abdomen is soft and non-tender Skin: No lesions in the area of chief complaint Neurologic: Sensation intact distally Psychiatric: Patient is oriented to person but not to time or place. Lymphatic: No axillary or cervical lymphadenopathy  MUSCULOSKELETAL:  Right lower extremity is externally rotated and slightly shortened.  Otherwise neurovascularly intact.  No open wounds noted.  Assessment: Right hip closed intertrochanteric fracture  Plan: -Plan to proceed today with an intramedullary nail for the unstable right hip fracture.  This will allow for early mobilization and reduce morbidity.  -Treatment plan and recommendation for surgery discussed with her daughter, Mitzi Davenport, via the phone.  -Discussed the risk and benefits of the procedure in detail.  Informed consent provided by the healthcare power of attorney.  -She will be weightbearing as tolerated postoperatively and we will return her to the family medicine service for their excellent perioperative care.  Appreciate their assistance.    Yolonda Kida, MD Cell [phone]    03/26/2020 7:35 AM

## 2020-03-26 NOTE — Anesthesia Postprocedure Evaluation (Signed)
Anesthesia Post Note  Patient: Fish farm manager  Procedure(s) Performed: INTRAMEDULLARY (IM) NAIL INTERTROCHANTRIC (Right )     Patient location during evaluation: PACU Anesthesia Type: General Level of consciousness: awake and alert Pain management: pain level controlled Vital Signs Assessment: post-procedure vital signs reviewed and stable Respiratory status: spontaneous breathing, nonlabored ventilation, respiratory function stable and patient connected to nasal cannula oxygen Cardiovascular status: blood pressure returned to baseline and stable Postop Assessment: no apparent nausea or vomiting Anesthetic complications: no   No complications documented.  Last Vitals:  Vitals:   03/26/20 1100 03/26/20 1200  BP: (!) 96/54 91/60  Pulse: 91 78  Resp: 16 16  Temp: 36.4 C (!) 36.4 C  SpO2: 98% 98%    Last Pain:  Vitals:   03/26/20 1200  TempSrc: Oral  PainSc:                  Shelton Silvas

## 2020-03-26 NOTE — Progress Notes (Signed)
Occupational Therapy Evaluation Patient Details Name: Joann [name] MRN: [id number] DOB: 09/10/1927 Today's Date: 03/26/2020    History of Present Illness Joann [name] is a 87 y.o. female who complains of right hip pain following a fall prior to arrival yesterday evening.  She sustained a fall at her assisted living facility.  She does have Alzheimer's dementia at baseline as well as pancytopenia.  She was noted on work-up in the ER to have an intertrochanteric fracture of the right hip.  Orthopedic surgery consulted for treatment recommendations.  She does not remember the exact details of the event but this was a witnessed fall.  No history of previous surgery.  Denies diabetes. Pt is now s/p R hip IM nailing (WBAT RLE).   Clinical Impression   PTA, pt was living in a skilled nursing facility, receiving 24/7 care as needed by facility staff. Per daughter, pt was able to self-feed and groom with Mod I but required significant amount of assistance with other ADLs. Pt was Mod I with functional mobility using RW. Today, pt received semi-reclined in bed, daughter and son present, pt agreeable to OT eval. Pt presents with cognitive deficits at baseline (dementia) but able to answer basic questions and follow 1-step commands consistently. Pt required min-mod assist for bed mobility, heavy max assist x1 for sit>stand and stand pivot transfer to [facility] using RW, total assist for LB self-care, and mod assist-setup for UB self-care. Pt typically wears briefs at SNF per daughter. Pt requested to use BSC at beginning of session but accidentally voided 2x while standing/pivoting. Extended time taken to clean up bed linens/pt, notified nursing. Educated pt on WBAT RLE precautions, activity pacing, RW management, safety awareness, and d/c planning with family. Will continue to follow pt while in-house. Pt would benefit from continued skilled acute care OT services to maximize independence with ADLs/ADL mobility.     Follow Up  Recommendations  SNF;Supervision/Assistance - 24 hour (return to previous SNF)    Equipment Recommendations  Other (comment) (defer to next venue of care)    Recommendations for Other Services PT consult     Precautions / Restrictions Precautions Precautions: Fall Precaution Comments: WBAT RLE Restrictions Other Position/Activity Restrictions: WBAT RLE      Mobility Bed Mobility Overal bed mobility: Needs Assistance Bed Mobility: Rolling;Sidelying to Sit;Sit to Supine Rolling: Min assist Sidelying to sit: Mod assist   Sit to supine: Max assist   General bed mobility comments: initiates bed mobility but requires assistance at trunk and RLE for comfort    Transfers Overall transfer level: Needs assistance Equipment used: Rolling walker (2 wheeled) Transfers: Sit to/from [employer] Sit to Stand: Max assist;+2 physical assistance;+2 safety/equipment (would benefit from +2 next time) Stand pivot transfers: Max assist;+2 physical assistance;+2 safety/equipment (would benefit from +2 next time)       General transfer comment: heavy assist x1 for sit>stand from EOB and stand pivot transfer to/from [facility] using RW; cues for technique and safety    Balance Overall balance assessment: Needs assistance Sitting-balance support: Feet supported;No upper extremity supported Sitting balance-Leahy Scale: Good     Standing balance support: Bilateral upper extremity supported;During functional activity Standing balance-Leahy Scale: Poor        ADL either performed or assessed with clinical judgement   ADL Overall ADL's : Needs assistance/impaired Eating/Feeding: Modified independent;Bed level;Sitting Eating/Feeding Details (indicate cue type and reason): able to self feed lunch Grooming: Wash/dry face;Set up;Sitting Grooming Details (indicate cue type and reason): at EOB Upper Body  Bathing: Minimal assistance;Moderate assistance;Sitting Upper Body Bathing Details  (indicate cue type and reason): simulated at EOB Lower Body Bathing: Maximal assistance;Total assistance;Sitting/lateral leans;Sit to/from stand Lower Body Bathing Details (indicate cue type and reason): simulated sitting EOB Upper Body Dressing : Minimal assistance;Sitting Upper Body Dressing Details (indicate cue type and reason): for gown management sitting EOB Lower Body Dressing: Maximal assistance;Total assistance;Sitting/lateral leans;Sit to/from stand Lower Body Dressing Details (indicate cue type and reason): for sock management sitting EOB Toilet Transfer: Maximal assistance;+2 for physical assistance;+2 for safety/equipment;Cueing for safety;Cueing for sequencing;Stand-pivot;BSC;RW Toilet Transfer Details (indicate cue type and reason): would benefit from +2 for functional mobility/transfers using RW 2/2 weakness and fatigue Toileting- Clothing Manipulation and Hygiene: Total assistance;Sit to/from stand Toileting - Clothing Manipulation Details (indicate cue type and reason): total assist for perineal and perianal care in standing/sitting, pt voided at EOB and while standing before making it to [facility], family reports that pt typically wears briefs at [facility] Tub/ Shower Transfer:  (not assessed)   Functional mobility during ADLs: Maximal assistance;+2 for physical assistance;+2 for safety/equipment;Cueing for safety;Cueing for sequencing;Rolling walker General ADL Comments: heavy max assist +1 for sit>stand and stand pivot transfer to [facility] using RW, would benefit from 2+ assist in follow up     Vision Baseline Vision/History: No visual deficits Patient Visual Report: No change from baseline Vision Assessment?: No apparent visual deficits     Perception Perception Perception Tested?: No   Praxis Praxis Praxis tested?: Not tested      Hand Dominance Right   Extremity/Trunk Assessment     Lower Extremity Assessment LLE Deficits / Details: decreased strength/ROM in LLE 2/2 recent  IM nailing causing pain LLE: Unable to fully assess due to pain LLE Sensation: WNL LLE Coordination: WNL   Cervical / Trunk Assessment Cervical / Trunk Assessment: Normal   Communication Communication Communication: No difficulties   Cognition Arousal/Alertness: Awake/alert Behavior During Therapy: WFL for tasks assessed/performed Overall Cognitive Status: History of cognitive impairments - at baseline     General Comments: dementia at baseline per family              Home Living Family/patient expects to be discharged to:: Skilled nursing facility     Additional Comments: return to previous SNF with 24/7 S/A      Prior Functioning/Environment Level of Independence: Needs assistance  Gait / Transfers Assistance Needed: Mod I using RW in SNF ADL's / Homemaking Assistance Needed: able to perform grooming/eating with Mod I but required increased assistance with other ADLs at SNF   Comments: dementia at baseline        OT Problem List: Decreased strength;Decreased range of motion;Decreased activity tolerance;Impaired balance (sitting and/or standing);Decreased cognition;Decreased safety awareness;Decreased knowledge of use of DME or AE;Pain;Increased edema      OT Treatment/Interventions: Self-care/ADL training;Therapeutic exercise;Neuromuscular education;DME and/or AE instruction;Therapeutic activities;Cognitive remediation/compensation;Patient/family education    OT Goals(Current goals can be found in the care plan section) Acute Rehab OT Goals Patient Stated Goal: to return to SNF OT Goal Formulation: With patient/family Time For Goal Achievement: 04/09/20 Potential to Achieve Goals: Fair  OT Frequency: Min 2X/week    AM-PAC OT "6 Clicks" Daily Activity     Outcome Measure Help from another person eating meals?: None Help from another person taking care of personal grooming?: A Little Help from another person toileting, which includes using toliet, bedpan, or  urinal?: Total Help from another person bathing (including washing, rinsing, drying)?: A Lot Help from another person to put on and  taking off regular upper body clothing?: A Little Help from another person to put on and taking off regular lower body clothing?: Total 6 Click Score: 14   End of Session Equipment Utilized During Treatment: Gait belt;Rolling walker Nurse Communication: Mobility status;Weight bearing status  Activity Tolerance: Patient tolerated treatment well;Patient limited by fatigue Patient left: in bed;with call bell/phone within reach;with bed alarm set;with nursing/sitter in room;with family/visitor present  OT Visit Diagnosis: Unsteadiness on feet (R26.81);History of falling (Z91.81);Pain;Muscle weakness (generalized) (M62.81) Pain - Right/Left: Right Pain - part of body: Leg                Time: 1353-1455 OT Time Calculation (min): 62 min Charges:  OT General Charges $OT Visit: 1 Visit OT Evaluation $OT Eval Moderate Complexity: 1 Mod OT Treatments $Self Care/Home Management : 23-37 mins  Norris Cross, OTR/L [facility] [phone]   Mechele Claude 03/26/2020, 6:39 PM

## 2020-03-26 NOTE — Progress Notes (Signed)
Called Dr. Aundria Rud, with [facility], to discuss appropriate VTE prophylaxis.  He preferred aspirin 81 mg daily.  He noted it would be appropriate to start today. -Aspirin ordered

## 2020-03-26 NOTE — Progress Notes (Signed)
Just spoke with pt's daughter Rickey Farrier). She has confirmed that pt is a DNR. I have let her know that we will need a copy of the form in order to change her mother's code status. She stated that she is currently getting dressed and will be heading here to bring the form. Verbal surgical consent and blood consent were also completed via phone call.

## 2020-03-27 DIAGNOSIS — S72144D Nondisplaced intertrochanteric fracture of right femur, subsequent encounter for closed fracture with routine healing: Secondary | ICD-10-CM | POA: Diagnosis not present

## 2020-03-27 DIAGNOSIS — Z66 Do not resuscitate: Secondary | ICD-10-CM

## 2020-03-27 DIAGNOSIS — Z8781 Personal history of (healed) traumatic fracture: Secondary | ICD-10-CM | POA: Diagnosis not present

## 2020-03-27 DIAGNOSIS — G309 Alzheimer's disease, unspecified: Secondary | ICD-10-CM | POA: Diagnosis not present

## 2020-03-27 DIAGNOSIS — Z515 Encounter for palliative care: Secondary | ICD-10-CM

## 2020-03-27 DIAGNOSIS — Z7189 Other specified counseling: Secondary | ICD-10-CM

## 2020-03-27 DIAGNOSIS — M810 Age-related osteoporosis without current pathological fracture: Secondary | ICD-10-CM | POA: Diagnosis not present

## 2020-03-27 LAB — VITAMIN D 25 HYDROXY (VIT D DEFICIENCY, FRACTURES): Vit D, 25-Hydroxy: 42.3 ng/mL (ref 30–100)

## 2020-03-27 MED ORDER — ENSURE ENLIVE PO LIQD
237.0000 mL | Freq: Two times a day (BID) | ORAL | Status: DC
  Administered 2020-03-29 (×2): 237 mL via ORAL

## 2020-03-27 MED ORDER — ADULT MULTIVITAMIN W/MINERALS CH
1.0000 | ORAL_TABLET | Freq: Every day | ORAL | Status: DC
  Administered 2020-03-27 – 2020-03-29 (×3): 1 via ORAL
  Filled 2020-03-27 (×3): qty 1

## 2020-03-27 NOTE — Evaluation (Signed)
Physical Therapy Evaluation Patient Details Name: Joann [name] MRN: [id number] DOB: Mar 30, 1927 Today's Date: 03/27/2020   History of Present Illness  Joann [name] is a 87 y.o. female who complains of right hip pain following a fall prior to arrival evening of 2/26.  She sustained a fall at her assisted living facility.  She does have Alzheimer's dementia at baseline as well as pancytopenia.  She was noted on work-up in the ER to have an intertrochanteric fracture of the right hip.  Orthopedic surgery consulted for treatment recommendations.  She does not remember the exact details of the event but this was a witnessed fall.  No history of previous surgery.  Denies diabetes. Pt is now s/p R hip IM nailing (WBAT RLE).  Clinical Impression   Patient is s/p above surgery resulting in functional limitations due to the deficits listed below (see PT Problem List). Comes from a facility where pt was able to walk with an assistive device, and had 24 hour supervision; Presents to PT with decr functional mobility, requiring 2 person assist for transfers, which does represent a functional decline;  Patient will benefit from skilled PT to increase their independence and safety with mobility to allow discharge to the venue listed below.       Follow Up Recommendations SNF    Equipment Recommendations  Rolling walker with 5" wheels;3in1 (PT)    Recommendations for Other Services       Precautions / Restrictions Precautions Precautions: Fall Precaution Comments: WBAT RLE Restrictions Weight Bearing Restrictions: No RLE Weight Bearing: Weight bearing as tolerated      Mobility  Bed Mobility Overal bed mobility: Needs Assistance Bed Mobility: Supine to Sit     Supine to sit: Mod assist;+2 for safety/equipment     General bed mobility comments: initiates bed mobility but requires assistance at trunk and RLE for comfort    Transfers Overall transfer level: Needs assistance Equipment used: Rolling  walker (2 wheeled);2 person hand held assist Transfers: Sit to/from [employer] Transfers Sit to Stand: Max assist;+2 physical assistance;+2 safety/equipment Stand pivot transfers: +2 physical assistance;Max assist       General transfer comment: Initially attempted sit to stand to RW, howver pt with heavy antalgic L lean; Opted to move the RW and assist pt bed to chair via stand pivot transfer with 2 person assist  Ambulation/Gait                Stairs            Wheelchair Mobility    Modified Rankin (Stroke Patients Only)       Balance     Sitting balance-Leahy Scale: Good       Standing balance-Leahy Scale: Poor                               Pertinent Vitals/Pain Pain Assessment: Faces Faces Pain Scale: Hurts a little bit Pain Location: RHip with movement Pain Descriptors / Indicators: Grimacing Pain Intervention(s): Monitored during session    Home Living Family/patient expects to be discharged to:: Skilled nursing facility                 Additional Comments: return to previous SNF with 24/7 S/A    Prior Function Level of Independence: Needs assistance   Gait / Transfers Assistance Needed: Mod I using RW in SNF  ADL's / Homemaking Assistance Needed: able to perform grooming/eating with Mod I but required  increased assistance with other ADLs at SNF  Comments: dementia at baseline     Hand Dominance   Dominant Hand: Right    Extremity/Trunk Assessment   Upper Extremity Assessment Upper Extremity Assessment: Defer to OT evaluation    Lower Extremity Assessment Lower Extremity Assessment: RLE deficits/detail RLE Deficits / Details: Decr AROM and strength, limited by pain postop LLE Sensation: WNL LLE Coordination: WNL       Communication   Communication: No difficulties  Cognition Arousal/Alertness: Awake/alert Behavior During Therapy: WFL for tasks assessed/performed Overall Cognitive Status: History of  cognitive impairments - at baseline                                 General Comments: dementia at baseline per family      General Comments      Exercises     Assessment/Plan    PT Assessment Patient needs continued PT services  PT Problem List Decreased strength;Decreased range of motion;Decreased activity tolerance;Decreased balance;Decreased mobility;Decreased coordination;Decreased cognition;Decreased knowledge of use of DME;Decreased safety awareness;Decreased knowledge of precautions       PT Treatment Interventions DME instruction;Gait training;Functional mobility training;Therapeutic activities;Therapeutic exercise;Balance training;Neuromuscular re-education;Cognitive remediation;Patient/family education    PT Goals (Current goals can be found in the Care Plan section)  Acute Rehab PT Goals Patient Stated Goal: to return to SNF PT Goal Formulation: Patient unable to participate in goal setting Time For Goal Achievement: 04/10/20 Potential to Achieve Goals: Fair    Frequency Min 2X/week   Barriers to discharge        Co-evaluation               AM-PAC PT "6 Clicks" Mobility  Outcome Measure Help needed turning from your back to your side while in a flat bed without using bedrails?: A Lot Help needed moving from lying on your back to sitting on the side of a flat bed without using bedrails?: A Lot Help needed moving to and from a bed to a chair (including a wheelchair)?: A Lot Help needed standing up from a chair using your arms (e.g., wheelchair or bedside chair)?: A Lot Help needed to walk in hospital room?: Total Help needed climbing 3-5 steps with a railing? : Total 6 Click Score: 10    End of Session Equipment Utilized During Treatment: Gait belt Activity Tolerance: Patient tolerated treatment well Patient left: in chair;with call bell/phone within reach;with chair alarm set Nurse Communication: Mobility status PT Visit Diagnosis:  Unsteadiness on feet (R26.81);Other abnormalities of gait and mobility (R26.89)    Time: 1761-6073 PT Time Calculation (min) (ACUTE ONLY): 16 min   Charges:   PT Evaluation $PT Eval Moderate Complexity: 1 Mod          Van Clines, [place]  Acute Rehabilitation Services Pager [phone] Office [phone]   Levi Aland 03/27/2020, 6:31 PM

## 2020-03-27 NOTE — Progress Notes (Signed)
Joann [name], the daughter of all of Joann [name] and her healthcare power of attorney, was called to clarify CODE STATUS. We discussed and defined the role of a surrogate decision maker and then discussed what Joann [name] would want if her heart were to stop beating. From our discussion, Joann [name] noted that her mother would want to be a DNR and would not want to be intubated. Joann [name] was interested in further goals of care discussion and appropriate forms for future medical decision making. She was informed that we will have palliative care come to help [place] with these forms and decisions.  Joann Mo, MD

## 2020-03-27 NOTE — Progress Notes (Signed)
   Subjective:  Patient reports that she is not having pain.   She denies numbness or tingling. She denies fever or chills. Reports she does not remember that she had surgery - patient does have alzheimer's.    Date of Surgery: 03/26/2020  INDICATIONS: Joann [name] is a 87 y.o.-year-old female who sustained a right hip fracture. The risks and benefits of the procedure discussed with the patient and caregiver prior to the procedure and all questions were answered; consent was obtained.  PREOPERATIVE DIAGNOSIS: right hip fracture   POSTOPERATIVE DIAGNOSIS: Same   PROCEDURE: Treatment of intertrochanteric, pertrochanteric, subtrochanteric fracture with intramedullary implant. CPT [phone]   SURGEON: Kathi Der. Aundria Rud, M.D.  Objective:   VITALS:   Vitals:   03/27/20 0600 03/27/20 0843 03/27/20 1230 03/27/20 1605  BP: 106/61 (!) 102/56 (!) 90/51 103/62  Pulse: 83 72 75 75  Resp: 18 14 16 17   Temp: 98.3 F (36.8 C) 97.8 F (36.6 C) 98 F (36.7 C) 98.8 F (37.1 C)  TempSrc: Oral Oral Oral Oral  SpO2: 98% 99% 99% 96%  Weight:      Height:       Right Lower Extremity:  Neurologically intact Neurovascular intact Sensation intact distally Intact pulses distally Dorsiflexion/Plantar flexion intact Incision: dressing C/D/I  Calf soft.  Mild pain with knee flexion.  Cap refill less than 2 seconds.   Lab Results  Component Value Date   WBC 8.6 03/26/2020   HGB 11.6 (L) 03/26/2020   HCT 36.5 03/26/2020   MCV 87.5 03/26/2020   PLT 301 03/26/2020   BMET    Component Value Date/Time   NA 138 03/26/2020 0115   NA 139 09/08/2018 1748   K 4.3 03/26/2020 0115   CL 100 03/26/2020 0115   CO2 24 03/26/2020 0115   GLUCOSE 112 (H) 03/26/2020 0115   BUN 20 03/26/2020 0115   BUN 18 09/08/2018 1748   CREATININE 1.00 03/26/2020 0115   CREATININE 0.66 04/27/2015 1600   CALCIUM 9.1 03/26/2020 0115   GFRNONAA 54 (L) 03/26/2020 0115   GFRNONAA 81 04/27/2015 1600   GFRAA >60 01/17/2019  0442   GFRAA >89 04/27/2015 1600     Assessment/Plan: 1 Day Post-Op   Principal Problem:   Intertrochanteric fracture of right femur (HCC) Active Problems:   Osteoporosis   Mixed Alzheimer's and vascular dementia (HCC)   Venous insufficiency of both lower extremities with edema   Fall   S/P right hip fracture   Advance diet Up with therapy D/C per primary.   Weightbearing Status: WBAT RLE DVT Prophylaxis: ASA 81mg    04/29/2015 03/27/2020, 6:05 PM  Joann Cookey PA-C  Physician Assistant with Dr. 03/29/2020 [place]

## 2020-03-27 NOTE — Progress Notes (Signed)
Family Medicine Teaching Service Daily Progress Note Intern Pager: [phone]  Patient name: Joann [name] Medical record number: [id number] Date of birth: [birth year] Age: 87 y.o. Gender: female  Primary Care Provider: Lavonda Jumbo, DO Consultants: Ortho Code Status: DNR  Pt Overview and Major Events to Date:  2/26 Admitted   Assessment and Plan: Joann [name] is a 87 y.o. female presenting with fracture of right intertrochanteric right femur. PMH is significant for pernicious anemia and pancytopenia 2/2 B12 deficiency, Alzheimer's dementia, tobacco use, and osteopenia.  Combined Fracture of Right Intertrochanteric Femur  Patient presented to ED complaining of right hip pain after witnessed fall at ALF and found to have comminuted fracture of the right intertrochanteric femur with mild apex lateral angulation and possible nondisplaced fracture of the right inferior pubic ramus. Ortho performed  intramedullary nail for the right hip fracture yesterday (2/27).  - Ortho following, appreciate continued care and recommendations - pain medications: Tylenol q 6h, Oxy 2.5mg  q 6h prn (has not used)  - PT/OT - Per Ortho, weight bearing as tolerated.  F/u with Ortho in 2 weeks for staple removal  Alzheimer's Dementia  Diagnosed in 2012. Currently living in ALF.  - continue Depakote sprinkle 125mg  BID, Olanzapine 2.5mg  qAM - delirium precautions - monitor mentation   Lower extremity swelling Chronic, at baseline. Home meds: Torsemide 20mg  QD, last given AM 2/26. Endorses good urine output.  - hold torsemide 2/27 due to surgery and rise in Cr - monitor swelling and restart when indicated  Tobacco Use 3-4 cigarettes per day. Daughter declines nicotine patch   Goals of Care Discussed code status with daughter who is healthcare power of attorny and confirmed that patient is DNR.  - palliative consult placed   FEN/GI: Heart healthy  PPx: Aspirin 81mg  daily   Disposition:Pending Ortho. ALF w/  HH vs SNF.  Subjective:  No acute events overnight. Patient A&Ox1, only able to state her name. Asked if she is able to leave to smoke a cigarette. Advised that she can not. Denies any complaints this morning.   Objective: Temp:  [97 F (36.1 C)-99 F (37.2 C)] 98.3 F (36.8 C) (02/28 0600) Pulse Rate:  [75-94] 83 (02/28 0600) Resp:  [14-19] 18 (02/28 0600) BP: (91-126)/(49-63) 106/61 (02/28 0600) SpO2:  [95 %-100 %] 98 % (02/28 0600) Physical Exam: General: thin elderly woman resting comfortably, NAD Cardiovascular: RRR no murmurs Respiratory: CTAB. Normal WOB Abdomen: soft, non distended, non tender Skin: warm, dry. No apparent bruising of R hip  Extremities: Warm, well perfused.   Laboratory: Recent Labs  Lab 03/25/20 2042 03/26/20 0115  WBC 6.3 8.6  HGB 12.6 11.6*  HCT 39.6 36.5  PLT 294 301   Recent Labs  Lab 03/25/20 2042 03/26/20 0115  NA 136 138  K 4.4 4.3  CL 100 100  CO2 25 24  BUN 21 20  CREATININE 1.08* 1.00  CALCIUM 9.2 9.1  GLUCOSE 128* 112*     Imaging/Diagnostic Tests:  DG C-Arm 1-60 Min  Result Date: 03/26/2020 CLINICAL DATA:  IM nailing of right femur fracture. EXAM: OPERATIVE RIGHT HIP (WITH PELVIS IF PERFORMED) 4 VIEWS TECHNIQUE: Fluoroscopic spot image(s) were submitted for interpretation post-operatively. FLUOROSCOPY TIME:  42 seconds COMPARISON:  Right hip radiographs-03/25/2020 FINDINGS: Provided images demonstrate the sequela of intramedullary rod fixation of the proximal aspect of the right femur and dynamic screw fixation of the right femoral neck is transfixing known intertrochanteric femur fracture. Alignment appears anatomic. The distal aspect of the femoral rod is  transfixed with a single cancellous screw. There is a minimal amount of expected subcutaneous emphysema about the operative site. No radiopaque foreign body. IMPRESSION: Post intramedullary rod fixation of intertrochanteric femur fracture without evidence of complication.  Electronically Signed   By: Simonne Come M.D.   On: 03/26/2020 09:07   DG HIP OPERATIVE UNILAT WITH PELVIS RIGHT  Result Date: 03/26/2020 CLINICAL DATA:  IM nailing of right femur fracture. EXAM: OPERATIVE RIGHT HIP (WITH PELVIS IF PERFORMED) 4 VIEWS TECHNIQUE: Fluoroscopic spot image(s) were submitted for interpretation post-operatively. FLUOROSCOPY TIME:  42 seconds COMPARISON:  Right hip radiographs-03/25/2020 FINDINGS: Provided images demonstrate the sequela of intramedullary rod fixation of the proximal aspect of the right femur and dynamic screw fixation of the right femoral neck is transfixing known intertrochanteric femur fracture. Alignment appears anatomic. The distal aspect of the femoral rod is transfixed with a single cancellous screw. There is a minimal amount of expected subcutaneous emphysema about the operative site. No radiopaque foreign body. IMPRESSION: Post intramedullary rod fixation of intertrochanteric femur fracture without evidence of complication. Electronically Signed   By: Simonne Come M.D.   On: 03/26/2020 09:07    Joann Collum, DO 03/27/2020, 7:23 AM PGY-1, [facility] Family Medicine FPTS Intern pager: [phone], text pages welcome

## 2020-03-27 NOTE — Consult Note (Signed)
Consultation Note Date: 03/27/2020   Patient Name: Joann [name]  DOB: 1927-02-03  MRN: [id number]  Age / Sex: 87 y.o., female   PCP: Lavonda Jumbo, DO Referring Physician: McDiarmid, Leighton Roach, MD   REASON FOR CONSULTATION:Establishing goals of care  Palliative Care consult requested for goals of care discussion in this 87 y.o. female with multiple medical problems including anemia, pancytopenia, Mixed Alzheimer's and vascular dementia, tobacco use, osteopenia, and hyperlipidemia. She presented to ED from [facility] s/p witness fall with possible hip fracture. X-ray confirmed right hip intertrochanteric fracture. Orthopedic surgery consulted and patient underwent Right hip IM nailing on 03/26/2020.   Clinical Assessment and Goals of Care: I have reviewed medical records including lab results, imaging, Epic notes, and MAR. I spoke with patient's daughter/HCPOA Thomasene Ripple)  to discuss diagnosis prognosis, GOC, EOL wishes, disposition and options.  I introduced Palliative Medicine as specialized medical care for people living with serious illness. It focuses on providing relief from the symptoms and stress of a serious illness. The goal is to improve quality of life for both the patient and the family. Daughter verbalized understanding and appreciation of support.   We discussed a brief life review of the patient, along with her functional and nutritional status. Patient is a retired Environmental health practitioner. She has 2 children.   Prior to placement at ALF in 2020 patient lived alone with support of her daughter. Although she was diagnosed with dementia in 2016 she began showing further signs of decline and inability to care for herself in 2020. At facility patient was ambulatory with walker. Able to feed self and assist with some ADLs. Appetite is fair.    We discussed Her current illness and what it means in the larger context of Her on-going co-morbidities.  Natural disease  trajectory and expectations at EOL were discussed.  Daughter verbalized understanding of current and co-morbidities.   I attempted to elicit values and goals of care important to the patient.    Mitzi Davenport is clear in expressed goals to continue to treat the treatable. She remains hopeful her mother will continue to show some improvement/stability. Understands in the setting of recent surgery patient most likely will require SNF rehab before she can safely return to ALF.   Advanced directives, concepts specific to code status, artifical feeding and hydration, and rehospitalization were considered and discussed. Patient does have a documented advanced directive. Document reviewed at length. Daughter confirms DNR/DNI.   Detailed education provided on MOST forms. [name] verbalized understanding and plans to review documentation for follow up tomorrow. Daughter has been provided with a copy of the for further review.   Hospice and Palliative Care services outpatient were explained and offered. Patient and family verbalized their understanding and awareness of both palliative and hospice's goals and philosophy of care. In the setting of continued medical interventions and care outpatient Palliative support recommended. Daughter verbalized agreement.   Questions and concerns were addressed. The family was encouraged to call with questions or concerns.  PMT will continue to support holistically.   SOCIAL HISTORY:     reports that she has been smoking cigarettes. She has been smoking about 0.50 packs per day. She has never used smokeless tobacco. She reports that she does not drink alcohol and does not use drugs.  CODE STATUS: DNR  ADVANCE DIRECTIVES: Primary Decision Maker: Thomasene Ripple (daughter/HCPOA).     SYMPTOM MANAGEMENT: per attending   Palliative Prophylaxis:   Aspiration, Bowel Regimen, Delirium Protocol,  Eye Care, Frequent Pain Assessment and Oral Care  PSYCHO-SOCIAL/SPIRITUAL:  Support  System: Family Desire for further Chaplaincy support:No   Additional Recommendations (Limitations, Scope, Preferences):  No Artificial Feeding and DNR/DNI  Education on hospice/palliative    PAST MEDICAL HISTORY: Past Medical History:  Diagnosis Date  . ANEMIA, PERNICIOUS 09/21/2007   Qualifier: Diagnosis of  By: Humberto Seals NP, Darl Pikes    . Benign Ethnic Leukopenia 04/28/2015  . Closed fracture of left inferior pubic ramus (HCC) 01/16/2019  . Dizziness 07/10/2014  . Hip osteoarthritis 09/24/2010  . History of anemia 10/05/2014  . Hypercholesteremia   . HYPERLIPIDEMIA 03/27/2006   Qualifier: Diagnosis of  By: Levada Schilling    . Increased sleeping 08/26/2018  . KNEE PAIN, LEFT, CHRONIC 02/02/2009   Qualifier: Diagnosis of  By: Clotilde Dieter MD, Amber    . Leukopenia 03/23/2015  . Mixed Alzheimer's and vascular dementia (HCC) 03/23/2015  . MRSA (methicillin resistant staph aureus) culture positive 06/21/2011  . NEUROPATHY, PERIPHERAL 03/27/2006   Qualifier: Diagnosis of  By: Levada Schilling    . OSTEOPENIA 03/27/2006  . Osteoporosis 03/27/2006  . Stroke, lacunar (HCC) 04/28/2015  . UTI (urinary tract infection) 03/23/2015  . Venous insufficiency of both lower extremities with edema 11/09/2015  . Vitamin B12 deficiency 04/28/2015  . Vitamin D deficiency 06/13/2011    ALLERGIES:  is allergic to sulfonamide derivatives.   MEDICATIONS:  Current Facility-Administered Medications  Medication Dose Route Frequency Provider Last Rate Last Admin  . acetaminophen (TYLENOL) tablet 500 mg  500 mg Oral Q6H McDiarmid, Leighton Roach, MD   500 mg at 03/27/20 0909  . aspirin chewable tablet 81 mg  81 mg Oral Daily Mirian Mo, MD   81 mg at 03/27/20 5397  . calcium carbonate (OS-CAL - dosed in mg of elemental calcium) tablet 500 mg of elemental calcium  1 tablet Oral BID WC Yolonda Kida, MD   500 mg of elemental calcium at 03/27/20 0908  . cholecalciferol (VITAMIN D3) tablet 2,000 Units  2,000 Units Oral Daily Yolonda Kida, MD   2,000 Units at 03/27/20 [phone]  . divalproex (DEPAKOTE SPRINKLE) capsule 125 mg  125 mg Oral BID Yolonda Kida, MD   125 mg at 03/27/20 0908  . docusate sodium (COLACE) capsule 100 mg  100 mg Oral BID Yolonda Kida, MD   100 mg at 03/27/20 1937  . loperamide (IMODIUM) capsule 2 mg  2 mg Oral Daily PRN Yolonda Kida, MD      . OLANZapine [facility]) tablet 2.5 mg  2.5 mg Oral Daily Yolonda Kida, MD   2.5 mg at 03/27/20 9024  . ondansetron (ZOFRAN) tablet 4 mg  4 mg Oral Q6H PRN Yolonda Kida, MD       Or  . ondansetron [facility]) injection 4 mg  4 mg Intravenous Q6H PRN Yolonda Kida, MD      . oxyCODONE (Oxy IR/ROXICODONE) immediate release tablet 2.5 mg  2.5 mg Oral Q6H PRN McDiarmid, Leighton Roach, MD      . polyethylene glycol (MIRALAX / GLYCOLAX) packet 17 g  17 g Oral Daily PRN Yolonda Kida, MD        VITAL SIGNS: BP (!) 102/56 (BP Location: Right Arm)   Pulse 72   Temp 97.8 F (36.6 C) (Oral)   Resp 14   Ht 5\' 5"  (1.651 m)   Wt 56.5 kg   SpO2 99%   BMI 20.73 kg/m  [facility] Weights   03/25/20 1906  Weight: 56.5 kg    Estimated body mass index is 20.73 kg/m as calculated from the following:   Height as of this encounter: 5\' 5"  (1.651 m).   Weight as of this encounter: 56.5 kg.  LABS: CBC:    Component Value Date/Time   WBC 8.6 03/26/2020 0115   HGB 11.6 (L) 03/26/2020 0115   HGB 9.5 (L) 09/08/2018 1748   HCT 36.5 03/26/2020 0115   HCT 27.1 (L) 09/08/2018 1748   PLT 301 03/26/2020 0115   PLT 173 09/08/2018 1748   Comprehensive Metabolic Panel:    Component Value Date/Time   NA 138 03/26/2020 0115   NA 139 09/08/2018 1748   K 4.3 03/26/2020 0115   BUN 20 03/26/2020 0115   BUN 18 09/08/2018 1748   CREATININE 1.00 03/26/2020 0115   CREATININE 0.66 04/27/2015 1600   ALBUMIN 2.8 (L) 01/17/2019 0442   ALBUMIN 4.5 08/26/2018 1511     Review of Systems  Unable to perform ROS: Dementia   Prognosis:  Guarded  Discharge Planning:  Skilled Nursing Facility for rehab with Palliative care service follow-up  Recommendations: . DNR/DNI-as confirmed by daughter . Continue with current plan of care, treat the treatable . Daughter remains hopeful for continued improvement/stability.  . Detailed education and discussion regarding MOST form completion. Daughter provided with MOST form for review with plans to complete tomorrow.  . Outpatient Palliative support at discharge.  08/28/2018 PMT will continue to support and follow. Please call team line with urgent needs.   Palliative Performance Scale: PPS 30%              Daughter expressed understanding and was in agreement with this plan.   Thank you for allowing the Palliative Medicine Team to assist in the care of this patient.  Time In: 1300 Time Out: 1350 Time Total: 50 min.   Visit consisted of counseling and education dealing with the complex and emotionally intense issues of symptom management and palliative care in the setting of serious and potentially life-threatening illness.Greater than 50%  of this time was spent counseling and coordinating care related to the above assessment and plan.  Signed by:  Marland Kitchen, AGPCNP-BC Palliative Medicine Team  Phone: [phone] Pager: [phone] Amion: [phone]

## 2020-03-27 NOTE — NC FL2 (Signed)
[place] MEDICAID FL2 LEVEL OF CARE SCREENING TOOL     IDENTIFICATION  Patient Name: Joann [name] Birthdate: [birth year] Sex: female Admission Date (Current Location): 03/25/2020  [facility] and [place] Number:  Producer, television/film/video and Address:  [facility], [address], [place], [place] [address]      Provider Number: [id number]  Attending Physician Name and Address:  McDiarmid, Leighton Roach, MD  Relative Name and Phone Number:  Adin Lariccia, [phone]    Current Level of Care: Hospital Recommended Level of Care: Skilled Nursing Facility Prior Approval Number:    Date Approved/Denied:   PASRR Number: [id number]  Discharge Plan: SNF    Current Diagnoses: Patient Active Problem List   Diagnosis Date Noted  . Fall   . S/P right hip fracture   . Intertrochanteric fracture of right femur (HCC) 03/25/2020  . Malnutrition of moderate degree 01/20/2019  . Anemia 09/01/2018  . Venous insufficiency of both lower extremities with edema 11/09/2015  . Vitamin B12 deficiency 04/28/2015  . Benign Ethnic Leukopenia 04/28/2015  . Stroke, lacunar (HCC) 04/28/2015  . Mixed Alzheimer's and vascular dementia (HCC) 03/23/2015  . Vitamin D deficiency 06/13/2011  . ANEMIA, PERNICIOUS 09/21/2007  . HYPERLIPIDEMIA 03/27/2006  . TOBACCO DEPENDENCE 03/27/2006  . NEUROPATHY, PERIPHERAL 03/27/2006  . Osteoporosis 03/27/2006    Orientation RESPIRATION BLADDER Height & Weight     Self  Normal Incontinent,External catheter Weight: 124 lb 9 oz (56.5 kg) Height:  5\' 5"  (165.1 cm)  BEHAVIORAL SYMPTOMS/MOOD NEUROLOGICAL BOWEL NUTRITION STATUS      Incontinent Diet (See DC summary)  AMBULATORY STATUS COMMUNICATION OF NEEDS Skin   Extensive Assist Verbally Surgical wounds (R thigh w/ adhesive bandage.)                       Personal Care Assistance Level of Assistance  Bathing,Feeding,Dressing Bathing Assistance: Maximum assistance Feeding assistance: Limited  assistance Dressing Assistance: Maximum assistance     Functional Limitations Info  Sight,Hearing,Speech Sight Info: Impaired Hearing Info: Impaired Speech Info: Adequate    SPECIAL CARE FACTORS FREQUENCY  PT (By licensed PT),OT (By licensed OT)     PT Frequency: 5x week OT Frequency: 5x week            Contractures Contractures Info: Not present    Additional Factors Info  Code Status,Allergies,Psychotropic Code Status Info: DNR Allergies Info: Sulfonamide Derivatives Psychotropic Info: Zyprexa and Depakote         Current Medications (03/27/2020):  This is the current hospital active medication list Current Facility-Administered Medications  Medication Dose Route Frequency Provider Last Rate Last Admin  . acetaminophen (TYLENOL) tablet 500 mg  500 mg Oral Q6H McDiarmid, 03/29/2020, MD   500 mg at 03/27/20 1356  . aspirin chewable tablet 81 mg  81 mg Oral Daily 03/29/20, MD   81 mg at 03/27/20 03/29/20  . calcium carbonate (OS-CAL - dosed in mg of elemental calcium) tablet 500 mg of elemental calcium  1 tablet Oral BID WC 2800, MD   500 mg of elemental calcium at 03/27/20 0908  . cholecalciferol (VITAMIN D3) tablet 2,000 Units  2,000 Units Oral Daily 03/29/20, MD   2,000 Units at 03/27/20 [phone]  . divalproex (DEPAKOTE SPRINKLE) capsule 125 mg  125 mg Oral BID 3491, MD   125 mg at 03/27/20 0908  . docusate sodium (COLACE) capsule 100 mg  100 mg Oral BID 03/29/20,  MD   100 mg at 03/27/20 0909  . loperamide (IMODIUM) capsule 2 mg  2 mg Oral Daily PRN Yolonda Kida, MD      . OLANZapine [facility]) tablet 2.5 mg  2.5 mg Oral Daily Yolonda Kida, MD   2.5 mg at 03/27/20 0388  . ondansetron (ZOFRAN) tablet 4 mg  4 mg Oral Q6H PRN Yolonda Kida, MD       Or  . ondansetron [facility]) injection 4 mg  4 mg Intravenous Q6H PRN Yolonda Kida, MD      . oxyCODONE (Oxy IR/ROXICODONE) immediate release  tablet 2.5 mg  2.5 mg Oral Q6H PRN McDiarmid, Leighton Roach, MD      . polyethylene glycol (MIRALAX / GLYCOLAX) packet 17 g  17 g Oral Daily PRN Yolonda Kida, MD         Discharge Medications: Please see discharge summary for a list of discharge medications.  Relevant Imaging Results:  Relevant Lab Results:   Additional Information SSN: [id number]  Carley Hammed, [place]

## 2020-03-27 NOTE — Hospital Course (Addendum)
Joann [name] is a 87 y.o. female presenting with fracture of right intertrochanteric right femur. PMH is significant for pernicious anemia and pancytopenia 2/2 B12 deficiency, Alzheimer's dementia, tobacco use, and osteopenia.  Combined fracture of right intertrochanteric femur  In ED femur x-rays demonstrated comminuted fracture of right intertrochanteric femur with mild apex lateral angulation and possible non-displaced fracture of the right inferior pubic ramus. Pain controlled with scheduled Tylenol and Morphine PRN. Ortho consulted and patient underwent intramedullary implant with Ortho on 2/27 without complications. She was started on with ASA 81 mg daily following operation for VTE ppx. Post-op pain controlled with scheduled Tylenol q6h and PRN Oxy 2.5 mg q6h. PT/OT consulted during hospitalization and patient was recommended for SNF.  Alzheimer's Dementia  Goals of Care: DNR/DNI She was continued on her home medications. Given her advanced dementia on top of her acute fracture and fall, palliative care consult was placed to discuss goals of care with patients family. It was decided that she would be DNR/DNI and with no artificial feeding.    Other chronic conditions stable.    Issues for follow-up: Consider bisphosphonate OP- start now prior to d/c- 70 once weekly Aspirin 10-14 d F/u for staple removal in 2 weeks with ortho Discontinued torsemide in the hospital and upon discharge. Recommend compression stockings for peripheral edema.

## 2020-03-27 NOTE — Plan of Care (Signed)
  Problem: Education: Goal: Knowledge of General Education information will improve Description: Including pain rating scale, medication(s)/side effects and non-pharmacologic comfort measures Outcome: Progressing   Problem: Clinical Measurements: Goal: Ability to maintain clinical measurements within normal limits will improve Outcome: Progressing Goal: Respiratory complications will improve Outcome: Progressing   

## 2020-03-27 NOTE — TOC Initial Note (Signed)
Transition of Care [facility]) - Initial/Assessment Note    Patient Details  Name: Joann [name] MRN: [id number] Date of Birth: 1927/05/29  Transition of [facility]) CM/SW Contact:    Carley Hammed, LCSWA Phone Number: 03/27/2020, 11:10 AM  Clinical Narrative:                 CSW noted pt is disoriented at this time and has a history of dementia, CSW contacted daughter/ [facility]. Mitzi Davenport confirmed that pt is from an ALF, [facility], and she was advised pt may require short term rehab before returning. Mitzi Davenport had concerns about her mother going to a different facility due to her dementia, but understands the necessity. Mitzi Davenport advised that her and her brother live around [place] and she would like for pt to stay in [place], but would also like the best care she can get for her mother. She agreed to be faxed out beyond [facility] for options. Pt has been vaccinated and boosted for covid. CSW will complete workup and faxout as soon as a PT recommendation is made. TOC will continue to follow.  Expected Discharge Plan: Skilled Nursing Facility Barriers to Discharge: Continued Medical Work up,Insurance Authorization,SNF Pending bed offer   Patient Goals and CMS Choice Patient states their goals for this hospitalization and ongoing recovery are:: Pt is unable to participate in goal planning due to disorientation. CMS Medicare.gov Compare Post Acute Care list provided to:: Patient Represenative (must comment) (Daughter) Choice offered to / list presented to : Adult Children,HC POA / Guardian  Expected Discharge Plan and Services Expected Discharge Plan: Skilled Nursing Facility     Post Acute Care Choice: Skilled Nursing Facility Living arrangements for the past 2 months: Assisted Living Facility                                      Prior Living Arrangements/Services Living arrangements for the past 2 months: Assisted Living Facility Lives with:: Facility Resident Patient  language and need for interpreter reviewed:: Yes Do you feel safe going back to the place where you live?: Yes      Need for Family Participation in Patient Care: Yes (Comment) Care giver support system in place?: Yes (comment)   Criminal Activity/Legal Involvement Pertinent to Current Situation/Hospitalization: No - Comment as needed  Activities of Daily Living Home Assistive Devices/Equipment: None ADL Screening (condition at time of admission) Patient's cognitive ability adequate to safely complete daily activities?: No Is the patient deaf or have difficulty hearing?: Yes Does the patient have difficulty seeing, even when wearing glasses/contacts?: Yes Does the patient have difficulty concentrating, remembering, or making decisions?: Yes Patient able to express need for assistance with ADLs?: Yes Does the patient have difficulty dressing or bathing?: Yes Independently performs ADLs?: No Feeding: Needs assistance Does the patient have difficulty walking or climbing stairs?: Yes Weakness of Legs: Both Weakness of Arms/Hands: Both  Permission Sought/Granted Permission sought to share information with : Family Supports Permission granted to share information with : Yes, Verbal Permission Granted  Share Information with NAME: Melaine Mcphee  Permission granted to share info w AGENCY: [facility]  Permission granted to share info w Relationship: Daughter  Permission granted to share info w Contact Information: [phone]  Emotional Assessment Appearance:: Appears stated age Attitude/Demeanor/Rapport: Unable to Assess Affect (typically observed): Unable to Assess Orientation: : Oriented to Self Alcohol / Substance Use: Not Applicable Psych Involvement:  No (comment)  Admission diagnosis:  Fall [W19.XXXA] Intertrochanteric fracture of right femur [facility]) [S72.141A] Patient Active Problem List   Diagnosis Date Noted  . Fall   . S/P right hip fracture   . Intertrochanteric  fracture of right femur (HCC) 03/25/2020  . Malnutrition of moderate degree 01/20/2019  . Anemia 09/01/2018  . Venous insufficiency of both lower extremities with edema 11/09/2015  . Vitamin B12 deficiency 04/28/2015  . Benign Ethnic Leukopenia 04/28/2015  . Stroke, lacunar (HCC) 04/28/2015  . Mixed Alzheimer's and vascular dementia (HCC) 03/23/2015  . Vitamin D deficiency 06/13/2011  . ANEMIA, PERNICIOUS 09/21/2007  . HYPERLIPIDEMIA 03/27/2006  . TOBACCO DEPENDENCE 03/27/2006  . NEUROPATHY, PERIPHERAL 03/27/2006  . Osteoporosis 03/27/2006   PCP:  Lavonda Jumbo, DO Pharmacy:   [facility] [place], [place] - [address] [facility]. [address]. [place] [address] Phone: [phone] Fax: [phone]     Social Determinants of Health (SDOH) Interventions    Readmission Risk Interventions No flowsheet data found.

## 2020-03-27 NOTE — Progress Notes (Signed)
Initial Nutrition Assessment  DOCUMENTATION CODES:   Not applicable  INTERVENTION:   -Ensure Enlive po BID, each supplement provides 350 kcal and 20 grams of protein -MVI with minerals daily  NUTRITION DIAGNOSIS:   Increased nutrient needs related to post-op healing as evidenced by estimated needs.  GOAL:   Patient will meet greater than or equal to 90% of their needs  MONITOR:   PO intake,Supplement acceptance,Labs,Weight trends,Skin,I & O's  REASON FOR ASSESSMENT:   Consult Assessment of nutrition requirement/status,Hip fracture protocol  ASSESSMENT:   Joann [name] is a 87 y.o. female presenting with fracture of right intertrochanteric right femur. PMH is significant for pernicious anemia and pancytopenia 2/2 B12 deficiency, Alzheimer's dementia, tobacco use, and osteopenia.  Pt admitted with rt intertochanteric femur fracture  2/27- s/p PROCEDURE: Treatment of intertrochanteric, pertrochanteric, subtrochanteric fracture with intramedullary implant  Reviewed I/O's: +1 L x 24 hours  Spoke with pt at bedside, who was pleasant and in good spirits today. Pt reports she has a good appetite and consumed all of her breakfast (noted lunch meal on tray table, which was untouched). Documented meal completion 50%.   Per pt, she typically consumes 2 meals per day (late breakfast and dinner). Pt denies any difficulty chewing or swallowing foods despite missing teeth.   Pt denies any weight loss. She is unsure of UBW. Reviewed wt hx; wt has been stable over the past year.   Discussed with pt importance of good meal and supplement intake to promote healing.   Medications reviewed and include calcium carbonate, vitamin D3, and colace.   Labs reviewed.   NUTRITION - FOCUSED PHYSICAL EXAM:  Flowsheet Row Most Recent Value  Orbital Region No depletion  Upper Arm Region No depletion  Thoracic and Lumbar Region No depletion  Buccal Region No depletion  Temple Region No depletion   Clavicle Bone Region Mild depletion  Clavicle and Acromion Bone Region Mild depletion  Scapular Bone Region Mild depletion  Dorsal Hand No depletion  Patellar Region Mild depletion  Anterior Thigh Region Mild depletion  Posterior Calf Region Mild depletion  Edema (RD Assessment) Mild  Hair Reviewed  Eyes Reviewed  Mouth Reviewed  Skin Reviewed  Nails Reviewed       Diet Order:   Diet Order            Diet Heart Room service appropriate? Yes; Fluid consistency: Thin  Diet effective now                 EDUCATION NEEDS:   Education needs have been addressed  Skin:  Skin Assessment: Skin Integrity Issues: Skin Integrity Issues:: Incisions Incisions: closed rt thigh  Last BM:  Unknown  Height:   Ht Readings from Last 1 Encounters:  03/25/20 5\' 5"  (1.651 m)    Weight:   Wt Readings from Last 1 Encounters:  03/25/20 56.5 kg    Ideal Body Weight:  56.8 kg  BMI:  Body mass index is 20.73 kg/m.  Estimated Nutritional Needs:   Kcal:  1650-1850  Protein:  80-95 grams  Fluid:  > 1.7 L    03/27/20, RD, LDN, CDCES Registered Dietitian II Certified Diabetes Care and Education Specialist Please refer to [facility] for RD and/or RD on-call/weekend/after hours pager

## 2020-03-28 ENCOUNTER — Encounter (HOSPITAL_COMMUNITY): Payer: Self-pay | Admitting: Orthopedic Surgery

## 2020-03-28 DIAGNOSIS — I872 Venous insufficiency (chronic) (peripheral): Secondary | ICD-10-CM | POA: Diagnosis not present

## 2020-03-28 DIAGNOSIS — G309 Alzheimer's disease, unspecified: Secondary | ICD-10-CM | POA: Diagnosis not present

## 2020-03-28 DIAGNOSIS — M810 Age-related osteoporosis without current pathological fracture: Secondary | ICD-10-CM | POA: Diagnosis not present

## 2020-03-28 DIAGNOSIS — S72144D Nondisplaced intertrochanteric fracture of right femur, subsequent encounter for closed fracture with routine healing: Secondary | ICD-10-CM | POA: Diagnosis not present

## 2020-03-28 DIAGNOSIS — F0150 Vascular dementia without behavioral disturbance: Secondary | ICD-10-CM | POA: Diagnosis not present

## 2020-03-28 DIAGNOSIS — Z7189 Other specified counseling: Secondary | ICD-10-CM | POA: Diagnosis not present

## 2020-03-28 LAB — CBC
HCT: 28.2 % — ABNORMAL LOW (ref 36.0–46.0)
Hemoglobin: 9 g/dL — ABNORMAL LOW (ref 12.0–15.0)
MCH: 28.3 pg (ref 26.0–34.0)
MCHC: 31.9 g/dL (ref 30.0–36.0)
MCV: 88.7 fL (ref 80.0–100.0)
Platelets: 204 10*3/uL (ref 150–400)
RBC: 3.18 MIL/uL — ABNORMAL LOW (ref 3.87–5.11)
RDW: 12.7 % (ref 11.5–15.5)
WBC: 6.4 10*3/uL (ref 4.0–10.5)
nRBC: 0 % (ref 0.0–0.2)

## 2020-03-28 LAB — BASIC METABOLIC PANEL
Anion gap: 7 (ref 5–15)
BUN: 22 mg/dL (ref 8–23)
CO2: 26 mmol/L (ref 22–32)
Calcium: 8.6 mg/dL — ABNORMAL LOW (ref 8.9–10.3)
Chloride: 103 mmol/L (ref 98–111)
Creatinine, Ser: 1 mg/dL (ref 0.44–1.00)
GFR, Estimated: 54 mL/min — ABNORMAL LOW (ref >60)
Glucose, Bld: 102 mg/dL — ABNORMAL HIGH (ref 70–99)
Potassium: 4 mmol/L (ref 3.5–5.1)
Sodium: 136 mmol/L (ref 135–145)

## 2020-03-28 LAB — SARS CORONAVIRUS 2 (TAT 6-24 HRS): SARS Coronavirus 2: NEGATIVE

## 2020-03-28 NOTE — TOC Progression Note (Signed)
Transition of Care [facility]) - Progression Note    Patient Details  Name: Joann [name] MRN: [id number] Date of Birth: 30-Sep-1927  Transition of Care [facility]) CM/SW Contact  Carley Hammed, [place] Phone Number: 03/28/2020, 4:30 PM  Clinical Narrative:    CSW spoke with dtr and pt at bedside. They confirmed that they had been to [facility] before and had a good experience. They spoke with Olegario Messier at [facility] and chose to go there. Insurance was approved 3/1- 3/3 Id# [id number]. Bed has been confirmed for tomorrow, Covid was ordered, DNR was put on chart and request for DR. To sign. Transport papers are on the chart. SW will continue to follow for DC planning.   Expected Discharge Plan: Skilled Nursing Facility Barriers to Discharge: Continued Medical Work up,Insurance Authorization,SNF Pending bed offer  Expected Discharge Plan and Services Expected Discharge Plan: Skilled Nursing Facility     Post Acute Care Choice: Skilled Nursing Facility Living arrangements for the past 2 months: Assisted Living Facility                                       Social Determinants of Health (SDOH) Interventions    Readmission Risk Interventions No flowsheet data found.

## 2020-03-28 NOTE — Progress Notes (Signed)
Family Medicine Teaching Service Daily Progress Note Intern Pager: [phone]  Patient name: Joann [name] Medical record number: [id number] Date of birth: November 27, 1927 Age: 87 y.o. Gender: female  Primary Care Provider: Lavonda Jumbo, DO Consultants: Ortho Code Status: DNR  Pt Overview and Major Events to Date:  2/26: Admitted   Assessment and Plan:  Joann Huntis a 87 y.o.femalepresenting with fracture of right intertrochanteric right femur. PMH is significant forpernicious anemia and pancytopenia 2/2 B12 deficiency, Alzheimer's dementia, tobacco use, and osteopenia.  Combined Fracture of Right Intertrochanteric Femur  Patient presented to ED complaining of right hip pain afterwitnessedfall at ALF and found to have comminuted fracture of the right intertrochanteric femur with mild apex lateral angulation and possible nondisplaced fracture of the right inferior pubic ramus. Ortho performed  intramedullary nail for the right hip fracture on 2/27.  - Ortho following, appreciate continued care and recommendations - pain medications: Tylenol q 6h, Oxy 2.5mg  q 6h prn (has not used)  - PT/OT - Per Ortho, weight bearing as tolerated.  F/u with Ortho in 2 weeks for staple removal  Alzheimer's Dementia  Diagnosed in 2012. Currently living in ALF. - continue Depakote sprinkle 125mg  BID, Olanzapine 2.5mg  qAM - delirium precautions - monitor mentation  Lower extremity swelling Chronic, at baseline. Home meds: Torsemide 20mg  QD, last given AM 2/26. Endorses good urine output.  - hold torsemide 2/27 due to surgery and rise in Cr. D/c on discharge  - monitor swelling and restart when indicated  Tobacco Use 3-4 cigarettes per day. Daughter declines nicotine patch   Goals of Care Discussed code status with daughter who is healthcare power of attorny and confirmed that patient is DNR.  - palliative consult placed   FEN/GI: Heart healthy  PPx: Aspirin 81mg  daily    Disposition:Pending Ortho. ALF w/ HH vs SNF.  Subjective:  No acute events overnight   Objective: Temp:  [97.8 F (36.6 C)-100 F (37.8 C)] 98.4 F (36.9 C) (03/01 0749) Pulse Rate:  [72-82] 75 (03/01 0749) Resp:  [14-19] 17 (03/01 0749) BP: (90-119)/(51-71) 108/64 (03/01 0749) SpO2:  [95 %-99 %] 97 % (03/01 0749) Physical Exam: General: sitting in bed, NAD Cardiovascular: RRR Respiratory: CTAB normal WOB Abdomen: soft, non distended, non tender Extremities: warm, dry. Distal pulses 2+  Laboratory: Recent Labs  Lab 03/25/20 2042 03/26/20 0115 03/28/20 0155  WBC 6.3 8.6 6.4  HGB 12.6 11.6* 9.0*  HCT 39.6 36.5 28.2*  PLT 294 301 204   Recent Labs  Lab 03/25/20 2042 03/26/20 0115 03/28/20 0155  NA 136 138 136  K 4.4 4.3 4.0  CL 100 100 103  CO2 25 24 26   BUN 21 20 22   CREATININE 1.08* 1.00 1.00  CALCIUM 9.2 9.1 8.6*  GLUCOSE 128* 112* 102*     Imaging/Diagnostic Tests: No results found.  2043, DO 03/28/2020, 8:13 AM PGY-1, [place] FPTS Intern pager: [phone], text pages welcome

## 2020-03-28 NOTE — Care Management Important Message (Signed)
Important Message  Patient Details  Name: Joann [name] MRN: [id number] Date of Birth: August 06, 1927   Medicare Important Message Given:  Yes     Alastair Hennes P Randi Poullard 03/28/2020, 2:30 PM

## 2020-03-28 NOTE — Progress Notes (Signed)
   Daily Progress Note   Patient Name: Joann [name]       Date: 03/28/2020 DOB: 03-07-1927  Age: 87 y.o. MRN#: [id number] Attending Physician: McDiarmid, Blane Ohara, MD Primary Care Physician: Gerlene Fee, DO Admit Date: 03/25/2020  Reason for Consultation/Follow-up: Establishing goals of care  Chart Reviewed. Updates Received. No acute distress.   I spoke with patient's daughter at length and reviewed previous goals of care discussion. Daughter verbalized plans to continue to allow her mother every opportunity to improve. She has designated a rehab facility and states her mother will most likely discharge tomorrow.   Joann [name] has reviewed MOST form. All questions answered. After reviewing each option again daughter confirmed request to complete documentation.   Daughter has confirmed request for outpatient Palliative support.   Joann [name] outlined wishes for the following treatment decisions:  Cardiopulmonary Resuscitation: Do Not Attempt Resuscitation (DNR/No CPR)  Medical Interventions: Comfort Measures: Keep clean, warm, and dry. Use medication by any route, positioning, wound care, and other measures to relieve pain and suffering. Use oxygen, suction and manual treatment of airway obstruction as needed for comfort. Do not transfer to the hospital unless comfort needs cannot be met in current location.  Antibiotics: Determine use of limitation of antibiotics when infection occurs  IV Fluids: IV fluids for a defined trial period  Feeding Tube: No feeding tube    Length of Stay: 3 days  Vital Signs: BP 108/64 (BP Location: Right Arm)   Pulse 75   Temp 98.4 F (36.9 C) (Oral)   Resp 17   Ht $R'5\' 5"'Iw$  (1.651 m)   Wt 56.5 kg   SpO2 97%   BMI 20.73 kg/m  SpO2: SpO2: 97 % O2 Device: O2 Device: Room Air O2 Flow Rate: O2 Flow Rate (L/min): 2 L/min    Palliative Care Assessment & Plan  HPI: Palliative Care consult requested for goals of care discussion in this 87 y.o. female with multiple  medical problems including anemia, pancytopenia, Mixed Alzheimer's and vascular dementia, tobacco use, osteopenia, and hyperlipidemia. She presented to ED from [facility] ALF s/p witness fall with possible hip fracture. X-ray confirmed right hip intertrochanteric fracture. Orthopedic surgery consulted and patient underwent Right hip IM nailing on 03/26/2020.   Code Status: DNR  Goals of Care/Recommendations: DNR/DNI Daughter hopeful for continued improvement/stability for her mother. Patient to discharge to SNF Rehab and outpatient Palliative support. (TOC referral placed).  Education provided on MOST form and document has been completed. See Vynca.  PMT will continue to support and follow as needed.   Prognosis: Guarded   Discharge Planning: [facility] for rehab with Palliative care service follow-up  Thank you for allowing the Palliative Medicine Team to assist in the care of this patient.  Time Total: 45 min.   Visit consisted of counseling and education dealing with the complex and emotionally intense issues of symptom management and palliative care in the setting of serious and potentially life-threatening illness.Greater than 50%  of this time was spent counseling and coordinating care related to the above assessment and plan.  Alda Lea, AGPCNP-BC  Palliative Medicine Team [phone]

## 2020-03-28 NOTE — Progress Notes (Signed)
Appropriate Use Committee Chart Review  Chart reviewed by the physician advisor with input from [facility] and the attending MD as needed for review of the appropriateness for SNF referral.  TOC notes, PT/OT/ST notes, nursing notes and physician notes reviewed for medical necessity to determine if the patient's needs are appropriate for short-term rehab to return to a prior level of function versus the likely need for custodial care.  At this time, the patient meets Medicare criteria for SNF placement.   Recommendations: The patient is SNF appropriate for short-term rehab/skilled nursing interventions.   Joann Aldo, MD Chief Physician Advisor  03/28/2020 11:20 AM

## 2020-03-28 NOTE — Plan of Care (Signed)

## 2020-03-29 DIAGNOSIS — G309 Alzheimer's disease, unspecified: Secondary | ICD-10-CM | POA: Diagnosis not present

## 2020-03-29 DIAGNOSIS — Z8781 Personal history of (healed) traumatic fracture: Secondary | ICD-10-CM | POA: Diagnosis not present

## 2020-03-29 DIAGNOSIS — E785 Hyperlipidemia, unspecified: Secondary | ICD-10-CM | POA: Diagnosis not present

## 2020-03-29 DIAGNOSIS — W19XXXD Unspecified fall, subsequent encounter: Secondary | ICD-10-CM

## 2020-03-29 DIAGNOSIS — L89620 Pressure ulcer of left heel, unstageable: Secondary | ICD-10-CM | POA: Diagnosis not present

## 2020-03-29 DIAGNOSIS — I872 Venous insufficiency (chronic) (peripheral): Secondary | ICD-10-CM | POA: Diagnosis not present

## 2020-03-29 DIAGNOSIS — M6281 Muscle weakness (generalized): Secondary | ICD-10-CM | POA: Diagnosis not present

## 2020-03-29 DIAGNOSIS — F17200 Nicotine dependence, unspecified, uncomplicated: Secondary | ICD-10-CM | POA: Diagnosis not present

## 2020-03-29 DIAGNOSIS — S72141D Displaced intertrochanteric fracture of right femur, subsequent encounter for closed fracture with routine healing: Secondary | ICD-10-CM | POA: Diagnosis not present

## 2020-03-29 DIAGNOSIS — S72144D Nondisplaced intertrochanteric fracture of right femur, subsequent encounter for closed fracture with routine healing: Secondary | ICD-10-CM | POA: Diagnosis not present

## 2020-03-29 DIAGNOSIS — M159 Polyosteoarthritis, unspecified: Secondary | ICD-10-CM | POA: Diagnosis not present

## 2020-03-29 DIAGNOSIS — Z7401 Bed confinement status: Secondary | ICD-10-CM | POA: Diagnosis not present

## 2020-03-29 DIAGNOSIS — D61818 Other pancytopenia: Secondary | ICD-10-CM | POA: Diagnosis not present

## 2020-03-29 DIAGNOSIS — M2550 Pain in unspecified joint: Secondary | ICD-10-CM | POA: Diagnosis not present

## 2020-03-29 DIAGNOSIS — E559 Vitamin D deficiency, unspecified: Secondary | ICD-10-CM | POA: Diagnosis not present

## 2020-03-29 DIAGNOSIS — Z4789 Encounter for other orthopedic aftercare: Secondary | ICD-10-CM | POA: Diagnosis not present

## 2020-03-29 DIAGNOSIS — D510 Vitamin B12 deficiency anemia due to intrinsic factor deficiency: Secondary | ICD-10-CM | POA: Diagnosis not present

## 2020-03-29 DIAGNOSIS — R404 Transient alteration of awareness: Secondary | ICD-10-CM | POA: Diagnosis not present

## 2020-03-29 DIAGNOSIS — Z743 Need for continuous supervision: Secondary | ICD-10-CM | POA: Diagnosis not present

## 2020-03-29 DIAGNOSIS — E538 Deficiency of other specified B group vitamins: Secondary | ICD-10-CM | POA: Diagnosis not present

## 2020-03-29 DIAGNOSIS — M810 Age-related osteoporosis without current pathological fracture: Secondary | ICD-10-CM | POA: Diagnosis not present

## 2020-03-29 DIAGNOSIS — R262 Difficulty in walking, not elsewhere classified: Secondary | ICD-10-CM | POA: Diagnosis not present

## 2020-03-29 DIAGNOSIS — R6889 Other general symptoms and signs: Secondary | ICD-10-CM | POA: Diagnosis not present

## 2020-03-29 DIAGNOSIS — L97415 Non-pressure chronic ulcer of right heel and midfoot with muscle involvement without evidence of necrosis: Secondary | ICD-10-CM | POA: Diagnosis not present

## 2020-03-29 DIAGNOSIS — R1311 Dysphagia, oral phase: Secondary | ICD-10-CM | POA: Diagnosis not present

## 2020-03-29 LAB — CBC
HCT: 29 % — ABNORMAL LOW (ref 36.0–46.0)
Hemoglobin: 9.3 g/dL — ABNORMAL LOW (ref 12.0–15.0)
MCH: 28.4 pg (ref 26.0–34.0)
MCHC: 32.1 g/dL (ref 30.0–36.0)
MCV: 88.7 fL (ref 80.0–100.0)
Platelets: 224 10*3/uL (ref 150–400)
RBC: 3.27 MIL/uL — ABNORMAL LOW (ref 3.87–5.11)
RDW: 12.5 % (ref 11.5–15.5)
WBC: 4.6 10*3/uL (ref 4.0–10.5)
nRBC: 0 % (ref 0.0–0.2)

## 2020-03-29 MED ORDER — ACETAMINOPHEN 500 MG PO TABS: 500.0000 mg | ORAL_TABLET | Freq: Four times a day (QID) | ORAL | 0 refills | Status: AC

## 2020-03-29 MED ORDER — HYDROCODONE-ACETAMINOPHEN 5-325 MG PO TABS: 1.0000 | ORAL_TABLET | Freq: Four times a day (QID) | ORAL | 0 refills | Status: AC | PRN

## 2020-03-29 MED ORDER — CALCIUM CARBONATE 1250 (500 CA) MG PO TABS: 1.0000 | ORAL_TABLET | Freq: Two times a day (BID) | ORAL | Status: AC

## 2020-03-29 MED ORDER — ASPIRIN EC 81 MG PO TBEC: 81.0000 mg | DELAYED_RELEASE_TABLET | Freq: Every day | ORAL | 0 refills | Status: AC

## 2020-03-29 NOTE — Progress Notes (Signed)
Occupational Therapy Treatment Patient Details Name: Joann [name] MRN: [id number] DOB: Jun 19, 1927 Today's Date: 03/29/2020    History of present illness Joann [name] is a 87 y.o. female who complains of right hip pain following a fall prior to arrival evening of 2/26.  She sustained a fall at her assisted living facility.  She does have Alzheimer's dementia at baseline as well as pancytopenia.  She was noted on work-up in the ER to have an intertrochanteric fracture of the right hip.  Orthopedic surgery consulted for treatment recommendations.  She does not remember the exact details of the event but this was a witnessed fall.  No history of previous surgery.  Denies diabetes. Pt is now s/p R hip IM nailing (WBAT RLE).   OT comments  Pt making progress with functional goals. OT will continue to follow acutely to maximize level of function and safety  Follow Up Recommendations  SNF;Supervision/Assistance - 24 hour    Equipment Recommendations  Other (comment) (TBD at SNF)    Recommendations for Other Services      Precautions / Restrictions Precautions Precautions: Fall Precaution Comments: WBAT RLE Restrictions Weight Bearing Restrictions: Yes RLE Weight Bearing: Weight bearing as tolerated       Mobility Bed Mobility Overal bed mobility: Needs Assistance Bed Mobility: Supine to Sit     Supine to sit: Mod assist;+2 for safety/equipment     General bed mobility comments: assist with LEs and to elevate trunk, pt required max encouragement to initiate participation    Transfers Overall transfer level: Needs assistance Equipment used: 2 person hand held assist Transfers: Sit to/from [employer] Sit to Stand: Max assist;+2 physical assistance;+2 safety/equipment Stand pivot transfers: Max assist;+2 physical assistance            Balance Overall balance assessment: Needs assistance Sitting-balance support: Feet supported;No upper extremity supported Sitting  balance-Leahy Scale: Good     Standing balance support: Bilateral upper extremity supported;During functional activity Standing balance-Leahy Scale: Poor                             ADL either performed or assessed with clinical judgement   ADL   Eating/Feeding: Modified independent;Sitting   Grooming: Wash/dry face;Set up;Sitting;Wash/dry hands           Upper Body Dressing : Minimal assistance;Sitting Upper Body Dressing Details (indicate cue type and reason): donned clean gown seated in recliner     Toilet Transfer: Maximal assistance;+2 for physical assistance;+2 for safety/equipment;Cueing for safety;Cueing for sequencing;Stand-pivot;BSC;RW   Toileting- Clothing Manipulation and Hygiene: Total assistance;Sit to/from stand       Functional mobility during ADLs: Maximal assistance;+2 for physical assistance;+2 for safety/equipment;Cueing for safety;Cueing for sequencing;Rolling walker       Vision Baseline Vision/History: Wears glasses Patient Visual Report: No change from baseline     Perception     Praxis      Cognition Arousal/Alertness: Awake/alert Behavior During Therapy: WFL for tasks assessed/performed Overall Cognitive Status: History of cognitive impairments - at baseline                                 General Comments: dementia at baseline        Exercises     Shoulder Instructions       General Comments      Pertinent Vitals/ Pain       Pain Assessment:  Faces Faces Pain Scale: Hurts a little bit Pain Location: R hip with movement Pain Descriptors / Indicators: Grimacing Pain Intervention(s): Monitored during session;Repositioned  Home Living Family/patient expects to be discharged to:: Skilled nursing facility                                 Additional Comments: return to previous SNF with 24/7 Sup and assist      Prior Functioning/Environment              Frequency  Min 2X/week         Progress Toward Goals  OT Goals(current goals can now be found in the care plan section)  Progress towards OT goals: Progressing toward goals     Plan Discharge plan remains appropriate;Frequency remains appropriate    Co-evaluation                 AM-PAC OT "6 Clicks" Daily Activity     Outcome Measure   Help from another person eating meals?: None Help from another person taking care of personal grooming?: A Little Help from another person toileting, which includes using toliet, bedpan, or urinal?: Total Help from another person bathing (including washing, rinsing, drying)?: A Lot Help from another person to put on and taking off regular upper body clothing?: A Little Help from another person to put on and taking off regular lower body clothing?: Total 6 Click Score: 14    End of Session Equipment Utilized During Treatment: Gait belt  OT Visit Diagnosis: Unsteadiness on feet (R26.81);History of falling (Z91.81);Pain;Muscle weakness (generalized) (M62.81) Pain - Right/Left: Right Pain - part of body: Leg   Activity Tolerance Patient tolerated treatment well;Patient limited by fatigue   Patient Left with call bell/phone within reach;in chair;with chair alarm set   Nurse Communication          Time: 8546-2703 OT Time Calculation (min): 23 min  Charges: OT General Charges $OT Visit: 1 Visit OT Treatments $Self Care/Home Management : 8-22 mins     Margaretmary Eddy [facility] 03/29/2020, 2:07 PM

## 2020-03-29 NOTE — Discharge Instructions (Signed)
Orthopedic discharge instructions:  -Okay for full weightbearing as tolerated to the right lower extremity.  Use assistive device as needed.  -Maintain postoperative bandages until your follow-up appointment.  You may begin showering on postoperative day #2.  If these bandages become saturated you may remove and replace with daily dry dressings.  -Apply ice to the right hip for 20 to 30 min out of each hour that you are awake and able.  -For mild to moderate pain use Tylenol and ibuprofen around-the-clock.  For any breakthrough pain use hydrocodone 5 mg x 325 mg tablets as needed every 6 hours.  - Continue aspirin for 10-14 days   -Return to see Dr. Aundria Rud in the office in 2 weeks.  - Follow up with [facility] after discharge from skilled nursing facility

## 2020-03-29 NOTE — Progress Notes (Signed)
Pt was sitting in the chair for few hours today. Right hip incision with staples dry and intact, open to air.  Report was given to Kim at [facility]. Discharged pt to SNF via PTAR.

## 2020-03-29 NOTE — Progress Notes (Signed)
   Subjective: Today, patient reports no pain. Denies numbness or tingling. Denies fever or chills. No concerns.   Date of Surgery: 03/26/2020   INDICATIONS: Ms. Soulliere is a 87 y.o.-year-old female who sustained a right hip fracture. The risks and benefits of the procedure discussed with the patient and caregiver prior to the procedure and all questions were answered; consent was obtained.   PREOPERATIVE DIAGNOSIS: right hip fracture    POSTOPERATIVE DIAGNOSIS: Same    PROCEDURE: Treatment of intertrochanteric, pertrochanteric, subtrochanteric fracture with intramedullary implant. CPT [id number]    SURGEON: Kathi Der. Aundria Rud, M.D.  Objective:   VITALS:   Vitals:   03/28/20 1319 03/28/20 2025 03/28/20 2027 03/29/20 0750  BP: 106/60 (!) 99/49 (!) 99/49 (!) 103/55  Pulse: 67 89 84 75  Resp: 17  16 17   Temp: 100.3 F (37.9 C) 98.4 F (36.9 C) 98.4 F (36.9 C) 99.2 F (37.3 C)  TempSrc: Oral Oral  Oral  SpO2: 99% 96% 98% 98%  Weight:      Height:       Sitting in hospital chair  Right Lower Extremity:  Neurologically intact Neurovascular intact Sensation intact distally Intact pulses distally Dorsiflexion/Plantar flexion intact Incision: dressing C/D/I  Calf soft.  Knee Rom 15-100, slight difficulty with knee extension.  Cap refill less than 2 seconds.   Lab Results  Component Value Date   WBC 4.6 03/29/2020   HGB 9.3 (L) 03/29/2020   HCT 29.0 (L) 03/29/2020   MCV 88.7 03/29/2020   PLT 224 03/29/2020   BMET    Component Value Date/Time   NA 136 03/28/2020 0155   NA 139 09/08/2018 1748   K 4.0 03/28/2020 0155   CL 103 03/28/2020 0155   CO2 26 03/28/2020 0155   GLUCOSE 102 (H) 03/28/2020 0155   BUN 22 03/28/2020 0155   BUN 18 09/08/2018 1748   CREATININE 1.00 03/28/2020 0155   CREATININE 0.66 04/27/2015 1600   CALCIUM 8.6 (L) 03/28/2020 0155   GFRNONAA 54 (L) 03/28/2020 0155   GFRNONAA 81 04/27/2015 1600   GFRAA >60 01/17/2019 0442   GFRAA >89 04/27/2015 1600      Assessment/Plan: 3 Days Post-Op   Principal Problem:   Intertrochanteric fracture of right femur (HCC) Active Problems:   Osteoporosis   Mixed Alzheimer's and vascular dementia (HCC)   Venous insufficiency of both lower extremities with edema   Fall   S/P right hip fracture   Advance diet Up with therapy D/C per primary to SNF per PT notes  Advised to work on knee extension.  Dressings clean, change as needed if saturated with nonadherent dressing, tape.   Weightbearing Status: WBAT RLE DVT Prophylaxis: ASA 81mg   Ortho will follow along.   02[birth year]23[birth year]1970 03/29/2020, 3:36 PM  Arbie Cookey PA-C  Physician Assistant with Dr. 05/29/2020 [place]

## 2020-03-29 NOTE — Progress Notes (Signed)
Physical Therapy Treatment Patient Details Name: Joann [name] MRN: [id number] DOB: [birth year] Today's Date: 03/29/2020    History of Present Illness Joann [name] is a 87 y.o. female who complains of right hip pain following a fall prior to arrival evening of 2/26.  She sustained a fall at her assisted living facility.  She does have Alzheimer's dementia at baseline as well as pancytopenia.  She was noted on work-up in the ER to have an intertrochanteric fracture of the right hip.  Orthopedic surgery consulted for treatment recommendations.  She does not remember the exact details of the event but this was a witnessed fall.  No history of previous surgery.  Denies diabetes. Pt is now s/p R hip IM nailing (WBAT RLE).    PT Comments    Continuing work on functional mobility and activity tolerance;  Session focused on transfers, and pt needed Max assist of 2 for pivot OOB to recliner placed on her nonoperative side; Slow progress; Snf for rehab is an appropriate plan to help pt work back up to PLOF   Follow Up Recommendations  SNF     Equipment Recommendations  Rolling walker with 5" wheels;3in1 (PT)    Recommendations for Other Services       Precautions / Restrictions Precautions Precautions: Fall Precaution Comments: WBAT RLE Restrictions Weight Bearing Restrictions: No RLE Weight Bearing: Weight bearing as tolerated    Mobility  Bed Mobility Overal bed mobility: Needs Assistance Bed Mobility: Supine to Sit     Supine to sit: Mod assist;+2 for safety/equipment     General bed mobility comments: assist with LEs and to elevate trunk, pt required max encouragement to initiate participation    Transfers Overall transfer level: Needs assistance Equipment used: 2 person hand held assist Transfers: Sit to/from [employer] Transfers Sit to Stand: Max assist;+2 physical assistance;+2 safety/equipment Stand pivot transfers: Max assist;+2 physical assistance       General  transfer comment: Max assist to rise, and for stabiltiy during pivot transfer to recliner on pt's L side  Ambulation/Gait                 Stairs             Wheelchair Mobility    Modified Rankin (Stroke Patients Only)       Balance Overall balance assessment: Needs assistance Sitting-balance support: Feet supported;No upper extremity supported Sitting balance-Leahy Scale: Good     Standing balance support: Bilateral upper extremity supported;During functional activity Standing balance-Leahy Scale: Poor                              Cognition Arousal/Alertness: Awake/alert Behavior During Therapy: WFL for tasks assessed/performed Overall Cognitive Status: History of cognitive impairments - at baseline                                 General Comments: dementia at baseline      Exercises      General Comments        Pertinent Vitals/Pain Pain Assessment: Faces Faces Pain Scale: Hurts a little bit Pain Location: R hip with movement Pain Descriptors / Indicators: Grimacing Pain Intervention(s): Monitored during session    Home Living Family/patient expects to be discharged to:: Skilled nursing facility               Additional Comments: return to previous SNF with  24/7 Sup and assist    Prior Function            PT Goals (current goals can now be found in the care plan section) Acute Rehab PT Goals Patient Stated Goal: Did not state PT Goal Formulation: Patient unable to participate in goal setting Time For Goal Achievement: 04/10/20 Potential to Achieve Goals: Fair Progress towards PT goals: Progressing toward goals (slowly)    Frequency    Min 2X/week      PT Plan Current plan remains appropriate    Co-evaluation PT/OT/SLP Co-Evaluation/Treatment: Yes Reason for Co-Treatment: For patient/therapist safety PT goals addressed during session: Mobility/safety with mobility        AM-PAC PT "6  Clicks" Mobility   Outcome Measure  Help needed turning from your back to your side while in a flat bed without using bedrails?: A Lot Help needed moving from lying on your back to sitting on the side of a flat bed without using bedrails?: A Lot Help needed moving to and from a bed to a chair (including a wheelchair)?: A Lot Help needed standing up from a chair using your arms (e.g., wheelchair or bedside chair)?: A Lot Help needed to walk in hospital room?: Total Help needed climbing 3-5 steps with a railing? : Total 6 Click Score: 10    End of Session Equipment Utilized During Treatment: Gait belt Activity Tolerance: Patient tolerated treatment well Patient left: in chair;with call bell/phone within reach;with chair alarm set Nurse Communication: Mobility status PT Visit Diagnosis: Unsteadiness on feet (R26.81);Other abnormalities of gait and mobility (R26.89)     Time: 5188-4166 PT Time Calculation (min) (ACUTE ONLY): 23 min  Charges:  $Therapeutic Activity: 8-22 mins                     Van Clines, PT  Acute Rehabilitation Services Pager [phone] Office [phone]    Levi Aland 03/29/2020, 2:41 PM

## 2020-03-29 NOTE — TOC Transition Note (Signed)
Transition of Care [facility]) - CM/SW Discharge Note   Patient Details  Name: Joann [name] MRN: [id number] Date of Birth: 08/30/1927  [facility]) CM/SW Contact:  Deatra Robinson, LCSW Phone Number: 03/29/2020, 2:10 PM   Clinical Narrative: Pt for dc to [facility] today. Spoke to [place] in admissions who confirmed they are prepared to admit pt to room 103 today. Notified pt's dtr Mitzi Davenport who is agreeable to dc plan. PTAR arranged and RN provided with number for report. SW signing off at dc.   Dellie Burns, MSW, LCSW [phone] (coverage)       Final next level of care: Skilled Nursing Facility Barriers to Discharge: No Barriers Identified   Patient Goals and CMS Choice Patient states their goals for this hospitalization and ongoing recovery are:: Pt is unable to participate in goal planning due to disorientation. CMS Medicare.gov Compare Post Acute Care list provided to:: Patient Represenative (must comment) (Daughter) Choice offered to / list presented to : Adult Children  Discharge Placement              Patient [facility] bed at: [facility] Patient to be transferred to facility by: PTAR Name of family member notified: [name]/Dtr Patient and family notified of of transfer: 03/29/20  Discharge Plan and Services     Post Acute Care Choice: Skilled Nursing Facility                               Social Determinants of Health (SDOH) Interventions     Readmission Risk Interventions No flowsheet data found.

## 2020-03-29 NOTE — Discharge Summary (Addendum)
Family Medicine Teaching [facility] Discharge Summary  Patient name: Joann [name] Medical record number: [id number] Date of birth: [birth year] Age: 87 y.o. Gender: female Date of Admission: 03/25/2020  Date of Discharge: 03/29/20 Admitting Physician: Joana Reamer, DO  Primary Care Provider: Lavonda Jumbo, DO Consultants: Ortho  Indication for Hospitalization: fracture of right intertrochanteric right femur.   Discharge Diagnoses/Problem List:  R hip fracture  Alzheimer's dementia  Disposition: SNF   Discharge Condition: stable  Discharge Exam:  Temp:  [98.4 F (36.9 C)-100.3 F (37.9 C)] 99.2 F (37.3 C) (03/02 0750) Pulse Rate:  [67-89] 75 (03/02 0750) Resp:  [16-17] 17 (03/02 0750) BP: (99-106)/(49-60) 103/55 (03/02 0750) SpO2:  [96 %-99 %] 98 % (03/02 0750) Physical Exam: General: laying in bed, NAD Cardiovascular: RRR  Respiratory: CTAB. Normal WOB   Abdomen: soft, non distended. Non tender to palpation Extremities: warm, dry. SCDs in place. Distal pulses 2+   Brief Hospital Course:   Joann [name] is a 87 y.o. female presenting with fracture of right intertrochanteric right femur. PMH is significant for pernicious anemia and pancytopenia 2/2 B12 deficiency, Alzheimer's dementia, tobacco use, and osteopenia.  Combined fracture of right intertrochanteric femur  In ED femur x-rays demonstrated comminuted fracture of right intertrochanteric femur with mild apex lateral angulation and possible non-displaced fracture of the right inferior pubic ramus. Pain controlled with scheduled Tylenol and Morphine PRN. Ortho consulted and patient underwent intramedullary implant with Ortho on 2/27 without complications. She was started on with ASA 81 mg daily following operation for VTE ppx. Post-op pain controlled with scheduled Tylenol q6h and PRN Oxy 2.5 mg q6h. PT/OT consulted during hospitalization and patient was recommended for SNF.  Alzheimer's Dementia   Goals of Care:  DNR/DNI She was continued on her home medications. Given her advanced dementia on top of her acute fracture and fall, palliative care consult was placed to discuss goals of care with patients family. It was decided that she would be DNR/DNI and with no artificial feeding.   Other chronic conditions stable.   Issues for Follow Up:  1. Continue Aspirin for 10- 14 days 2. Follow-up for staple removal with Ortho in 2 weeks 3. Torsemide discontinued.  Recommend compression stockings for peripheral edema.  Significant Procedures: intramedullary nail implant  Significant Labs and Imaging:  Recent Labs  Lab 03/26/20 0115 03/28/20 0155 03/29/20 1031  WBC 8.6 6.4 4.6  HGB 11.6* 9.0* 9.3*  HCT 36.5 28.2* 29.0*  PLT 301 204 224   Recent Labs  Lab 03/25/20 2042 03/26/20 0115 03/28/20 0155  NA 136 138 136  K 4.4 4.3 4.0  CL 100 100 103  CO2 25 24 26   GLUCOSE 128* 112* 102*  BUN 21 20 22   CREATININE 1.08* 1.00 1.00  CALCIUM 9.2 9.1 8.6*    Results/Tests Pending at Time of Discharge: none  Discharge Medications:  Allergies as of 03/29/2020      Reactions   Sulfonamide Derivatives Other (See Comments)   Exact reaction not recalled- patient remarked it made her "not feel well"      Medication List    STOP taking these medications   guaifenesin 100 MG/5ML syrup Commonly known as: ROBITUSSIN   loperamide 2 MG capsule Commonly known as: IMODIUM   torsemide 20 MG tablet Commonly known as: DEMADEX     TAKE these medications   acetaminophen 500 MG tablet Commonly known as: TYLENOL Take 1 tablet (500 mg total) by mouth every 6 (six) hours. What changed:  when to take this  reasons to take this   aspirin EC 81 MG tablet Take 1 tablet (81 mg total) by mouth daily. Swallow whole.   calcium carbonate 1250 (500 Ca) MG tablet Commonly known as: OS-CAL - dosed in mg of elemental calcium Take 1 tablet (500 mg of elemental calcium total) by mouth 2 (two) times daily with a  meal.   divalproex 125 MG capsule Commonly known as: DEPAKOTE SPRINKLE Take 125 mg by mouth 2 (two) times daily.   HYDROcodone-acetaminophen 5-325 MG tablet Commonly known as: Norco Take 1 tablet by mouth every 6 (six) hours as needed for moderate pain.   Milk of Magnesia 400 MG/5ML suspension Generic drug: magnesium hydroxide Take 30 mLs by mouth at bedtime as needed for mild constipation.   OLANZapine 2.5 MG tablet Commonly known as: ZYPREXA Take 2.5 mg by mouth at bedtime.   Triple Antibiotic [id number] Oint Apply 1 application topically See admin instructions. Apply to affected area(s) after cleaning with normal saline, then cover with gauze and tape or Band-Aid(s)- until healed   Vitamin D3 50 MCG (2000 UT) capsule Take 2,000 Units by mouth daily.       Discharge Instructions: Please refer to Patient Instructions section of EMR for full details.  Patient was counseled important signs and symptoms that should prompt return to medical care, changes in medications, dietary instructions, activity restrictions, and follow up appointments.   Follow-Up Appointments:  Contact information for follow-up providers    Yolonda Kida, MD In 2 weeks.   Specialty: Orthopedic Surgery Why: For suture removal, For wound re-check Contact information: [address] [place] [address] [phone]        Lavonda Jumbo, DO. Schedule an appointment as soon as possible for a visit.   Specialty: Family Medicine Why: Call for appointment after discharge from skilled nursing facility Contact information: [address] [place] [address] [phone]            Contact information for after-discharge care    Destination    [facility] Preferred SNF .   Service: Skilled Nursing Contact information: [address] [place] [address] [phone]                  Cora Collum, DO 03/29/2020, 1:07  PM PGY-1, [facility] Family Medicine

## 2020-04-11 DIAGNOSIS — M6281 Muscle weakness (generalized): Secondary | ICD-10-CM | POA: Diagnosis not present

## 2020-04-11 DIAGNOSIS — L97415 Non-pressure chronic ulcer of right heel and midfoot with muscle involvement without evidence of necrosis: Secondary | ICD-10-CM | POA: Diagnosis not present

## 2020-04-11 DIAGNOSIS — L89620 Pressure ulcer of left heel, unstageable: Secondary | ICD-10-CM | POA: Diagnosis not present

## 2020-04-14 DIAGNOSIS — M6281 Muscle weakness (generalized): Secondary | ICD-10-CM | POA: Diagnosis not present

## 2020-04-14 DIAGNOSIS — S72141D Displaced intertrochanteric fracture of right femur, subsequent encounter for closed fracture with routine healing: Secondary | ICD-10-CM | POA: Diagnosis not present

## 2020-04-14 DIAGNOSIS — Z4789 Encounter for other orthopedic aftercare: Secondary | ICD-10-CM | POA: Diagnosis not present

## 2020-04-18 DIAGNOSIS — S72141D Displaced intertrochanteric fracture of right femur, subsequent encounter for closed fracture with routine healing: Secondary | ICD-10-CM | POA: Diagnosis not present

## 2020-04-18 DIAGNOSIS — Z720 Tobacco use: Secondary | ICD-10-CM | POA: Diagnosis not present

## 2020-04-18 DIAGNOSIS — G309 Alzheimer's disease, unspecified: Secondary | ICD-10-CM | POA: Diagnosis not present

## 2020-04-21 DIAGNOSIS — M6281 Muscle weakness (generalized): Secondary | ICD-10-CM | POA: Diagnosis not present

## 2020-04-21 DIAGNOSIS — L89626 Pressure-induced deep tissue damage of left heel: Secondary | ICD-10-CM | POA: Diagnosis not present

## 2020-04-21 DIAGNOSIS — L89616 Pressure-induced deep tissue damage of right heel: Secondary | ICD-10-CM | POA: Diagnosis not present

## 2020-04-21 DIAGNOSIS — L97415 Non-pressure chronic ulcer of right heel and midfoot with muscle involvement without evidence of necrosis: Secondary | ICD-10-CM | POA: Diagnosis not present

## 2020-04-26 DIAGNOSIS — L89626 Pressure-induced deep tissue damage of left heel: Secondary | ICD-10-CM | POA: Diagnosis not present

## 2020-04-26 DIAGNOSIS — L97415 Non-pressure chronic ulcer of right heel and midfoot with muscle involvement without evidence of necrosis: Secondary | ICD-10-CM | POA: Diagnosis not present

## 2020-04-26 DIAGNOSIS — L89616 Pressure-induced deep tissue damage of right heel: Secondary | ICD-10-CM | POA: Diagnosis not present

## 2020-04-26 DIAGNOSIS — M6281 Muscle weakness (generalized): Secondary | ICD-10-CM | POA: Diagnosis not present

## 2020-05-02 DIAGNOSIS — L89626 Pressure-induced deep tissue damage of left heel: Secondary | ICD-10-CM | POA: Diagnosis not present

## 2020-05-02 DIAGNOSIS — L97415 Non-pressure chronic ulcer of right heel and midfoot with muscle involvement without evidence of necrosis: Secondary | ICD-10-CM | POA: Diagnosis not present

## 2020-05-02 DIAGNOSIS — L89616 Pressure-induced deep tissue damage of right heel: Secondary | ICD-10-CM | POA: Diagnosis not present

## 2020-05-02 DIAGNOSIS — M6281 Muscle weakness (generalized): Secondary | ICD-10-CM | POA: Diagnosis not present

## 2020-05-05 DIAGNOSIS — L89624 Pressure ulcer of left heel, stage 4: Secondary | ICD-10-CM | POA: Diagnosis not present

## 2020-05-09 DIAGNOSIS — L89616 Pressure-induced deep tissue damage of right heel: Secondary | ICD-10-CM | POA: Diagnosis not present

## 2020-05-09 DIAGNOSIS — L89626 Pressure-induced deep tissue damage of left heel: Secondary | ICD-10-CM | POA: Diagnosis not present

## 2020-05-09 DIAGNOSIS — M6281 Muscle weakness (generalized): Secondary | ICD-10-CM | POA: Diagnosis not present

## 2020-05-15 DIAGNOSIS — M6281 Muscle weakness (generalized): Secondary | ICD-10-CM | POA: Diagnosis not present

## 2020-05-15 DIAGNOSIS — S72141D Displaced intertrochanteric fracture of right femur, subsequent encounter for closed fracture with routine healing: Secondary | ICD-10-CM | POA: Diagnosis not present

## 2020-05-16 DIAGNOSIS — M6281 Muscle weakness (generalized): Secondary | ICD-10-CM | POA: Diagnosis not present

## 2020-05-16 DIAGNOSIS — L89616 Pressure-induced deep tissue damage of right heel: Secondary | ICD-10-CM | POA: Diagnosis not present

## 2020-05-16 DIAGNOSIS — L89626 Pressure-induced deep tissue damage of left heel: Secondary | ICD-10-CM | POA: Diagnosis not present

## 2020-05-18 DIAGNOSIS — Z4789 Encounter for other orthopedic aftercare: Secondary | ICD-10-CM | POA: Diagnosis not present

## 2020-05-23 DIAGNOSIS — L89616 Pressure-induced deep tissue damage of right heel: Secondary | ICD-10-CM | POA: Diagnosis not present

## 2020-05-23 DIAGNOSIS — L89624 Pressure ulcer of left heel, stage 4: Secondary | ICD-10-CM | POA: Diagnosis not present

## 2020-05-23 DIAGNOSIS — L89626 Pressure-induced deep tissue damage of left heel: Secondary | ICD-10-CM | POA: Diagnosis not present

## 2020-05-23 DIAGNOSIS — L89613 Pressure ulcer of right heel, stage 3: Secondary | ICD-10-CM | POA: Diagnosis not present

## 2020-05-23 DIAGNOSIS — M6281 Muscle weakness (generalized): Secondary | ICD-10-CM | POA: Diagnosis not present

## 2020-05-31 DIAGNOSIS — M6281 Muscle weakness (generalized): Secondary | ICD-10-CM | POA: Diagnosis not present

## 2020-05-31 DIAGNOSIS — L89616 Pressure-induced deep tissue damage of right heel: Secondary | ICD-10-CM | POA: Diagnosis not present

## 2020-05-31 DIAGNOSIS — L89626 Pressure-induced deep tissue damage of left heel: Secondary | ICD-10-CM | POA: Diagnosis not present

## 2020-06-05 DIAGNOSIS — L89624 Pressure ulcer of left heel, stage 4: Secondary | ICD-10-CM | POA: Diagnosis not present

## 2020-06-07 DIAGNOSIS — G309 Alzheimer's disease, unspecified: Secondary | ICD-10-CM | POA: Diagnosis not present

## 2020-06-07 DIAGNOSIS — Z720 Tobacco use: Secondary | ICD-10-CM | POA: Diagnosis not present

## 2020-06-07 DIAGNOSIS — Z681 Body mass index (BMI) 19 or less, adult: Secondary | ICD-10-CM | POA: Diagnosis not present

## 2020-06-07 DIAGNOSIS — R451 Restlessness and agitation: Secondary | ICD-10-CM | POA: Diagnosis not present

## 2020-06-07 DIAGNOSIS — L89626 Pressure-induced deep tissue damage of left heel: Secondary | ICD-10-CM | POA: Diagnosis not present

## 2020-06-07 DIAGNOSIS — R410 Disorientation, unspecified: Secondary | ICD-10-CM | POA: Diagnosis not present

## 2020-06-07 DIAGNOSIS — E43 Unspecified severe protein-calorie malnutrition: Secondary | ICD-10-CM | POA: Diagnosis not present

## 2020-06-07 DIAGNOSIS — D510 Vitamin B12 deficiency anemia due to intrinsic factor deficiency: Secondary | ICD-10-CM | POA: Diagnosis not present

## 2020-06-07 DIAGNOSIS — L89616 Pressure-induced deep tissue damage of right heel: Secondary | ICD-10-CM | POA: Diagnosis not present

## 2020-06-07 DIAGNOSIS — M6281 Muscle weakness (generalized): Secondary | ICD-10-CM | POA: Diagnosis not present

## 2020-06-09 DIAGNOSIS — E785 Hyperlipidemia, unspecified: Secondary | ICD-10-CM | POA: Diagnosis not present

## 2020-06-09 DIAGNOSIS — Z139 Encounter for screening, unspecified: Secondary | ICD-10-CM | POA: Diagnosis not present

## 2020-06-09 DIAGNOSIS — D510 Vitamin B12 deficiency anemia due to intrinsic factor deficiency: Secondary | ICD-10-CM | POA: Diagnosis not present

## 2020-06-10 DIAGNOSIS — L89626 Pressure-induced deep tissue damage of left heel: Secondary | ICD-10-CM | POA: Diagnosis not present

## 2020-06-10 DIAGNOSIS — M6281 Muscle weakness (generalized): Secondary | ICD-10-CM | POA: Diagnosis not present

## 2020-06-10 DIAGNOSIS — L89616 Pressure-induced deep tissue damage of right heel: Secondary | ICD-10-CM | POA: Diagnosis not present

## 2020-06-13 DIAGNOSIS — R451 Restlessness and agitation: Secondary | ICD-10-CM | POA: Diagnosis not present

## 2020-06-13 DIAGNOSIS — G309 Alzheimer's disease, unspecified: Secondary | ICD-10-CM | POA: Diagnosis not present

## 2020-06-14 DIAGNOSIS — L89624 Pressure ulcer of left heel, stage 4: Secondary | ICD-10-CM | POA: Diagnosis not present

## 2020-06-14 DIAGNOSIS — M6281 Muscle weakness (generalized): Secondary | ICD-10-CM | POA: Diagnosis not present

## 2020-06-14 DIAGNOSIS — L89613 Pressure ulcer of right heel, stage 3: Secondary | ICD-10-CM | POA: Diagnosis not present

## 2020-06-14 DIAGNOSIS — S72141D Displaced intertrochanteric fracture of right femur, subsequent encounter for closed fracture with routine healing: Secondary | ICD-10-CM | POA: Diagnosis not present

## 2020-06-21 DIAGNOSIS — M6281 Muscle weakness (generalized): Secondary | ICD-10-CM | POA: Diagnosis not present

## 2020-06-21 DIAGNOSIS — L89613 Pressure ulcer of right heel, stage 3: Secondary | ICD-10-CM | POA: Diagnosis not present

## 2020-06-21 DIAGNOSIS — L89624 Pressure ulcer of left heel, stage 4: Secondary | ICD-10-CM | POA: Diagnosis not present

## 2021-07-03 ENCOUNTER — Encounter: Payer: Self-pay | Admitting: *Deleted

## 2021-08-06 IMAGING — DX DG CHEST 1V
2 series · 2 of 2 positions shown · non-contrast
Comparison: September 01, 2018

CLINICAL DATA: Fall

EXAM:
CHEST  1 VIEW

[chest ap (1 of 2)]
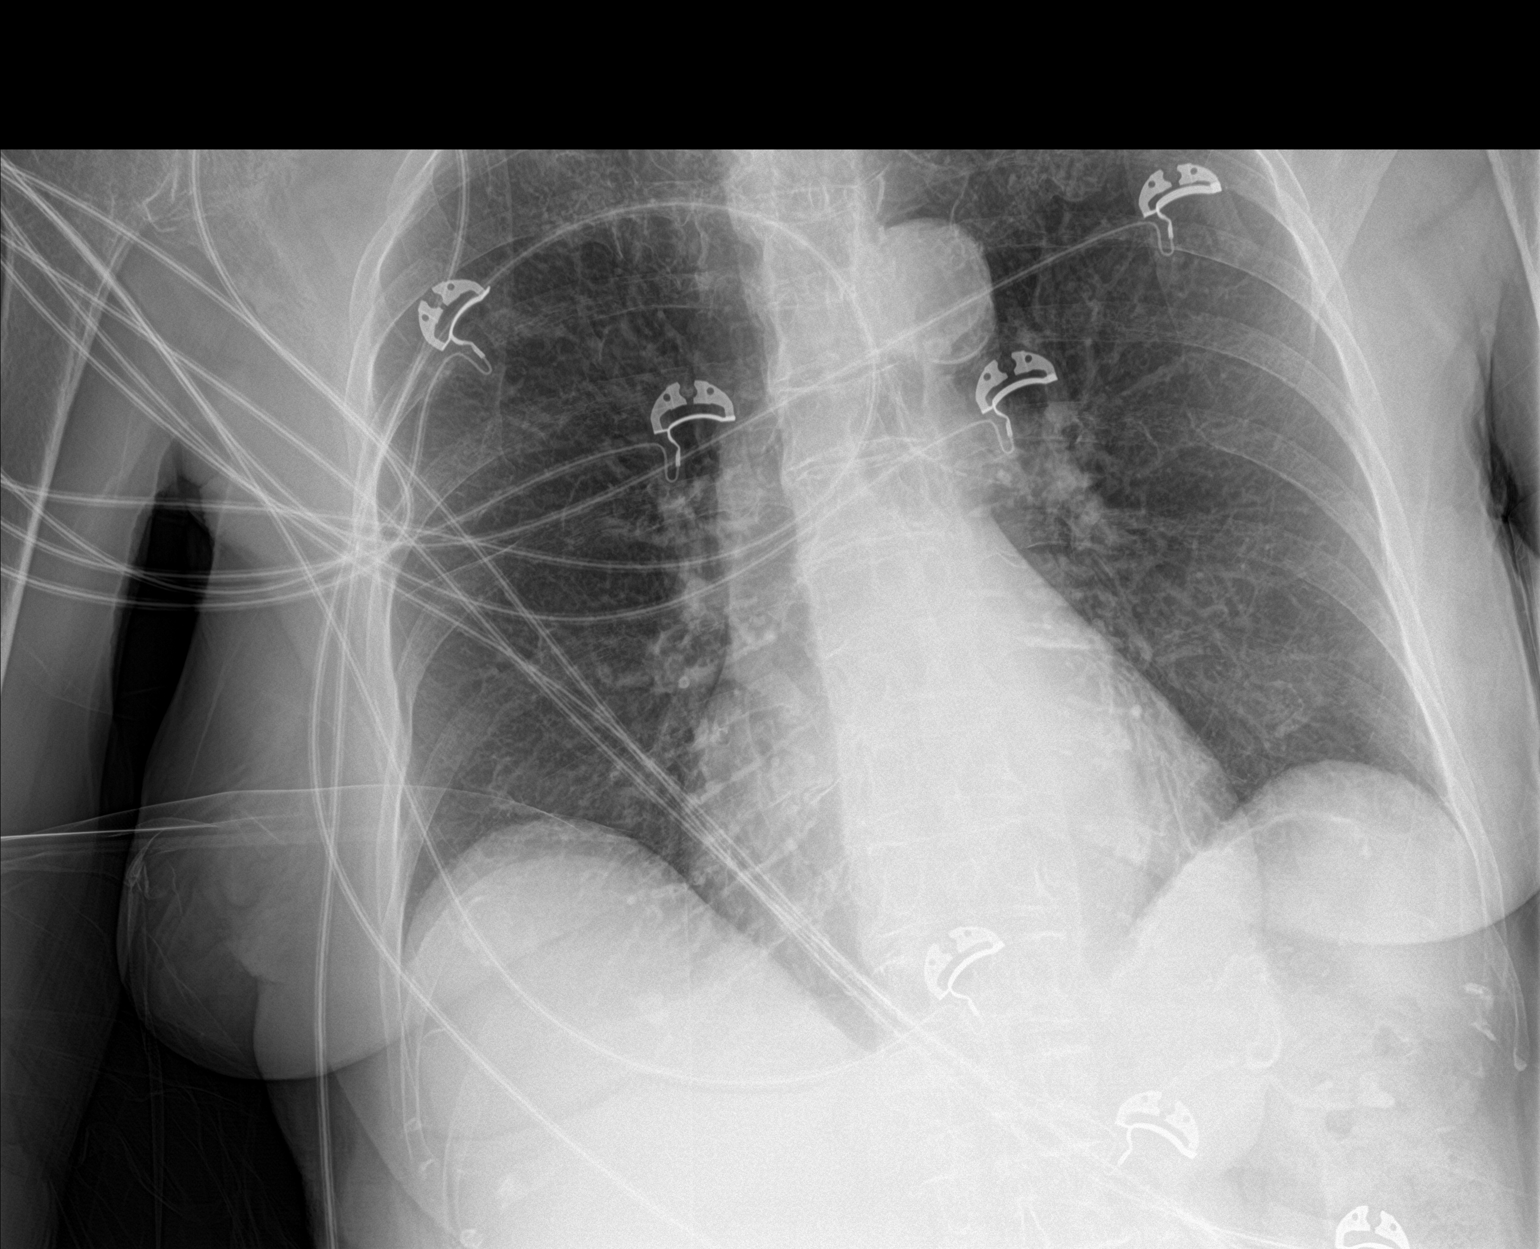

[chest ap (2 of 2)]
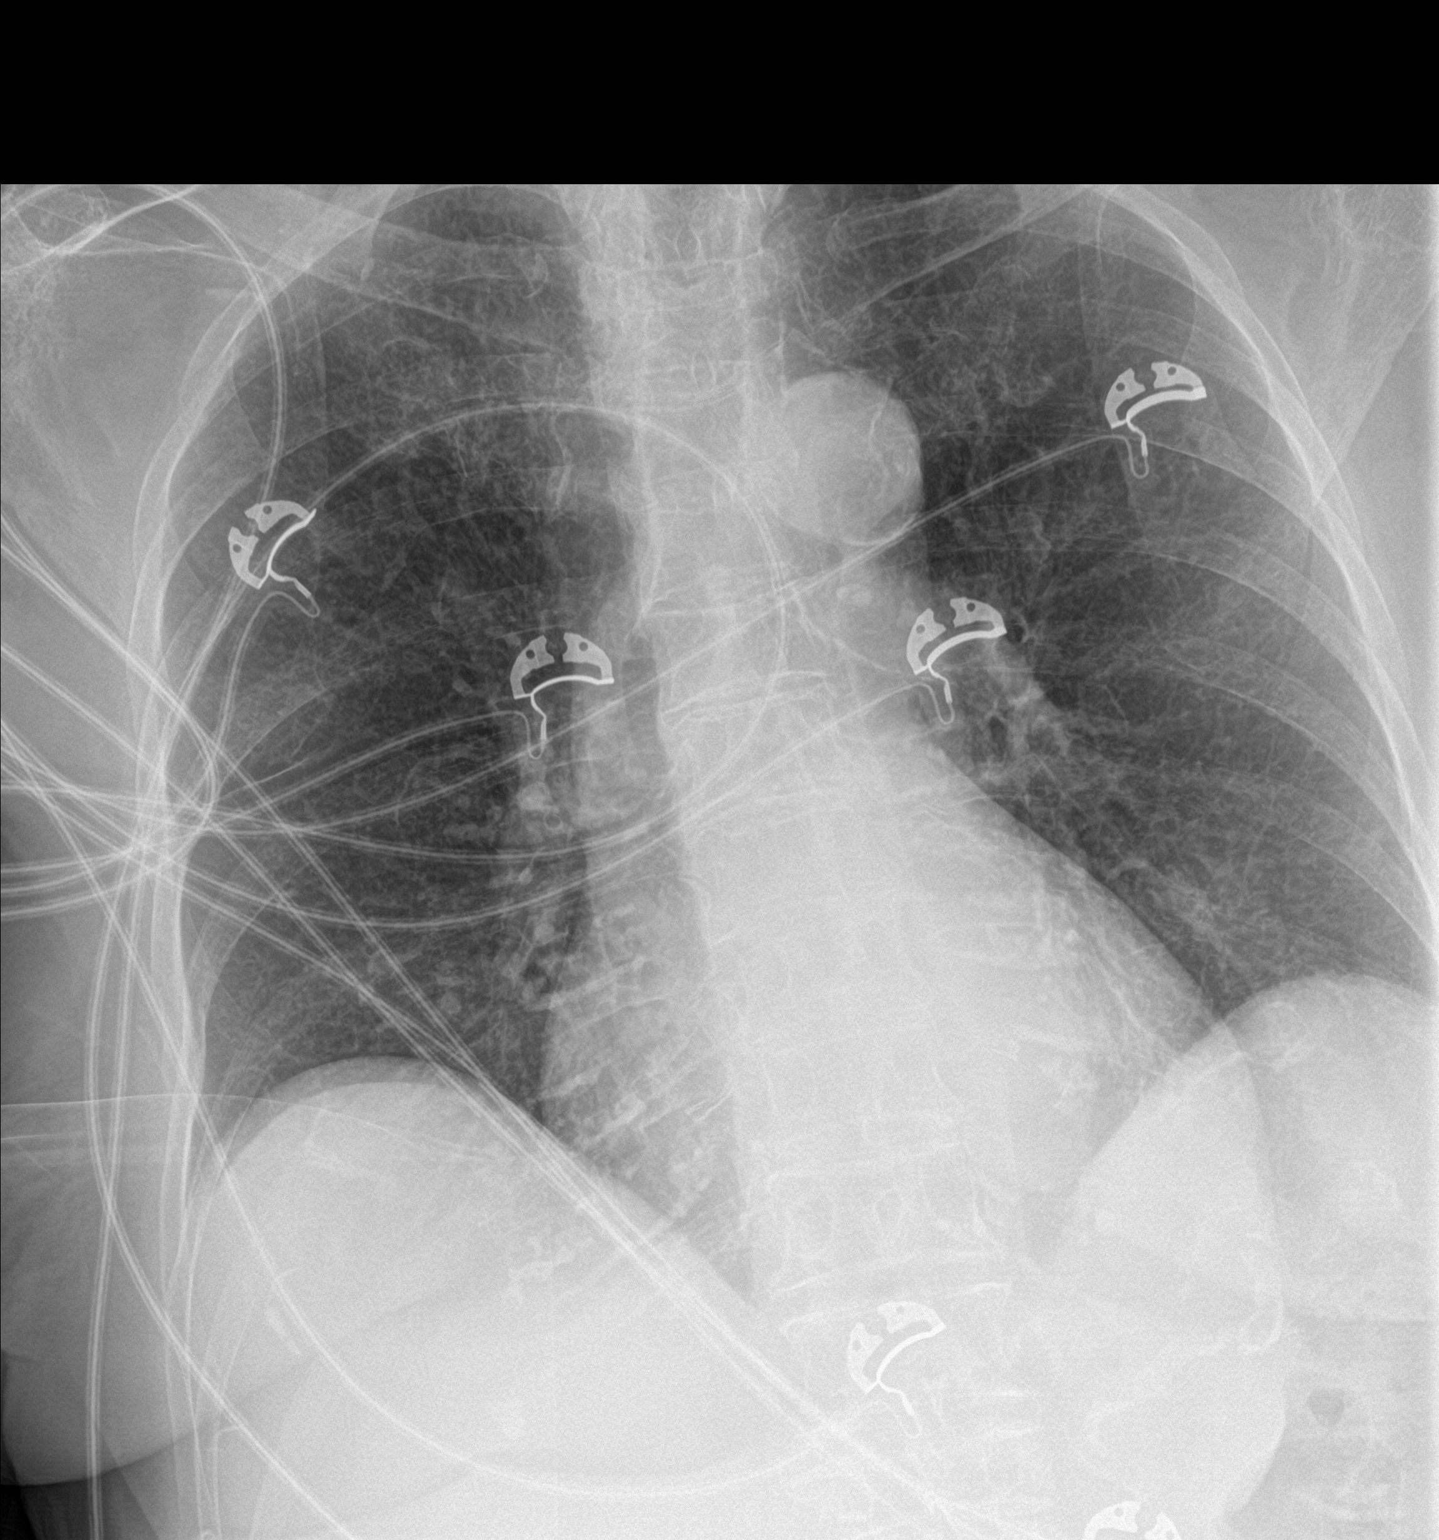

[2 of 2 positions shown; findings below may reference images not displayed]

FINDINGS: The cardiomediastinal silhouette is unchanged in
contour.Atherosclerotic calcifications of the aorta. No pleural
effusion. No pneumothorax. No acute pleuroparenchymal abnormality.
Visualized abdomen is unremarkable. Unchanged contours of the RIGHT
ribs consistent with sequela of remote prior fractures.
IMPRESSION: No acute cardiopulmonary abnormality.

## 2021-08-06 IMAGING — DX DG FEMUR 2+V*R*
4 series · 4 of 4 positions shown · non-contrast
Comparison: January 15, 2019

CLINICAL DATA: Fall

EXAM:
PELVIS - 1-2 VIEW; RIGHT FEMUR 2 VIEWS

[femur ap (1 of 2)]
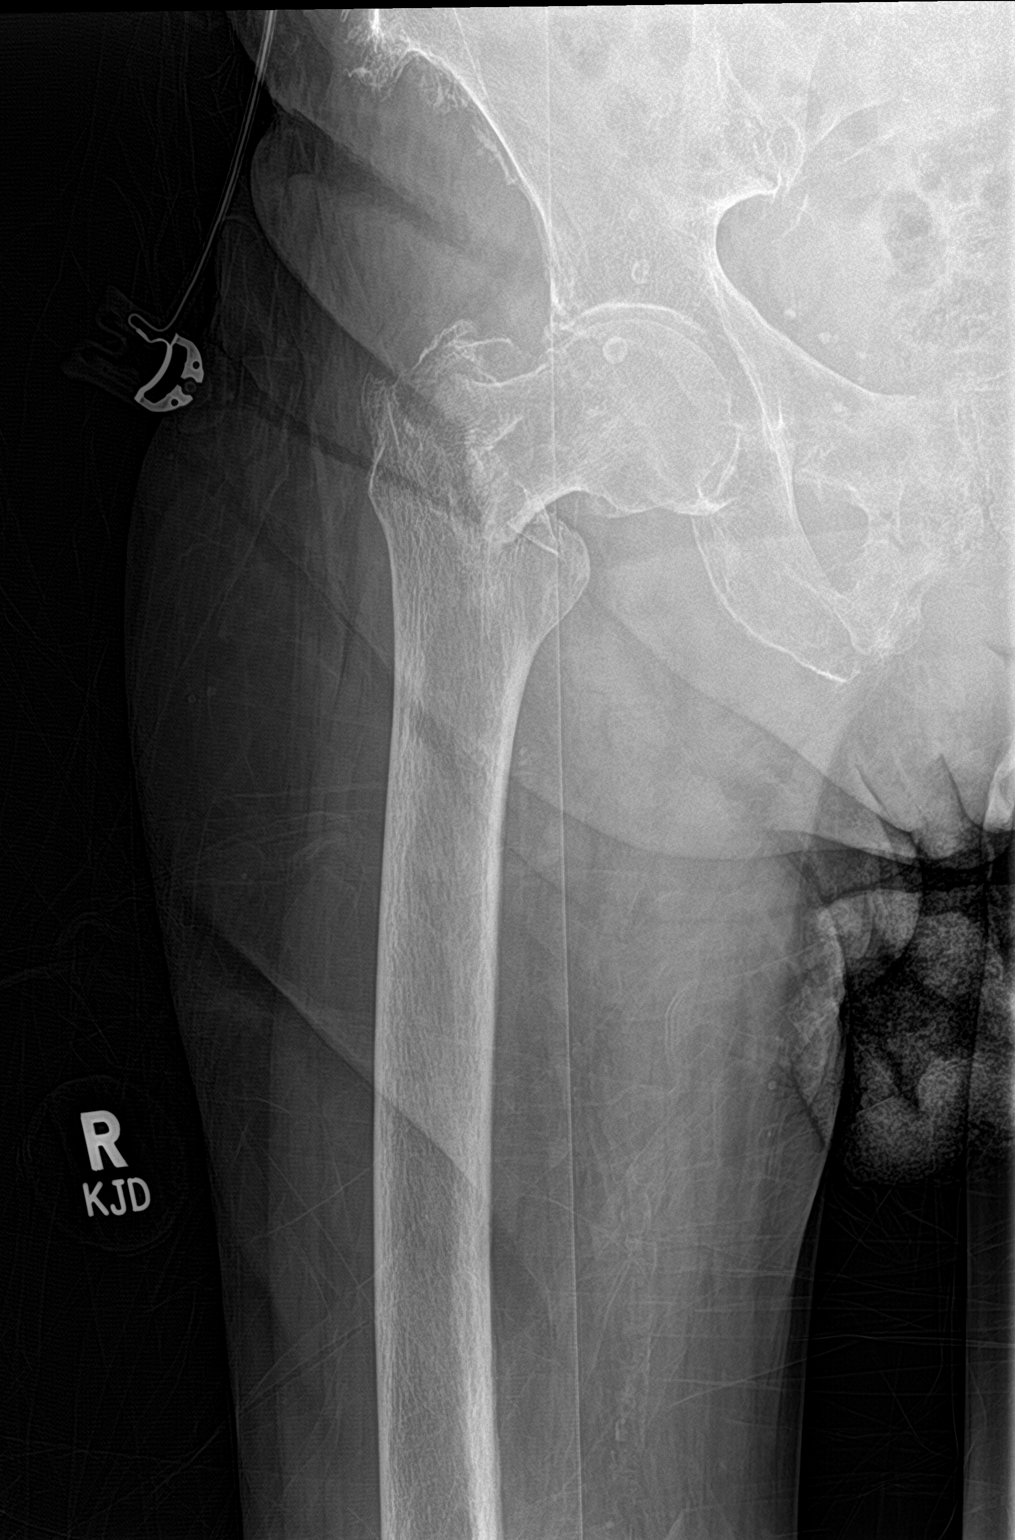

[femur ap (2 of 2)]
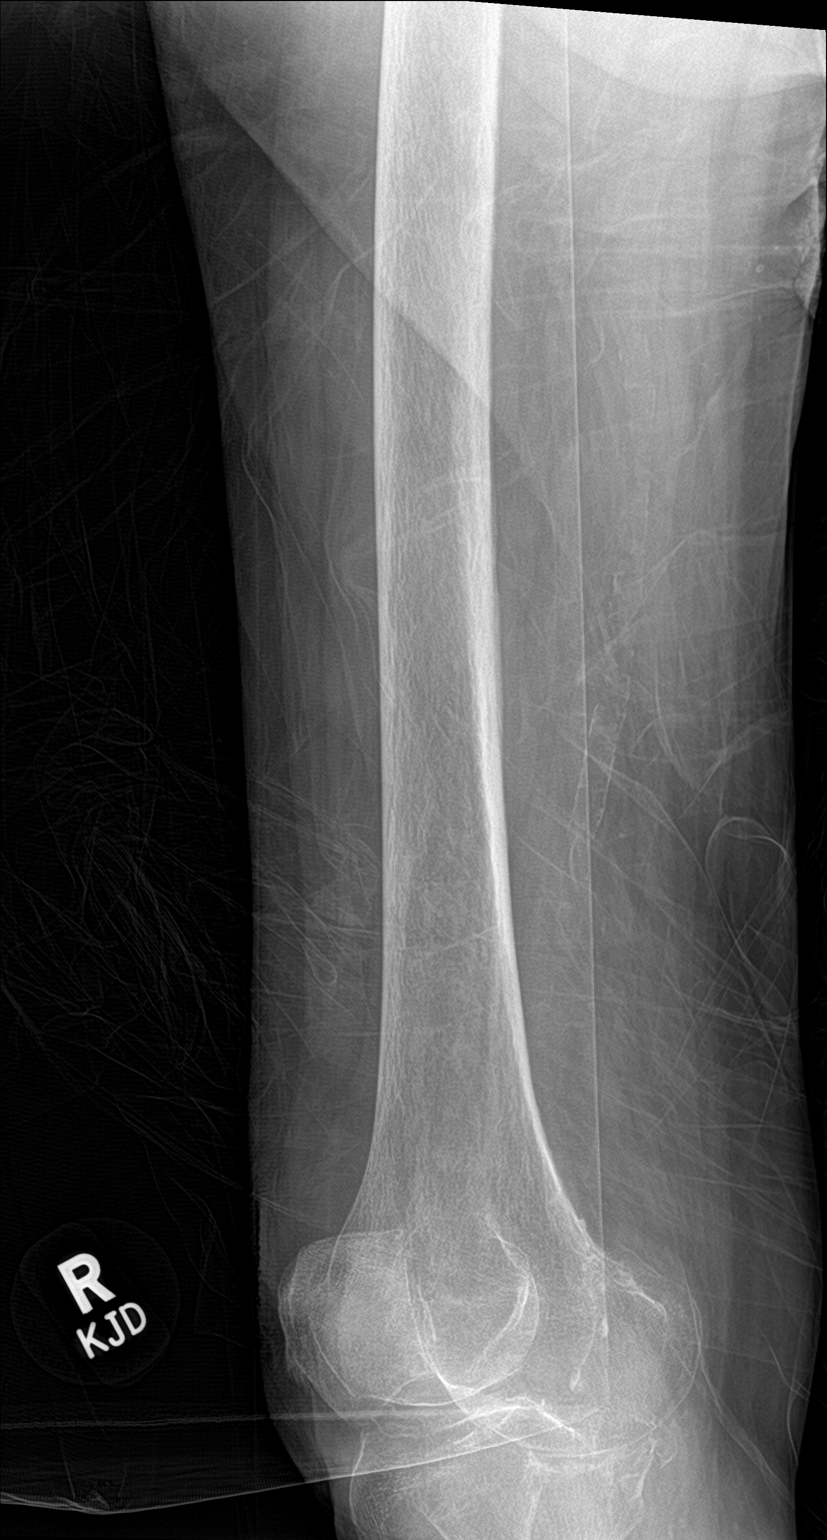

[femur lat (1 of 2)]
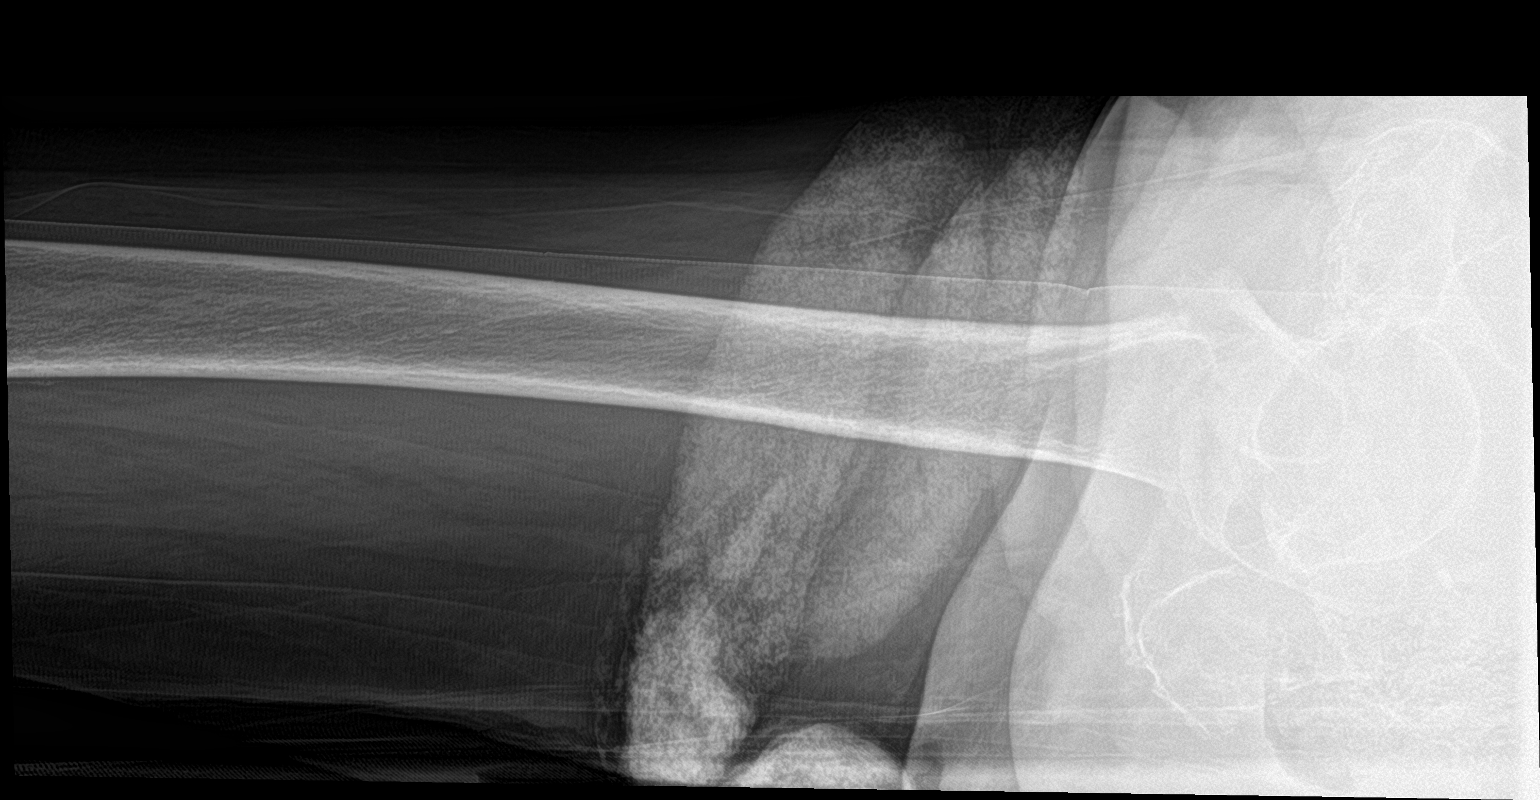

[femur lat (2 of 2)]
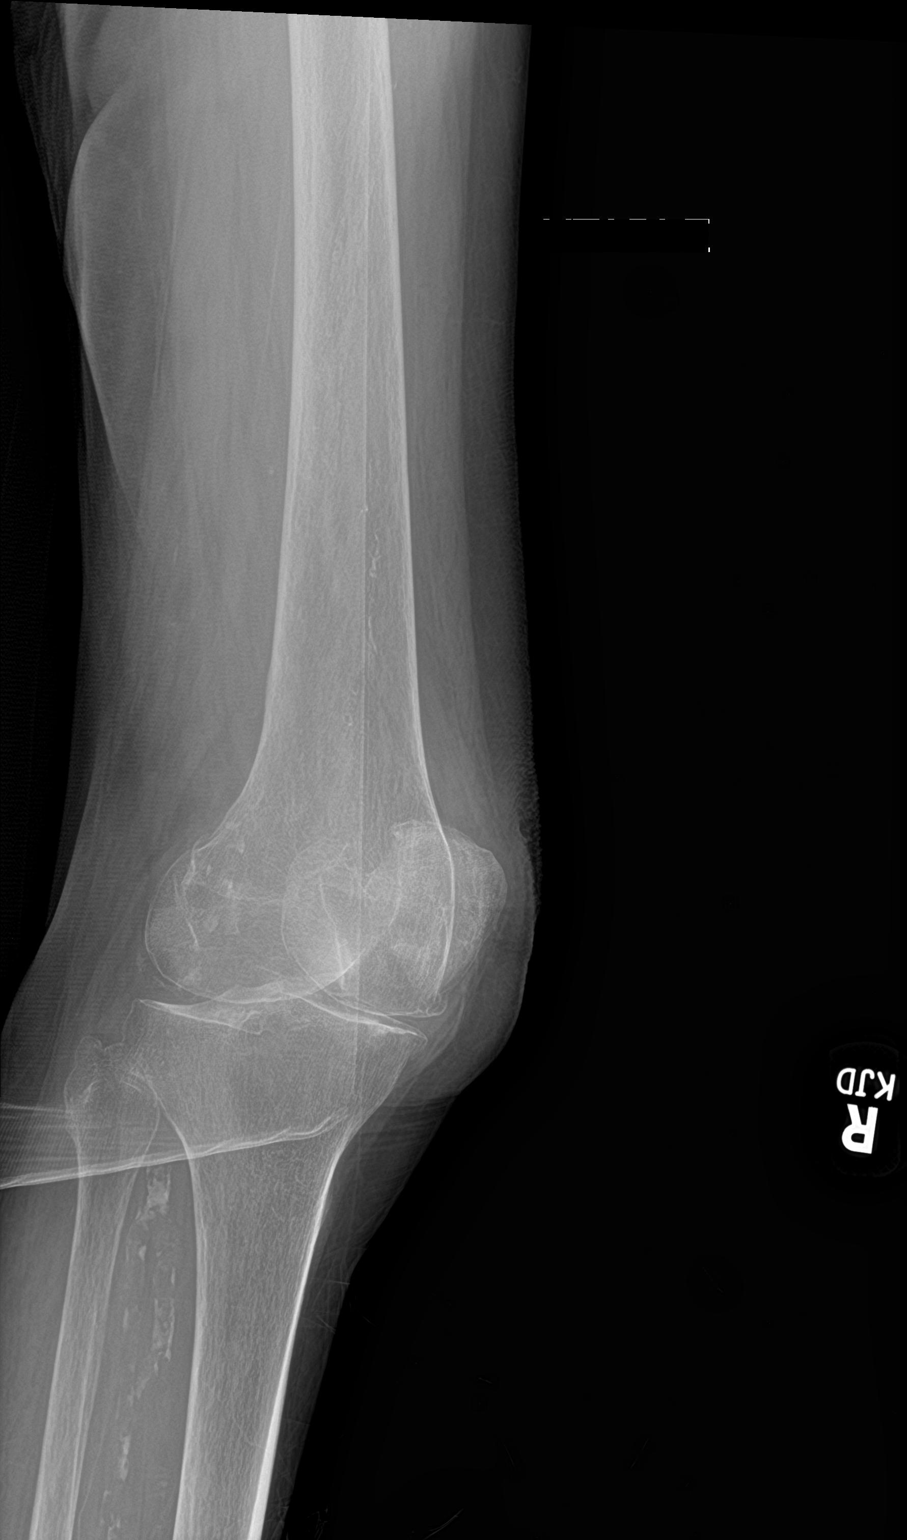

[4 of 4 positions shown; findings below may reference images not displayed]

FINDINGS: Osteopenia. There is a comminuted fracture of the inter trochanteric
RIGHT femur. Femoral head appears to be seated within the
acetabulum. There is mild apex lateral angulation of the fracture.
There is questionable lucency traversing the RIGHT inferior pubic
ramus, limited by overlying stool. Vascular calcifications. Pelvic
phleboliths. Degenerative changes of the lower lumbar spine.
Degenerative changes of the pubic symphysis.
IMPRESSION: 1. Comminuted fracture of the intertrochanteric RIGHT femur with
mild apex lateral angulation.
2. Possible lucency traversing the RIGHT inferior pubic ramus which
may reflect a nondisplaced fracture. Evaluation is limited secondary
to overlying stool.

## 2022-10-29 DEATH — deceased
